# Patient Record
Sex: Female | Born: 1960 | Race: Black or African American | Hispanic: No | Marital: Single | Smoking: Never smoker
Health system: Southern US, Community
[De-identification: ages and names within clinical notes are randomized; demographics above are authoritative.]

## PROBLEM LIST (undated history)

## (undated) DIAGNOSIS — M1990 Unspecified osteoarthritis, unspecified site: Secondary | ICD-10-CM

## (undated) DIAGNOSIS — Z86018 Personal history of other benign neoplasm: Secondary | ICD-10-CM

## (undated) DIAGNOSIS — D509 Iron deficiency anemia, unspecified: Secondary | ICD-10-CM

## (undated) DIAGNOSIS — H9190 Unspecified hearing loss, unspecified ear: Secondary | ICD-10-CM

## (undated) DIAGNOSIS — K6282 Dysplasia of anus: Secondary | ICD-10-CM

## (undated) DIAGNOSIS — I10 Essential (primary) hypertension: Secondary | ICD-10-CM

## (undated) DIAGNOSIS — E119 Type 2 diabetes mellitus without complications: Secondary | ICD-10-CM

## (undated) DIAGNOSIS — E538 Deficiency of other specified B group vitamins: Secondary | ICD-10-CM

## (undated) DIAGNOSIS — Z8669 Personal history of other diseases of the nervous system and sense organs: Secondary | ICD-10-CM

## (undated) DIAGNOSIS — T7840XA Allergy, unspecified, initial encounter: Secondary | ICD-10-CM

## (undated) DIAGNOSIS — Z87898 Personal history of other specified conditions: Secondary | ICD-10-CM

## (undated) HISTORY — DX: Personal history of other specified conditions: Z87.898

## (undated) HISTORY — DX: Personal history of other diseases of the nervous system and sense organs: Z86.69

## (undated) HISTORY — PX: TONSILLECTOMY: SUR1361

## (undated) HISTORY — PX: MYOMECTOMY ABDOMINAL APPROACH: SUR870

## (undated) HISTORY — PX: LAPAROSCOPIC ASSISTED VAGINAL HYSTERECTOMY: SHX5398

## (undated) HISTORY — DX: Allergy, unspecified, initial encounter: T78.40XA

## (undated) HISTORY — PX: LEEP: SHX91

## (undated) HISTORY — DX: Unspecified hearing loss, unspecified ear: H91.90

## (undated) HISTORY — DX: Unspecified osteoarthritis, unspecified site: M19.90

## (undated) HISTORY — PX: CARDIOVASCULAR STRESS TEST: SHX262

## (undated) HISTORY — PX: COLONOSCOPY WITH ESOPHAGOGASTRODUODENOSCOPY (EGD): SHX5779

---

## 1980-04-01 HISTORY — PX: BUNIONECTOMY: SHX129

## 1999-12-24 ENCOUNTER — Other Ambulatory Visit: Admission: RE | Admit: 1999-12-24 | Discharge: 1999-12-24 | Payer: Self-pay | Admitting: *Deleted

## 2000-10-01 ENCOUNTER — Other Ambulatory Visit: Admission: RE | Admit: 2000-10-01 | Discharge: 2000-10-01 | Payer: Self-pay | Admitting: Obstetrics and Gynecology

## 2000-10-01 ENCOUNTER — Encounter (INDEPENDENT_AMBULATORY_CARE_PROVIDER_SITE_OTHER): Payer: Self-pay | Admitting: Specialist

## 2001-06-29 ENCOUNTER — Other Ambulatory Visit: Admission: RE | Admit: 2001-06-29 | Discharge: 2001-06-29 | Payer: Self-pay | Admitting: Obstetrics and Gynecology

## 2001-09-04 ENCOUNTER — Encounter (INDEPENDENT_AMBULATORY_CARE_PROVIDER_SITE_OTHER): Payer: Self-pay

## 2001-09-04 ENCOUNTER — Inpatient Hospital Stay (HOSPITAL_COMMUNITY): Admission: AD | Admit: 2001-09-04 | Discharge: 2001-09-06 | Payer: Self-pay | Admitting: Obstetrics and Gynecology

## 2002-07-23 ENCOUNTER — Other Ambulatory Visit: Admission: RE | Admit: 2002-07-23 | Discharge: 2002-07-23 | Payer: Self-pay | Admitting: Obstetrics and Gynecology

## 2002-07-30 ENCOUNTER — Ambulatory Visit (HOSPITAL_COMMUNITY): Admission: RE | Admit: 2002-07-30 | Discharge: 2002-07-30 | Payer: Self-pay | Admitting: Obstetrics and Gynecology

## 2002-07-30 ENCOUNTER — Encounter: Payer: Self-pay | Admitting: Obstetrics and Gynecology

## 2004-12-30 ENCOUNTER — Emergency Department: Payer: Self-pay | Admitting: Emergency Medicine

## 2005-07-02 ENCOUNTER — Ambulatory Visit: Payer: Self-pay | Admitting: Family Medicine

## 2005-07-19 ENCOUNTER — Ambulatory Visit (HOSPITAL_COMMUNITY): Admission: RE | Admit: 2005-07-19 | Discharge: 2005-07-19 | Payer: Self-pay | Admitting: Obstetrics & Gynecology

## 2005-08-13 ENCOUNTER — Ambulatory Visit: Payer: Self-pay | Admitting: Family Medicine

## 2005-09-18 ENCOUNTER — Encounter (INDEPENDENT_AMBULATORY_CARE_PROVIDER_SITE_OTHER): Payer: Self-pay | Admitting: *Deleted

## 2005-09-18 ENCOUNTER — Ambulatory Visit: Payer: Self-pay | Admitting: Family Medicine

## 2005-09-19 ENCOUNTER — Inpatient Hospital Stay (HOSPITAL_COMMUNITY): Admission: RE | Admit: 2005-09-19 | Discharge: 2005-09-20 | Payer: Self-pay | Admitting: Family Medicine

## 2005-10-08 ENCOUNTER — Ambulatory Visit: Payer: Self-pay | Admitting: Family Medicine

## 2005-11-01 ENCOUNTER — Ambulatory Visit: Payer: Self-pay | Admitting: Family Medicine

## 2005-11-04 ENCOUNTER — Ambulatory Visit: Payer: Self-pay | Admitting: Family Medicine

## 2005-11-12 ENCOUNTER — Ambulatory Visit: Payer: Self-pay | Admitting: Family Medicine

## 2005-11-18 ENCOUNTER — Ambulatory Visit: Payer: Self-pay | Admitting: Family Medicine

## 2006-02-10 ENCOUNTER — Ambulatory Visit: Payer: Self-pay | Admitting: Gynecology

## 2006-02-14 ENCOUNTER — Ambulatory Visit: Payer: Self-pay | Admitting: Family Medicine

## 2006-02-14 LAB — CONVERTED CEMR LAB: Hgb A1c MFr Bld: 6.8 %

## 2006-02-21 ENCOUNTER — Ambulatory Visit (HOSPITAL_COMMUNITY): Admission: RE | Admit: 2006-02-21 | Discharge: 2006-02-21 | Payer: Self-pay | Admitting: Family Medicine

## 2006-05-29 ENCOUNTER — Ambulatory Visit: Payer: Self-pay | Admitting: Family Medicine

## 2006-10-21 ENCOUNTER — Ambulatory Visit: Payer: Self-pay | Admitting: Family Medicine

## 2006-10-24 ENCOUNTER — Ambulatory Visit (HOSPITAL_COMMUNITY): Admission: RE | Admit: 2006-10-24 | Discharge: 2006-10-24 | Payer: Self-pay | Admitting: Gynecology

## 2007-02-04 ENCOUNTER — Encounter: Payer: Self-pay | Admitting: Family Medicine

## 2007-03-02 HISTORY — PX: NASAL ENDOSCOPY: SHX286

## 2007-03-19 ENCOUNTER — Ambulatory Visit: Payer: Self-pay | Admitting: Gastroenterology

## 2007-03-19 ENCOUNTER — Encounter: Payer: Self-pay | Admitting: Family Medicine

## 2007-07-30 ENCOUNTER — Ambulatory Visit: Payer: Self-pay | Admitting: Gynecology

## 2008-01-26 ENCOUNTER — Ambulatory Visit: Payer: Self-pay | Admitting: Family Medicine

## 2008-01-26 DIAGNOSIS — D509 Iron deficiency anemia, unspecified: Secondary | ICD-10-CM | POA: Insufficient documentation

## 2008-01-26 DIAGNOSIS — E119 Type 2 diabetes mellitus without complications: Secondary | ICD-10-CM

## 2008-03-28 ENCOUNTER — Ambulatory Visit: Payer: Self-pay | Admitting: Family Medicine

## 2008-03-29 LAB — CONVERTED CEMR LAB
ALT: 14 units/L (ref 0–35)
Albumin: 3.5 g/dL (ref 3.5–5.2)
Chloride: 110 meq/L (ref 96–112)
Eosinophils Absolute: 0 10*3/uL (ref 0.0–0.7)
Eosinophils Relative: 0.8 % (ref 0.0–5.0)
GFR calc Af Amer: 138 mL/min
GFR calc non Af Amer: 114 mL/min
HCT: 37.1 % (ref 36.0–46.0)
HDL: 49.5 mg/dL (ref >39.0)
Hemoglobin: 12.5 g/dL (ref 12.0–15.0)
Hgb A1c MFr Bld: 6.2 % — ABNORMAL HIGH (ref 4.6–6.0)
MCV: 82.8 fL (ref 78.0–100.0)
Monocytes Absolute: 0.4 10*3/uL (ref 0.1–1.0)
Monocytes Relative: 10.8 % (ref 3.0–12.0)
Neutro Abs: 2.1 10*3/uL (ref 1.4–7.7)
Phosphorus: 3.1 mg/dL (ref 2.3–4.6)
Potassium: 4.3 meq/L (ref 3.5–5.1)
Sodium: 141 meq/L (ref 135–145)
Total CHOL/HDL Ratio: 3.2
Total Protein: 6.4 g/dL (ref 6.0–8.3)
Triglycerides: 47 mg/dL (ref 0–149)
WBC: 4.1 10*3/uL — ABNORMAL LOW (ref 4.5–10.5)

## 2008-06-02 ENCOUNTER — Encounter: Payer: Self-pay | Admitting: Family Medicine

## 2008-06-16 ENCOUNTER — Encounter: Payer: Self-pay | Admitting: Family Medicine

## 2008-10-05 ENCOUNTER — Ambulatory Visit: Payer: Self-pay | Admitting: Family Medicine

## 2008-10-05 DIAGNOSIS — D518 Other vitamin B12 deficiency anemias: Secondary | ICD-10-CM | POA: Insufficient documentation

## 2008-10-05 DIAGNOSIS — J309 Allergic rhinitis, unspecified: Secondary | ICD-10-CM | POA: Insufficient documentation

## 2008-10-05 DIAGNOSIS — R209 Unspecified disturbances of skin sensation: Secondary | ICD-10-CM

## 2008-10-05 DIAGNOSIS — R51 Headache: Secondary | ICD-10-CM

## 2008-10-05 DIAGNOSIS — R350 Frequency of micturition: Secondary | ICD-10-CM | POA: Insufficient documentation

## 2008-10-05 DIAGNOSIS — D696 Thrombocytopenia, unspecified: Secondary | ICD-10-CM | POA: Insufficient documentation

## 2008-10-05 LAB — CONVERTED CEMR LAB
Glucose, Urine, Semiquant: NEGATIVE
Protein, U semiquant: NEGATIVE
Specific Gravity, Urine: 1.015
WBC Urine, dipstick: NEGATIVE
pH: 7

## 2008-10-06 LAB — CONVERTED CEMR LAB
Albumin: 3.6 g/dL (ref 3.5–5.2)
BUN: 13 mg/dL (ref 6–23)
Basophils Relative: 0.2 % (ref 0.0–3.0)
Calcium: 9 mg/dL (ref 8.4–10.5)
Eosinophils Absolute: 0 10*3/uL (ref 0.0–0.7)
Glucose, Bld: 101 mg/dL — ABNORMAL HIGH (ref 70–99)
Hemoglobin: 12.6 g/dL (ref 12.0–15.0)
MCHC: 32.8 g/dL (ref 30.0–36.0)
MCV: 85.5 fL (ref 78.0–100.0)
Monocytes Absolute: 0.5 10*3/uL (ref 0.1–1.0)
Neutro Abs: 1.8 10*3/uL (ref 1.4–7.7)
Neutrophils Relative %: 45.7 % (ref 43.0–77.0)
Potassium: 4.3 meq/L (ref 3.5–5.1)
RBC: 4.5 M/uL (ref 3.87–5.11)

## 2008-10-07 ENCOUNTER — Ambulatory Visit: Payer: Self-pay | Admitting: Family Medicine

## 2008-10-12 ENCOUNTER — Ambulatory Visit: Payer: Self-pay | Admitting: Oncology

## 2008-10-21 ENCOUNTER — Ambulatory Visit: Payer: Self-pay | Admitting: Family Medicine

## 2008-11-04 ENCOUNTER — Ambulatory Visit: Payer: Self-pay | Admitting: Family Medicine

## 2008-11-18 ENCOUNTER — Ambulatory Visit: Payer: Self-pay | Admitting: Family Medicine

## 2008-11-29 ENCOUNTER — Ambulatory Visit: Payer: Self-pay | Admitting: Oncology

## 2008-12-01 ENCOUNTER — Ambulatory Visit: Payer: Self-pay | Admitting: Family Medicine

## 2008-12-01 LAB — CMP (CANCER CENTER ONLY)
Albumin: 3.8 g/dL (ref 3.3–5.5)
CO2: 27 mEq/L (ref 18–33)
Calcium: 9.2 mg/dL (ref 8.0–10.3)
Chloride: 105 mEq/L (ref 98–108)
Glucose, Bld: 168 mg/dL — ABNORMAL HIGH (ref 73–118)
Potassium: 3.8 mEq/L (ref 3.3–4.7)
Sodium: 139 mEq/L (ref 128–145)
Total Bilirubin: 0.5 mg/dl (ref 0.20–1.60)
Total Protein: 7.2 g/dL (ref 6.4–8.1)

## 2008-12-01 LAB — CBC WITH DIFFERENTIAL (CANCER CENTER ONLY)
BASO%: 0.8 % (ref 0.0–2.0)
Eosinophils Absolute: 0.1 10*3/uL (ref 0.0–0.5)
MONO#: 0.3 10*3/uL (ref 0.1–0.9)
NEUT#: 2.8 10*3/uL (ref 1.5–6.5)
Platelets: 149 10*3/uL (ref 145–400)
RBC: 4.89 10*6/uL (ref 3.70–5.32)
RDW: 12.5 % (ref 10.5–14.6)
WBC: 5 10*3/uL (ref 3.9–10.0)

## 2008-12-01 LAB — MORPHOLOGY - CHCC SATELLITE

## 2008-12-09 LAB — PLT GLYCOPROTEIN (INDIRECT) AUTOABS
Plt GP Ia/IIa Autoabs: NOT DETECTED
Plt Glyco IIb/IIIA Autoabs: NOT DETECTED
Plt Glycoprotein Ib/IX Autoabs: NOT DETECTED

## 2008-12-09 LAB — VITAMIN B12: Vitamin B-12: >2000 pg/mL — ABNORMAL HIGH (ref 211–911)

## 2008-12-09 LAB — FOLATE: Folate: >20 ng/mL

## 2008-12-16 ENCOUNTER — Ambulatory Visit: Payer: Self-pay | Admitting: Family Medicine

## 2008-12-16 ENCOUNTER — Telehealth: Payer: Self-pay | Admitting: Family Medicine

## 2008-12-19 ENCOUNTER — Encounter: Payer: Self-pay | Admitting: Family Medicine

## 2008-12-19 ENCOUNTER — Ambulatory Visit: Payer: Self-pay | Admitting: Family Medicine

## 2009-05-26 ENCOUNTER — Ambulatory Visit: Payer: Self-pay | Admitting: Oncology

## 2009-07-06 ENCOUNTER — Ambulatory Visit: Payer: Self-pay | Admitting: Family Medicine

## 2009-07-07 ENCOUNTER — Telehealth: Payer: Self-pay | Admitting: Family Medicine

## 2010-04-10 ENCOUNTER — Ambulatory Visit
Admission: RE | Admit: 2010-04-10 | Discharge: 2010-04-10 | Payer: Self-pay | Source: Home / Self Care | Attending: Family Medicine | Admitting: Family Medicine

## 2010-04-10 ENCOUNTER — Telehealth: Payer: Self-pay | Admitting: Family Medicine

## 2010-04-10 DIAGNOSIS — K130 Diseases of lips: Secondary | ICD-10-CM | POA: Insufficient documentation

## 2010-04-10 LAB — CONVERTED CEMR LAB
Blood in Urine, dipstick: NEGATIVE
Glucose, Urine, Semiquant: NEGATIVE
Ketones, urine, test strip: NEGATIVE
Specific Gravity, Urine: 1.02
WBC Urine, dipstick: NEGATIVE
pH: 6

## 2010-04-27 ENCOUNTER — Emergency Department: Payer: Self-pay | Admitting: Emergency Medicine

## 2010-05-01 NOTE — Assessment & Plan Note (Signed)
Summary: sinus infection/alc   Vital Signs:  Patient profile:   50 year old female Height:      64 inches Weight:      196.38 pounds BMI:     33.83 Temp:     98.8 degrees F oral Pulse rate:   88 / minute Pulse rhythm:   regular BP sitting:   122 / 82  (left arm) Cuff size:   regular  Vitals Entered By: Delilah Shan CMA Duncan Dull) (July 06, 2009 3:00 PM) CC: ? sinus infection   History of Present Illness: 50 yo with allergic rhinitis with over 2 weeks of worsening congestion and sinus pressure. Taking sinus excedrin and Mucinex, not helping. Felt feverish last night. Has dry cough. No wheezing or shortness of breath. Lips are chapped which is really bothering her, no ulcers or lesions on her lips.  Current Medications (verified): 1)  Flonase 50 Mcg/act Susp (Fluticasone Propionate) .... 2 Sprays in Each Nostril Daily 2)  Augmentin 875-125 Mg Tabs (Amoxicillin-Pot Clavulanate) .Marland Kitchen.. 1 By Mouth 2 Times Daily X 10 Days  Allergies (verified): No Known Drug Allergies  Review of Systems      See HPI General:  Complains of chills and fever. ENT:  Complains of nasal congestion, postnasal drainage, sinus pressure, and sore throat. CV:  Denies chest pain or discomfort. Resp:  Complains of cough; denies shortness of breath, sputum productive, and wheezing.  Physical Exam  General:  overweight but generally well appearing  Nose:  no nasal discharge.  - nares are boggy erythema right>left +sinus  Mouth:  pharyngeal erythema.   Lungs:  Normal respiratory effort, chest expands symmetrically. Lungs are clear to auscultation, no crackles or wheezes. Heart:  Normal rate and regular rhythm. S1 and S2 normal without gallop, murmur, click, rub or other extra sounds. Psych:  normal affect, talkative and pleasant - does not seem anxious or stressed    Impression & Recommendations:  Problem # 1:  ACUTE SINUSITIS, UNSPECIFIED (ICD-461.9) Assessment New Given duration and progression of  symptoms, will treat with augmentin. Continue Flonase daily. Ibuprofen as needed. See pt instructions for details. Her updated medication list for this problem includes:    Flonase 50 Mcg/act Susp (Fluticasone propionate) .Marland Kitchen... 2 sprays in each nostril daily    Augmentin 875-125 Mg Tabs (Amoxicillin-pot clavulanate) .Marland Kitchen... 1 by mouth 2 times daily x 10 days  Complete Medication List: 1)  Flonase 50 Mcg/act Susp (Fluticasone propionate) .... 2 sprays in each nostril daily 2)  Augmentin 875-125 Mg Tabs (Amoxicillin-pot clavulanate) .Marland Kitchen.. 1 by mouth 2 times daily x 10 days  Patient Instructions: 1)  Take antibiotic as directed.  Drink lots of fluids.  Treat sympotmatically with Mucinex, nasal saline irrigation, and Tylenol/Ibuprofen. Cough suppressant at night. Call if not improving as expected in 5-7 days.  Prescriptions: AUGMENTIN 875-125 MG TABS (AMOXICILLIN-POT CLAVULANATE) 1 by mouth 2 times daily x 10 days  #20 x 0   Entered and Authorized by:   Ruthe Mannan MD   Signed by:   Ruthe Mannan MD on 07/06/2009   Method used:   Electronically to        [id number] ([facility]ail[facility]       [address]       [place], [place]  [address]       Ph: [phone] or [phone]       Fax: [phone]   RxID:  [id number]   Current Allergies (reviewed today): No known allergies

## 2010-05-01 NOTE — Progress Notes (Signed)
Summary: ? allergic reaction  Phone Note Call from Patient Call back at [phone]   Caller: Patient Call For: Dr. Dayton Martes Summary of Call: Pt was seen yesterday for sinus infection and was given augmentin.  She states she has whelpy itchy rash this morning.  Please send something else to [facility].  I will update allergy list. Initial call taken by: Lowella Petties CMA,  July 07, 2009 9:18 AM   New Allergies: ! AUGMENTIN New/Updated Medications: AZITHROMYCIN 250 MG  TABS (AZITHROMYCIN) 2 by  mouth today and then 1 daily for 4 days New Allergies: ! AUGMENTINPrescriptions: AZITHROMYCIN 250 MG  TABS (AZITHROMYCIN) 2 by  mouth today and then 1 daily for 4 days  #6 x 0   Entered and Authorized by:   Ruthe Mannan MD   Signed by:   Ruthe Mannan MD on 07/07/2009   Method used:   [facility]        [id number]       [address]       [place], [place]  [address]       Ph: [phone] or [phone]       Fax: [phone]   RxID:   [id number]   Prior Medications: FLONASE 50 MCG/ACT SUSP (FLUTICASONE PROPIONATE) 2 sprays in each nostril daily Current Allergies: ! AUGMENTIN

## 2010-05-03 NOTE — Assessment & Plan Note (Signed)
Summary: COLD,?UTI/CLE   Vital Signs:  Patient profile:   50 year old female Height:      64 inches Weight:      200.50 pounds BMI:     34.54 Temp:     98.6 degrees F oral Pulse rate:   84 / minute Pulse rhythm:   regular BP sitting:   124 / 92  (left arm) Cuff size:   large  Vitals Entered By: Delilah Shan CMA Jamile Sivils Dull) (April 10, 2010 8:10 AM) CC: Cold.   ? UTI   History of Present Illness: No dysuria now.  Prev with UTI as below.  No symptoms now.  Ua unremarkable today.  Went to [facility] derm clinic for rash on arms/hives this summer.  Was found to have UTI and was started on septra.  Per patient, ANA, TSH, CBC, CMET was normal. Was asking if she still had a uti.    Chapped lips:  Lips started getting chapped back in the summer.  Started on aquaphor samples for chapped lips w/o relief per derm.   This episode of lip irritation started last month.  Lips prev cracked. No ulcers.  Diffusely tender to palpation.  Uses avon lipstick- changed lipstick w/o relief.  Taking flonase occ, no other new medicines except for allegra.  No FCNAVD.  No tongue changes, no oral changes o/w.  Swallowing well.  No other family members with similar symptoms.    Allergies: 1)  ! Augmentin  Review of Systems       See HPI.  Otherwise negative.    Physical Exam  General:  no apparent distress normocephalic atraumatic tm wnl, nasal exam wnl op wnl lips slighlty tender- diffusely, but no ulceration or focal change other than cracking in the lateral folds bilaterally.  no fluctuance. neck w/o la    Impression & Recommendations:  Problem # 1:  DISEASES OF LIPS (ICD-528.5) Unclear source.  I d/w patient's primary and advised her to follow up with derm and keep a log of symptoms/potential triggers in the meantime.  She understood.  No indication of current uti. follow up as needed.    Complete Medication List: 1)  Flonase 50 Mcg/act Susp (Fluticasone propionate) .... 2 sprays in each nostril  daily  Other Orders: UA Dipstick w/o Micro (manual) (04540)  Patient Instructions: 1)  I'll see if I can come up with any reasons for your chapped lips.  I would keep a log of symptoms to see if you can notice any patterns.  I would check back with the skin clinic.     Orders Added: 1)  Est. Patient Level III [98119] 2)  UA Dipstick w/o Micro (manual) [81002]    Current Allergies (reviewed today): ! AUGMENTIN  Laboratory Results   Urine Tests  Date/Time Received: April 10, 2010 8:49 AM   Routine Urinalysis   Color: yellow Appearance: Clear Glucose: negative   (Normal Range: Negative) Bilirubin: negative   (Normal Range: Negative) Ketone: negative   (Normal Range: Negative) Spec. Gravity: 1.020   (Normal Range: 1.003-1.035) Blood: negative   (Normal Range: Negative) pH: 6.0   (Normal Range: 5.0-8.0) Protein: negative   (Normal Range: Negative) Urobilinogen: 0.2   (Normal Range: 0-1) Nitrite: negative   (Normal Range: Negative) Leukocyte Esterace: negative   (Normal Range: Negative)

## 2010-05-03 NOTE — Progress Notes (Signed)
  Phone Note Outgoing Call   Summary of Call: please call patient.  I d/w Dr. Milinda Antis and we both think it would be reasonable to follow up with derm if lip symptoms continue.  thanks. Crawford Givens MD  April 10, 2010 8:48 PM   Follow-up for Phone Call        Left message on voicemail  in detail.  Personalized VM. Lugene Fuquay CMA Zamari Vea Dull)  April 11, 2010 9:55 AM

## 2010-06-04 ENCOUNTER — Ambulatory Visit (INDEPENDENT_AMBULATORY_CARE_PROVIDER_SITE_OTHER): Payer: 59 | Admitting: Family Medicine

## 2010-06-04 ENCOUNTER — Encounter: Payer: Self-pay | Admitting: Family Medicine

## 2010-06-04 DIAGNOSIS — J0100 Acute maxillary sinusitis, unspecified: Secondary | ICD-10-CM

## 2010-06-04 DIAGNOSIS — J309 Allergic rhinitis, unspecified: Secondary | ICD-10-CM

## 2010-06-04 DIAGNOSIS — R030 Elevated blood-pressure reading, without diagnosis of hypertension: Secondary | ICD-10-CM | POA: Insufficient documentation

## 2010-06-09 ENCOUNTER — Ambulatory Visit: Payer: 59 | Admitting: Family Medicine

## 2010-06-12 NOTE — Assessment & Plan Note (Signed)
Summary: sinus problem   Vital Signs:  Patient Profile:   49 Years Old Female CC:      Sinus Pressure and Drainage Height:     64 inches Weight:      200 pounds BMI:     34.45 O2 Sat:      99 % O2 treatment:    Room Air Temp:     98.2 degrees F oral Pulse rate:   87 / minute Pulse rhythm:   regular Resp:     13 per minute BP sitting:   141 / 91  (left arm) Cuff size:   large  Vitals Entered By: Standley Dakins MD (June 04, 2010 4:53 PM)                  Current Allergies (reviewed today): ! AUGMENTINHistory of Present Illness History from: patient Reason for visit: see chief complaint Chief Complaint: Sinus Pressure and Drainage History of Present Illness: The patient presented today for evaluation of sinus drainage and nasal congestion for 5 days getting worse. She is having greenish, foul-tasting discharge from her sinuses;  She is having mild sinus maxillary headaches; She reports now decreased appetite.  She is having dry cough but no wheezing.  She has been using her flonase but started loading up on OTC decongestants and believes that this may have contributed to her elevated bp when normally she does not have a problem.  She denies CP.  She denies weakness.  She has had some sneezing.    REVIEW OF SYSTEMS Constitutional Symptoms      Denies fever, chills, night sweats, weight loss, weight gain, and fatigue.  Eyes       Denies change in vision, eye pain, eye discharge, glasses, contact lenses, and eye surgery. Ear/Nose/Throat/Mouth       Complains of sinus problems and sore throat.      Denies hearing loss/aids, change in hearing, ear pain, ear discharge, dizziness, frequent runny nose, frequent nose bleeds, hoarseness, and tooth pain or bleeding.  Respiratory       Complains of dry cough.      Denies productive cough, wheezing, shortness of breath, asthma, bronchitis, and emphysema/COPD.  Cardiovascular       Denies murmurs, chest pain, and tires easily with  exhertion.    Gastrointestinal       Denies stomach pain, nausea/vomiting, diarrhea, constipation, blood in bowel movements, and indigestion. Genitourniary       Denies painful urination, kidney stones, and loss of urinary control. Neurological       Complains of headaches.      Denies paralysis, seizures, and fainting/blackouts. Musculoskeletal       Denies muscle pain, joint pain, joint stiffness, decreased range of motion, redness, swelling, muscle weakness, and gout.  Skin       Denies bruising, unusual mles/lumps or sores, and hair/skin or nail changes.  Psych       Denies mood changes, temper/anger issues, anxiety/stress, speech problems, depression, and sleep problems.  Past History:  Family History: Last updated: 06/04/2010 Mother - D - Age 52 Father Alive age 78 Sister - Unknown Health Status  Social History: Last updated: 06/04/2010 non smoker No ETOH Denies Recreational Drug Useage  Past Medical History: Reviewed history from 10/05/2008 and no changes required. Labile BP High frequency heg loss DM 2  gyn- [facility]   Past Surgical History: Reviewed history from 01/26/2008 and no changes required. colonoscopy 12/08 neg endoscopy neg 12/08  (08/2005) Hyst (partial ) (  2000) flex sig  Family History: Mother - D - Age 43 Father Alive age 40 Sister - Unknown Health Status  Social History: non smoker No ETOH Denies Recreational Drug Useage Physical Exam General appearance: well developed, well nourished, no acute distress Head: normocephalic, atraumatic Eyes: conjunctivae and lids normal Pupils: equal, round, reactive to light Ears: normal, no lesions or deformities Nasal: swollen red turbinates with congestion Oral/Pharynx: pharyngeal erythema without exudate, uvula midline without deviation Neck: neck supple,  trachea midline, no masses Chest/Lungs: no rales, wheezes, or rhonchi bilateral, breath sounds equal without effort Heart: regular rate and   rhythm, no murmur Extremities: normal extremities Neurological: grossly intact and non-focal Skin: no obvious rashes or lesions MSE: oriented to time, place, and person Assessment New Problems: ELEVATED BLOOD PRESSURE (ICD-796.2) SINUSITIS, MAXILLARY, ACUTE (ICD-461.0)   Patient Education: Patient and/or caregiver instructed in the following: rest, Tylenol prn. The risks, benefits and possible side effects were clearly explained and discussed with the patient.  The patient verbalized clear understanding.  The patient was given instructions to return if symptoms don't improve, worsen or new changes develop.  If it is not during clinic hours and the patient cannot get back to this clinic then the patient was told to seek medical care at an available urgent care or emergency department.  The patient verbalized understanding.   Demonstrates willingness to comply.  Plan New Medications/Changes: CETIRIZINE HCL 10 MG TABS (CETIRIZINE HCL) take 1 by mouth daily for allergies  #30 x 0, 06/04/2010, Clanford Johnson MD FLUTICASONE PROPIONATE 50 MCG/ACT SUSP (FLUTICASONE PROPIONATE) 2 sprays per nostril once daily  #1 x 1, 06/04/2010, Clanford Johnson MD DOXYCYCLINE MONOHYDRATE 100 MG TABS (DOXYCYCLINE MONOHYDRATE) take 1 by mouth two times a day with food until completed  #20 x 0, 06/04/2010, Clanford Johnson MD  Follow Up: Follow up in 2-3 days if no improvement, Follow up on an as needed basis, Follow up with Primary Physician  The patient and/or caregiver has been counseled thoroughly with regard to medications prescribed including dosage, schedule, interactions, rationale for use, and possible side effects and they verbalize understanding.  Diagnoses and expected course of recovery discussed and will return if not improved as expected or if the condition worsens. Patient and/or caregiver verbalized understanding.  Prescriptions: CETIRIZINE HCL 10 MG TABS (CETIRIZINE HCL) take 1 by mouth daily for  allergies  #30 x 0   Entered and Authorized by:   Standley Dakins MD   Signed by:   Standley Dakins MD on 06/04/2010   Method used:   [facility]        [id number]       [address]       [place], [place]  [address]       Ph: [phone] or [phone]       Fax: [phone]   RxID:   [id number] FLUTICASONE PROPIONATE 50 MCG/ACT SUSP (FLUTICASONE PROPIONATE) 2 sprays per nostril once daily  #1 x 1   Entered and Authorized by:   Standley Dakins MD   Signed by:   Standley Dakins MD on 06/04/2010   Method used:   [facility]        [id number]       [address] [facility], [place]  [address]  Ph: [phone] or [phone]       Fax: [phone]   RxID:   [id number] DOXYCYCLINE MONOHYDRATE 100 MG TABS (DOXYCYCLINE MONOHYDRATE) take 1 by mouth two times a day with food until completed  #20 x 0   Entered and Authorized by:   Standley Dakins MD   Signed by:   Standley Dakins MD on 06/04/2010   Method used:   [facility]        [id number]       [address]       [place], [place]  [address]       Ph: [phone] or [phone]       Fax: [phone]   RxID:   [id number]   Patient Instructions: 1)  Instructions given to patient verbally: The patient verbalized clear understanding.   2)  Go to the pharmacy and pick up your prescription (s).  It may take up to 30 mins for electronic prescriptions to be delivered to the pharmacy.  Please call if your pharmacy has not received your prescriptions after 30 minutes.   3)  The patient was informed that there is no on-call provider or services available at this clinic during off-hours (when the clinic is closed).  If the patient developed a problem or concern that required immediate attention, the patient was advised to go the the nearest available urgent care or emergency  department for medical care.  The patient verbalized understanding.   4)  Take your antibiotic as prescribed until ALL of it is gone, but stop if you develop a rash or swelling and contact our office as soon as possible. 5)  Acute sinusitis symptoms for less than 10 days are not helped by antibiotics.Use warm moist compresses, and over the counter decongestants (only as directed). Call if no improvement in 5-7 days, sooner if increasing pain, fever, or new symptoms. 6)  Take your allergy  medications regularly to help keep the sinuses clear and less infections.  7)  Return or go to the ER if no improvement or symptoms getting worse.   8)  See your PCP in 10 days to have your allergies and sinuses rechecked.   9)  Check your Blood Pressure regularly. If it is above: 140/90 you should make an appointment.

## 2010-07-03 NOTE — Letter (Signed)
Summary: History Form  History Form   Imported By: Eugenio Hoes 06/25/2010 10:24:59  _____________________________________________________________________  External Attachment:    Type:   Image     Comment:   External Document

## 2010-08-14 NOTE — Assessment & Plan Note (Signed)
[name], MOURER          ACCOUNT NO.:  [id number]   MEDICAL RECORD NO.:  [id number]          PATIENT TYPE:  POB   LOCATION:  [facility]         FACILITY:  [facility]   PHYSICIAN:  Tinnie Gens, MD        DATE OF BIRTH:  [birth year]   DATE OF SERVICE:                                  CLINIC NOTE   CHIEF COMPLAINT:  Yearly exam.   HISTORY OF PRESENT ILLNESS:  The patient is a 50 year old nullipara who  underwent LAVH by me in 2007 for menorrhagia of fibroid uterus.  Since  that time, the patient has had some vaginal dryness especially with  intercourse.  She does not have any hot flashes.  The patient reports  occasional itch which she thinks might be related to a yeast infection.  The patient is normally seen by her primary care physician, Dr. [name] at [facility].  The patient had recent blood work by her  which shows her to be anemic.  She is on B12 shots presently for that.  The patient has also become diabetic.  She is on diet alone for  treatment of that.  The patient saw her eye doctor this morning and has  had headaches for approximately the last 2 days.   PAST MEDICAL HISTORY:  Significant for diabetes and borderline  hypertension.   PAST SURGICAL HISTORY:  Myomectomy, bunionectomy, laparoscopically-  assisted vaginal hysterectomy.   ALLERGIES:  None known.   MEDICATIONS:  Ibuprofen 800 mg as needed and vitamin B12 shots q.2  weeks.   OBSTETRICAL HISTORY:  She is a nullipara.   GYNECOLOGIC HISTORY:  Has a history of LEEP in the past, in  approximately 2008.  Last mammogram was in 2009 in November was normal.   FAMILY HISTORY:  Significant for diabetes and hypertension.  No GYN or  ovarian cancer.   SOCIAL HISTORY:  She works as the [employer].  She denies tobacco or  drug use.  She is a social alcohol user.   REVIEW OF SYSTEMS:  A 14-point review of systems is reviewed.  Positive  for vaginal itching and vaginal dryness for  headaches.  She denies  breast mass, shortness of breath, chest pain, abdominal pain, nausea,  vomiting, diarrhea, constipation, blood in her stool, blood in her  urine.   PHYSICAL EXAMINATION:  VITAL SIGNS:  Her blood pressure is 119/76,  weight is 192.  GENERAL:  She is moderately obese female, in no acute distress.  HEENT:  Normocephalic, atraumatic.  Sclerae anicteric.  NECK:  Supple.  Normal thyroid.  LUNGS:  Clear bilaterally.  CV:  Regular rate and rhythm without rubs, gallops, murmurs.  ABDOMEN:  Soft, nontender, nondistended.  EXTREMITIES:  No cyanosis, clubbing, or edema.  BREASTS:  Symmetric with everted nipples.  No masses.  No  supraclavicular or axillary adenopathy.  GU:  Normal external female genitalia.  BUS normal.  Vagina is pink and  rugated.  Cervix and uterus are surgically absent.  There is no adnexal  mass or tenderness.   IMPRESSION:  Yearly exam with Pap, history of LEEP in 1998, previous  hysterectomy for fibroid uterus and menorrhagia.   PLAN:  Pap  smear today as the patient has a history of abnormal Pap in  the past.  We will go for approximately 20 years and if all these Paps  are normal, she can stop having Pap smears.   RECOMMENDATIONS:  Yearly mammograms.  Follow up blood work with PCP as  needed.           ______________________________  Tinnie Gens, MD     TP/MEDQ  D:  12/19/2008  T:  12/20/2008  Job:  [id number]   cc:   Marne A. Milinda Antis, MD

## 2010-08-17 NOTE — Op Note (Signed)
[name], [name]          ACCOUNT NO.:  [id number]   MEDICAL RECORD NO.:  [id number]          PATIENT TYPE:  OIB   LOCATION:  9399                          [facility]   PHYSICIAN:  Tanya S. Shawnie Pons, M.D.   DATE OF BIRTH:  08/10/1960   DATE OF PROCEDURE:  09/18/2005  DATE OF DISCHARGE:                                 OPERATIVE REPORT   PREOPERATIVE DIAGNOSES:  1.  Fibroid uterus.  2.  Menorrhagia.  3.  Previous myomectomy.   POSTOPERATIVE DIAGNOSES:  1.  Fibroid uterus.  2.  Menorrhagia.  3.  Previous myomectomy.   PROCEDURE:  Laparoscopically-assisted vaginal hysterectomy.   SURGEON:  Shelbie Proctor. Shawnie Pons, M.D.   ASSISTANT:  Ginger Carne, M.D.   ANESTHESIA:  General and local, Belva Agee, M.D.   SPECIMENS:  Uterus and cervix.   ESTIMATED BLOOD LOSS:  250 mL.   COMPLICATIONS:  None.   REASON FOR PROCEDURE:  Briefly, the patient is a 50 year old nulligravida  who has had a history of a myomectomy, who continues to have heavy bleeding  despite being on oral contraceptives.  The patient misses a lot of work  related to heavy periods and now desires permanent treatment.   PROCEDURE:  The patient was taken to the OR.  She was placed in dorsal  lithotomy in Butler stirrups.  She was prepped and draped in the usual  sterile fashion.  A Foley catheter was placed inside the bladder.  The  cervix was visualized with a speculum, grasped the anterior lip with a  single-tooth tenaculum, and then a Pelosi uterine manipulator was inserted  into the uterus.  Attention was then turned to the abdomen, where injection  was made at the umbilicus; however, a hernia was noted there so an incision  was made approximately 3 cm to the left and 3 cm superior to the umbilicus.  A Veress needle was inserted into the abdomen and a pneumoperitoneum was  created.  A 10 mm trocar was then placed through this incision without  difficulty.  Following this, the laparoscope was introduced  to the abdomen.  The uterus was visualized.  The ovaries appeared normal.  There was some  adhesion of the patient's left tube and ovary to the sigmoid colon.  The  tubo-ovarian pedicles were then taken down with the bipolar cautery,  followed by sharp ligation bilaterally to the level of the uterine arteries.  Everything was removed from the abdomen except the trocars and then the  attention was turned to below.  The uterine manipulator was removed and the  cervix was grasped with a double-tooth tenaculum anteriorly and posteriorly  and injected with 10 mL of 0.5% Marcaine with epinephrine.  An incision was  made with a knife about the cervix to take the vagina off the cervix.  There  was a significant amount of bleeding noted at this stage and so the cervix  was regrasped with a separate clamp.  There was some difficulty in getting  an anterior leaf, so an incision was made.  A posterior colpotomy was made.  A long weighted speculum was placed  inside here and the posterior peritoneum  was tagged to the vaginal cuff.  Zeppelin clamps were then used to clamp the  uterosacral ligaments bilaterally.  These were cut and suture ligated.  The  uterosacrals were attached to the vaginal wall at the time of ligation.  The  uterine arteries were sequentially clamped with straight Zeppelin and suture  ligated.  An anterior colpotomy was then obtained easily and the remainder  of the broad ligaments were taken down easily in one bite.  The uterus was  removed with the cervix intact and the Freestyle was placed about both the  upper pedicles, followed by a suture ligature.  The pedicles were inspected  and there was some bleeding at the right uterine artery, and a right angle  was used to grasp the artery alone, and this was suture ligated.  Good  hemostasis was noted.  There was some bleeding from the vaginal cuff, and  the vaginal cuff was closed in a horizontal running stitch.  Attention was  then  returned to the abdomen.  Any bleeders were cauterized from above and  all instruments were removed from the abdomen.  Pneumoperitoneum was let  down and the fascia closed at the 10 mm port and the skin closed using  subcuticular 4-0 Vicryl in a subcuticular fashion at the 10 mm port and at  both 5 mm ports.  If I did not say that, those three ports were put in under  direct visualization on the patient's left hand side just after getting the  10 mm port in.  All instrument, needle and lap counts were correct x2.  The  patient was awakened and taken to the recovery room in stable condition.           ______________________________  Shelbie Proctor Shawnie Pons, M.D.     TSP/MEDQ  D:  09/18/2005  T:  09/18/2005  Job:  [id number]

## 2010-08-17 NOTE — Discharge Summary (Signed)
[facility]  Patient:    [name], [name] Visit Number: [id number] MRN: [id number]          Service Type: GYN Location: 9300 [id number] Attending Physician:  Esmeralda Arthur Dictated by:   Henreitta Leber, P.A.-C. Admit Date:  09/04/2001 Discharge Date: 09/06/2001                             Discharge Summary  DISCHARGE DIAGNOSES:              1. Symptomatic uterine fibroids.                                   2. Pelvic adhesions.                                   3. Bilateral tubal occlusion.                                   4. Anemia.                                   5. B12 deficiency.  OPERATION:                        On the date of admission, the patient underwent an abdominal myomectomy with lysis of adhesions and chromopertubation tolerating all procedures well.  The patient was found to have multiple fibroids within her uterus (10) with the largest measuring approximately 4 cm; bilateral tubal adhesions and normal-appearing ovaries.  HISTORY OF PRESENT ILLNESS:       The patient is a 50 year old African-American female gravida 0 with a history of heavy menstrual periods which have progressively increased along with dysmenorrhea.  The patient is known to have uterine fibroids which on sonohistogram in April 2003 revealed a uterine size of 9.2 x 4.8 x 5.6 cm with at least 4 measurable fibroids with the largest located posterior mid-uterus measuring 4 cm.  The patient also wishes to preserve her reproductive potential and therefore has consented for an abdominal myomectomy.  Please see the patients dictated history and physical examination for details.  PHYSICAL EXAMINATION:  VITAL SIGNS:                      Blood pressure 120/80, weight 198 pounds.  GENERAL:                          Within normal limits.  PELVIC:                           Normal vulva and vagina.  Normal cervix. Uterus is increased in size, irregular, mobile, and  compatible with 10- to 12-week size.  Both adnexa are normal.  HOSPITAL COURSE:                  On the date of admission, the patient underwent aforementioned procedures tolerating them all well.  Her postoperative course was unremarkable (postop hemoglobin 7.7 compared with a preop hemoglobin of 9.1).  The patient quickly  resumed bowel and bladder function by postop day #2 and was deemed ready for discharge home.  DISCHARGE MEDICATIONS:            1. Tylox one to two tablets every 4-6 hours                                      as needed for pain.                                   2. Ibuprofen 600 mg one tablet with food                                      every 6 hours for 5 days then as needed                                      for pain.                                   3. Phenergan 25 mg one tablet four times                                      daily as needed for nausea.                                   4. Stool softeners 100 mg twice daily until                                      bowel movements are regular.                                   5. Iron 325 mg twice daily for 6 weeks.  FOLLOWUP:                         The patient was instructed to call [facility] for 6-week postoperative exam with Dr. Estanislado Pandy.  POSTOPERATIVE INSTRUCTIONS:       The patient was given a copy of [facility] postoperative instruction sheet.  She was further advised to avoid driving for 2 weeks, no lifting at all for 4 weeks, no intercourse for 6 weeks.  DIET:                             The patients diet was without restriction.  FINAL PATHOLOGY:                  Uterine fibroid: Multiple benign leiomyomata (55 g). Dictated by:   Henreitta Leber, P.A.-C. Attending Physician:  Esmeralda Arthur DD:  10/06/01 TD:  10/09/01 Job: [id number]

## 2010-08-17 NOTE — Discharge Summary (Signed)
[name], RHUE          ACCOUNT NO.:  [id number]   MEDICAL RECORD NO.:  [id number]          PATIENT TYPE:  INP   LOCATION:  9318                          [facility]  [facility]   PHYSICIAN:  Tanya S. Shawnie Pons, M.D.   DATE OF BIRTH:  Jul 24, 1960   DATE OF ADMISSION:  09/18/2005  DATE OF DISCHARGE:  09/20/2005                                 DISCHARGE SUMMARY   PRIMARY DIAGNOSES:  1.  Fibroid uterus.  2.  Menorrhagia.   PROCEDURES:  Laparoscopically assisted vaginal hysterectomy.   PERTINENT LABORATORY DATA:  Preoperative hemoglobin 12.8, postoperative  hemoglobin 11.9.  Blood type is O negative.   REASON FOR ADMISSION:  Briefly, please see full H and P on the chart, the  patient is a 50 year old nulligravida with history of previous myomectomy  with heavy bleeding, despite being on oral contraceptives and continued  fibroid uterus who desired permanent treatment.   HOSPITAL COURSE:  The patient was admitted on the day of the procedure and  underwent the above-stated operation.  The patient, postoperatively, was  transferred to the floor.  She had a Foley catheter in over night and was on  PCA for pain.  Postoperative day #1 the patient was afebrile. Her Foley  catheter was discontinued.  She was voiding well.  She continued to have  some decreased appetite and lethargy and on that date her PCA was  discontinued as well as her intravenous. She was ambulating and her diet was  increased gradually.  On postoperative day #2 she was passing flatus,  feeling well. She denied any nausea or vomiting.  She was tolerating p.o.  without difficulty and remained afebrile.  At that time it was felt she was  stable for discharge.   DISPOSITION AND CONDITION:  The patient was discharged home in good  condition.   DISCHARGE INSTRUCTIONS:  Discharge instructions include return with fever  greater than 101 or persistent nausea and vomiting, heavy vaginal bleeding.  Patient is to be on pelvic rest  for the next six weeks.   FOLLOWUP:  Followup will be at the [facility] in two weeks.   DISCHARGE MEDICATIONS:  1.  Percocet 5/325 one to two p.o. q.4-6h. p.r.n. pain, #48, no refills.  2.  She will continue her inhaler, her ibuprofen, Sudafed and Imitrex that      she takes on an as needed basis.  3.  Patient will stop oral contraceptives at this time.   Pathologic review is not available at the time of discharge.           ______________________________  Shelbie Proctor. Shawnie Pons, M.D.     TSP/MEDQ  D:  09/20/2005  T:  09/20/2005  Job:  [id number]

## 2010-08-17 NOTE — Group Therapy Note (Signed)
NAMETIMIKO, OFFUTT NO.:  [id number]   MEDICAL RECORD NO.:  [id number]          PATIENT TYPE:  POB   LOCATION:  [facility]                   FAC[facility]  [facility]   PHYSICIAN:  Tinnie Gens, MD        DATE OF BIRTH:  1960-07-20   DATE OF SERVICE:                                    CLINIC NOTE   Patient seen at [facility].   CHIEF COMPLAINT:  Post op visit.   HISTORY OF PRESENT ILLNESS:  The patient is a 50 year old, nulligravida who  underwent TVH on 09/18/05.  Since that time her recovery has been very well  except for in the past few days she has had increasing pain that is low and  central and worse with bowel movements.  She is using a stool softener.  She  is also complaining of a little bit of vaginal discharge with odor and some  itching around the vulva.  The patient denies dysuria, nausea, vomiting,  fevers, chills.   PHYSICAL EXAMINATION:  VITALS:  Are as noted on the chart.  GENERAL:  A well-developed, well-nourished white female in no acute  distress.  ABDOMEN:  Soft.  Nontender.  Nondistended.  GU:  Normal external female genitalia.  The vagina is pink and rugated.  The  vagina is healing nicely.  Sutures are still visible.  There is no mass at  the cuff and no significant tenderness there either.  UA reveals just 1+  leukocytes after the full 2 minutes but was otherwise negative.   IMPRESSION:  1.  Post op check.  Doing well.  2.  Low pelvic pain, unclear etiology.  Question just healing.   PLAN:  If this pain does not improve next week, she should come back in.  Otherwise we will see her back in 3 weeks to release her back to regular  activity.           ______________________________  Tinnie Gens, MD     TP/MEDQ  D:  10/08/2005  T:  10/08/2005  Job:  [id number]

## 2010-08-17 NOTE — H&P (Signed)
NAMEANNAJULIA, [name]          ACCOUNT NO.:  [id number]   MEDICAL RECORD NO.:  [id number]          PATIENT TYPE:  AMB   LOCATION:  [facility]                    [facility]   PHYSICIAN:  Tinnie Gens, MD        DATE OF BIRTH:  Jul 27, 1960   DATE OF SERVICE:                             PRE-OP HISTORY & PHYSICAL   CHIEF COMPLAINT:  Fibroid uterus and menorrhagia.   HISTORY OF PRESENT ILLNESS:  The patient is a 50 year old nulligravida who  has a long history of fibroids and menorrhagia.  She has undergone  myomectomy in 2003, and has been on the pill ever since.  The patient has  increasing bleeding that is heavy and disrupts her work.  She is having to  change pads, as well as tampons, every 30-45 minutes.  The patient has a lot  of pain and headaches with her cycles, and she has been on pills fairly  constantly up until now.  The patient had previously desired to not have a  hysterectomy if possible, but has since decided that this would be the best  thing for her.   PAST MEDICAL HISTORY:  1.  Borderline high blood pressure.  2.  Borderline diabetes.   PAST SURGICAL HISTORY:  1.  Myomectomy.  2.  Bunionectomy.   ALLERGIES:  None known.   MEDICATIONS:  Loestrin one pill p.o. daily.   OBSTETRICAL HISTORY:  G0.   GYNECOLOGIC HISTORY:  Cycles are regular on the pill.  History of LEEP in  the past.  Last Pap in 2006 was normal.  Last mammogram in July of 2006 was  also normal.   FAMILY HISTORY:  Significant for diabetes and hypertension.   SOCIAL HISTORY:  The patient works at a [employer].  Denies tobacco or  drug use.  She is a social drinker.   REVIEW OF SYSTEMS:  A 14-point review of systems was reviewed.  Positive for  fatigue and headaches.  Vaginal itching with heavy vaginal bleeding.   PHYSICAL EXAMINATION:  VITAL SIGNS:  Blood pressure is 148/98, weight is  203.  GENERAL:  She is an obese black female in no acute distress.  LUNGS:  Clear bilaterally.  CV:  Regular rate and rhythm.  No rubs, gallops or murmurs.  ABDOMEN:  Soft, nontender, nondistended.  Question of a mass in the lower  abdomen.  GU:  Normal external female genitalia.  BUS is normal.  Vagina is pink and  rugated.  There is a nabothian cyst on the posterior portion of the cervix  with no mass.  The uterus is anteverted, 10 weeks size.  No significant  tenderness.   PERTINENT LABORATORIES:  Two small fibroids are seen which are submucosal in  nature in the endometrial canal.  Normal ultrasound.  Endometrial biopsy  shows benign endometrial fragments.  CBC shows a hemoglobin of 12.9.  BMP  shows elevated glucose of 170.  TSH is normal at 2.329.  Normal creatinine  at 0.7.   IMPRESSION:  1.  Fibroid uterus with __________ endometrial canal.  2.  Menorrhagia with heavy vaginal bleeding.  She desires permanent  treatment.   PLAN:  We have scheduled her for a laparoscopically-assisted vaginal  hysterectomy, given that she has had a previous myomectomy and I am worried  about adhesions.           ______________________________  Tinnie Gens, MD     TP/MEDQ  D:  09/03/2005  T:  09/03/2005  Job:  [id number]

## 2010-08-17 NOTE — H&P (Signed)
[facility]  Patient:    [name], [name] Visit Number: [id number] MRN: [id number]          Service Type: GYN Location: 9300 [id number] 01 Attending Physician:  Esmeralda Arthur Dictated by:   Silverio Lay, M.D. Admit Date:  09/04/2001 Discharge Date: 09/06/2001                           History and Physical  DATE OF BIRTH:                2060-04-11  REASON FOR ADMISSION:         Symptomatic uterine fibroids.  HISTORY OF PRESENT ILLNESS:   This is a 50 year old African-American female, gravida 0, who has been complaining of increasingly heavier periods for the last four months with increased dysmenorrhea.  She reports change a pad and tampon every 15-30 minutes for three days and dysmenorrhea as severe as 10/10 not relieved with Anaprox.  Her menses are regular and last for a total of five days.  She denies any metrorrhagia, denies any dyspareunia, and denies any postcoital bleeding.  She had a sonohysterogram in the office on July 22, 2001, revealing a uterine size of 9.2 x 4.8 x 5.6 cm with at least four measurable fibroids, the largest one located posterior mid uterus measuring 4 cm, one anterior anterior fundal 1.6 cm, and two submucosal measuring 5.9 mm and 1.8 cm.  The endometrial lining was 4.6 mm and both ovaries were seen and normal.  When she was first evaluated on June 29, 2001, she had a decreased hemoglobin of 8.2 and she was started on iron supplement, as well as continuous Nordette.  It is to be noted that periods have been increasing despite the use of Nordette in a cyclic fashion.  She has not been bleeding for the last month.  Her last hemoglobin in the office on Aug 20, 2001, was increased at 10.4.  The patient is requesting myomectomy, wanting to preserve her uterus and wanting to avoid long-term medical treatment with Depo-Provera or Lupron Depot.  REVIEW OF SYSTEMS:            Constitutional:  Negative.   Cardiovascular: Negative.  Genitourinary:  Negative.  Gastrointestinal:  Negative. Psychiatric:  Negative.  SOCIAL HISTORY:               The patient is a nonsmoker and has a nonstressful work environment.  PAST MEDICAL HISTORY:         1. Uterine fibroids.                               2. B12 deficiency.                               3. Status post LEEP procedure in July of 2000.                               4. Normal Pap smear on June 29, 2001.                               5. Status post repair of left eardrum.  6. Status post removal of bunions on both feet.  FAMILY HISTORY:               Unremarkable.  PHYSICAL EXAMINATION:         Her current weight is 194 pounds.  VITAL SIGNS:                  Blood pressure 120/80.  HEENT:                        Negative.  LUNGS:                        Clear.  NECK:                         Thyroid normal.  HEART:                        Normal.  BACK:                         Normal.  No CVA tenderness.  ABDOMEN:                      Normal.  No masses, no tenderness, and no organomegaly.  EXTREMITIES:                  Negative.  NEUROLOGIC:                   Normal.  GYNECOLOGICAL:                Normal vulva and vagina.  Normal cervix.  The uterus is increased in size, irregular, mobile, and compatible with 10-12 weeks size.  Both adnexa are normal.  ASSESSMENT:                   Symptomatic uterine fibroids despite birth control therapy in a patient with improving anemia and requesting myomectomy.  PLAN:                         The patient will undergo myomectomy on September 04, 2001.  The procedure has been reviewed as the patient, as well as the possible complications, including bleeding, infection, postoperative tubal occlusion, risks of hysterectomy, worsening anemia, and transfusion.  Informed consent was obtained. Dictated by:   Silverio Lay, M.D. Attending Physician:  Esmeralda Arthur DD:   09/03/01 TD:  09/03/01 Job: [id number]

## 2010-08-17 NOTE — Group Therapy Note (Signed)
NAME:  [name], [name] NO.:  [id number]   MEDICAL RECORD NO.:  [id number]           PATIENT TYPE:   LOCATION:  [facility]                     FACILITY:   PHYSICIAN:  Tinnie Gens, MD             DATE OF BIRTH:   DATE OF SERVICE:                                    CLINIC NOTE   This patient was seen at [facility].   CHIEF COMPLAINT:  Heavy periods.   HISTORY OF PRESENT ILLNESS:  The patient is a 50 year old nulligravida  patient who has a long history of fibroids and menorrhagia.  She had a  myomectomy in 2003.  The patient did not want a hysterectomy at the time.  She has been on pills ever since.  She reports that she does not have  intermenstrual bleeding but with her periods, the bleeding is very heavy.  She is going through tampons plus pads every 30-45 minutes.  She is passing  a lot of clots.  A lot of pain developed with her cycles.  Patient also  reports headaches with her cycles.  The patient still would like to avoid a  hysterectomy if possible.   Her past medical history is positive for borderline high blood pressure and  borderline diabetes.  She has had a cold recently.  Has been taking Tylenol  Cold & Flu.   Her surgical history is myomectomy and bunionectomy.   ALLERGIES:  None known.   MEDICATIONS:  Loestrin birth control pill daily.   OB HISTORY:  She is G0.   GYNECOLOGIC HISTORY:  Cycles are regular with the pill.  She has a history  of a LEEP.  Her last Pap was in June, 2006 and normal.  Last mammogram July,  2006.   FAMILY HISTORY:  Significant for diabetes and hypertension.   The patient works at the [employer].  She does not smoke.  She is a  social drinker.  No other drug use.   Fourteen-point review of systems reviewed.  Positive for fatigue, headaches  related to her cycles, as stated in the HPI.  Some problems with urination  and vaginal itching.  Please see GYN history in the chart.   PHYSICAL EXAMINATION:  VITAL  SIGNS:  On exam today, her blood pressure is  148/98.  Weight is 203.  GENERAL:  She is an obese black female in no acute distress.  LUNGS:  Clear bilaterally.  CARDIOVASCULAR:  Regular rate and rhythm.  No murmurs, rubs or gallops.  ABDOMEN:  Soft, nontender, nondistended.  There is a question of a mass in  the lower abdomen.  GU:  Normal external female genitalia.  BUS is normal.  Vagina is pink and  rugated.  Cervix is visualized.  There is a nabothian cyst on the posterior  portion of the cervix but no mass.  The uterus is anteverted and maybe 10  weeks size but no real masses are noted.  The uterus is not exceptionally  firm either.   PROCEDURE:  The cervix was cleaned with Betadine x3.  The cervix was grasped  anteriorly with a single-tooth tenaculum.  It sounded to 7 cm, and an  endometrial biopsy is obtained without difficulty.   IMPRESSION:  Abnormal uterine bleeding, history of fibroids.   PLAN:  1.  Endometrial biopsy today.  2.  Check TSH and CBC today.  3.  Pelvic ultrasound.  4.  I advised the patient to skip the placebo portion of her pills.  She is      to do that with refills on her regular birth control pill.  I have      provided samples in case her insurance will not refill these after three      weeks or she can refill a new prescription __________, which basically      would do this for her.  5.  Once we have all the information back, we will see her back in      approximately 6-8 weeks to determine the best course of treatment, if      any is warranted.           ______________________________  Tinnie Gens, MD     TP/MEDQ  D:  07/02/2005  T:  07/03/2005  Job:  [id number]

## 2010-08-17 NOTE — Op Note (Signed)
[facility]  Patient:    [name], [name] Visit Number: [id number] MRN: [id number]          Service Type: GYN Location: [id number] Attending Physician:  Esmeralda Arthur Dictated by:   Silverio Lay, M.D. Admit Date:  09/04/2001 Discharge Date: 09/06/2001                             Operative Report  PREOPERATIVE DIAGNOSES:       Symptomatic uterine fibroids.  POSTOPERATIVE DIAGNOSES:      Symptomatic uterine fibroids, pelvic adhesion, bilateral tubal occlusion.  ANESTHESIA:                   General with endotracheal intubation.  PROCEDURE:                    Multiple myomectomy, lysis of adhesion, chromopertubation.  SURGEON:                      Silverio Lay, M.D.  ASSISTANT:                    Henreitta Leber, P.A.  ESTIMATED BLOOD LOSS:         450 cc.  PROCEDURE:                    After being informed of the planned procedure with possible complications including bleeding, infection, postoperative fever, regrowth of other fibroids, injury to other abdominal pelvic organs such as bowels, bladders, or ureters, postoperative tubal occlusion, postoperative dysfunctional uterine bleeding, informed consent was obtained. Patient was taken to OR #2, given general anesthesia with endotracheal intubation, and placed in a dorsal decubitus position with antiembolus stockings.  Her knees were flexed.  She was prepped and draped in a sterile fashion including vaginal prepping.  A Foley catheter was inserted in her bladder and an 8 French catheter was inserted in her uterus which was inflated with 3 cc of saline.  We infiltrated the area of incision with 10 cc of Marcaine 0.25 and proceeded with a Pfannenstiel incision to the fascia.  The fascia was incised in a low transverse fashion and linea alba was dissected. Peritoneum was entered in a midline fashion.  Abdominopelvic exploration revealed normal peritoneal surfaces, normal liver,  normal gallbladder, normal periaortic nodes.  Appendix was not visualized.  Possibility of a small umbilical hernia.  The uterus was boggy, about 12 weeks size with multiple fibroids, one fundal, one fundal left cornual measuring approximately 3 cm, one posterior lower uterine body measuring 4 cm, three or four subserosal fibroids 5 mm or less, one right mid body submucosal fibroid measuring 3 cm. The self retaining retractor is placed and the bowels are retracted with wet laps.  We proceed with infiltrating the fundal anterior and posterior uterine serosa with vasopressin 20 and 100 and started with a fundal incision using needle cautery.  This allowed [place] the removal of the fundal and left cornual fundal fibroids which entered the endometrial cavity on a 1.5 cm distance at the fundus.  We then proceeded with removal of the right mid body anterior fibroid which did not open the endometrial cavity.  Using methylene blue through the previously placed intrauterine catheter, we are able to fully visualize the endometrial cavity which allows [place] to identify the margins of the endometrial lining and proceed with closure of the lining avoiding  intrauterine sutures using a 3-0 Vicryl.  We then close a deeper plane of myometrium using figure-of-eight stitches of 3-0 Vicryl and the serosa is closed using a baseball stitch of 2-0 Vicryl.  Small subserosal fibroids are removed with cautery.  We proceed with a posterior infiltration of vasopressin and open the posterior wall of the uterus on a 3 cm distance using the needle cautery and remove the large posterior 4 cm fibroid and another 2 cm fibroid over it.  The endometrial cavity is not entered during this process.  The deep myometrium stitches are placed using 3-0 Vicryl and a baseball stitch of 2-0 Vicryl completes the closure of the uterus.  We take time to evaluate hemostasis and place two figure-of-eight stitches on the anterior suture line using  2-0 Vicryl.  Hemostasis is then felt to be adequate.  We irrigate the pelvis greatly and then pay attention to tubes and ovaries which are all involved in an adhesion process, mainly filmy and vascular adhesions which are all removed using the needle point cautery.  The right tube has a fimbrial end.  The right ovary is normal.  Both are now freed.  The left tube is occluded.  Fimbrial end is not identified and the left ovary is now normal. Chromopertubation using methylene blue is negative on both sides.  We irrigate with warm saline, note a satisfactory hemostasis, remove the wet laps, place two sheets of Interseed on the suture lines, and remove the retractors.  Under fascia hemostasis is completed with cautery and fascia is closed with two running sutures of 0 Vicryl meeting midline.  Fat is irrigated with warm saline.  Hemostasis is completed with cautery and skin is closed with subcuticular suture of Monocryl 3-0 and Steri-Strips.  Instruments and sponge count is complete x3.  Estimated blood loss is 450 cc.  The procedure is very well tolerated by the patient who is taken to recovery room in a well and stable condition.  The intrauterine catheter will remain for 24 hours and the patient will be put on high dose estrogen in order to reduce the risk of intrauterine synechia.  If the patient were to become pregnant, a primary low transverse cesarean section would be recommended due to the large incision on the uterus as well as the large entry in the endometrial cavity. Dictated by:   Silverio Lay, M.D. Attending Physician:  Esmeralda Arthur DD:  09/04/01 TD:  09/07/01 Job: [id number]

## 2010-08-17 NOTE — Group Therapy Note (Signed)
[name], [name] NO.:  [id number]   MEDICAL RECORD NO.:  [id number]          PATIENT TYPE:  WOC   LOCATION:  [facility]                   FACILITY:  [facility]   PHYSICIAN:  Tinnie Gens, MD        DATE OF BIRTH:  11-May-1960   DATE OF SERVICE:  08/13/2005                                    CLINIC NOTE   CHIEF COMPLAINT:  Follow-up visit.   HISTORY OF PRESENT ILLNESS:  The patient is a 51 year old nulligravida who  has previously been seen here for heavy cycles.  She has had a myomectomy in  2003 for fibroid uterus.  Workup of her heavy bleeding revealed normal  endometrial biopsy.  Pelvic ultrasound which shows 8 x 4 x 6 cm uterus with  a 5 mm endometrial stripe and she has two submucosal fibroids, the largest  of which is about 1.3 x 1.0 x 1.6 cm.  Normal ovaries.   On exam today, her blood pressure is still elevated at 142/92.  Weight is  204.  Her abdomen is soft and nontender.   At her last visit, we put her on continuous OCs which has worked well  because she has not had a cycle and certainly has had no heavy bleeding and  the patient complained today of urinary frequency as she was complaining  last visit as well.   IMPRESSION:  1.  Submucosal fibroids.  2.  Menorrhagia probably related to #1.  3.  History of myomectomy.  4.  Borderline blood pressure.  5.  Borderline diabetes.  6.  Urinary frequency.   PLAN:  A lengthy discussion was had with the patient about options for  treatment of her heavy periods including either artery embolization of the  fibroids, endometrial ablation and hysterectomy.  The limitations of each of  the procedures was also explained to the patient as well as the risks of  major surgery, especially in a patient who has had a previous myomectomy.  Risks of bowel, bladder and ureteral injury were discussed at length.  After  taking a long time to sort of think about it, the patient has decided to go  with hysterectomy.  Have  planned a laparoscopic-assisted vaginal  hysterectomy to at least look and make sure there is no significant  adhesions of bowel to the uterus from a prior myomectomy.           ______________________________  Tinnie Gens, MD     TP/MEDQ  D:  08/13/2005  T:  08/13/2005  Job:  [id number]

## 2010-09-18 ENCOUNTER — Observation Stay: Payer: Self-pay | Admitting: Internal Medicine

## 2010-09-19 DIAGNOSIS — R079 Chest pain, unspecified: Secondary | ICD-10-CM

## 2010-10-09 ENCOUNTER — Encounter: Payer: Self-pay | Admitting: Cardiovascular Disease

## 2010-10-11 ENCOUNTER — Encounter: Payer: 59 | Admitting: Cardiovascular Disease

## 2011-08-21 ENCOUNTER — Ambulatory Visit (INDEPENDENT_AMBULATORY_CARE_PROVIDER_SITE_OTHER): Payer: 59 | Admitting: Obstetrics & Gynecology

## 2011-08-21 ENCOUNTER — Encounter: Payer: Self-pay | Admitting: Obstetrics & Gynecology

## 2011-08-21 VITALS — BP 122/81 | HR 80 | Ht 63.0 in | Wt 198.0 lb

## 2011-08-21 DIAGNOSIS — N760 Acute vaginitis: Secondary | ICD-10-CM

## 2011-08-21 DIAGNOSIS — Z1239 Encounter for other screening for malignant neoplasm of breast: Secondary | ICD-10-CM

## 2011-08-21 DIAGNOSIS — R1031 Right lower quadrant pain: Secondary | ICD-10-CM

## 2011-08-21 DIAGNOSIS — Z1231 Encounter for screening mammogram for malignant neoplasm of breast: Secondary | ICD-10-CM

## 2011-08-21 DIAGNOSIS — B373 Candidiasis of vulva and vagina: Secondary | ICD-10-CM

## 2011-08-21 NOTE — Progress Notes (Addendum)
History:  51 y.o. G0 s/p LAVH in 2007 for fibroids and menorrhagia here today for evaluation of RLQ pain  X 2 months and vulvovaginitis x 2 weeks.  RLQ pain is intermittent, sharp, not alleviated or worsened by any factors, not associated with GI/GU habits. Points to area above McBurney's point and point of maximal tenderness.  For her vulvovaginitis, external pruritus noted, no discharge noted, no associated symptoms.  The following portions of the patient's history were reviewed and updated as appropriate: allergies, current medications, past family history, past medical history, past social history, past surgical history and problem list.  Review of Systems:  Pertinent items are noted in HPI.  Objective:  Physical Exam Blood pressure 122/81, pulse 80, height 5\' 3"  (1.6 m), weight 198 lb (89.812 kg). Gen: NAD Abd: Soft, obese, nondistended, RLQ pain above McBurney's point upon palpation of area, no rebound or guarding Pelvic: Normal appearing external genitalia; normal appearing vaginal mucosa and cuff. Normal appearing discharge, wet prep and GC/Chlam samples obtained. No other palpable masses, no adnexal tenderness on bimanual exam.   Assessment & Plan:  1) RUQ pain: Likely not of GYN etiology, concerned about possible GI etiology. Will obtain pelvic CT with contrast.  Follow up results and manage accordingly. 2) Vulvovaginitis: No etiology seen on exam.  Follow up wet prep, GC/Chlam results and manage accordingly. Recommended 1% hydrocortisone cream externally to help with pruritus. 3) Preventative health maintenance: No need for pap smears and she is s/p hysterectomy for benign reasons.  Mammogram ordered.  Will follow up with PCP for other parts of PHM.  Patient told to call/come back for worsening symptoms. Follow up in 2 weeks to discuss results.

## 2011-08-21 NOTE — Patient Instructions (Signed)
Return to clinic for any scheduled appointments or for any gynecologic concerns as needed.   

## 2011-08-22 LAB — WET PREP, GENITAL: Trich, Wet Prep: NONE SEEN

## 2011-08-22 LAB — GC/CHLAMYDIA PROBE AMP, GENITAL: Chlamydia, DNA Probe: NEGATIVE

## 2011-08-22 MED ORDER — FLUCONAZOLE 150 MG PO TABS: 150.0000 mg | ORAL_TABLET | Freq: Once | ORAL | Status: AC

## 2011-08-22 NOTE — Progress Notes (Signed)
Wet prep showed yeast. Diflucan e-prescribed.  Patient called and informed to pick up prescription.

## 2011-08-22 NOTE — Progress Notes (Signed)
Addended by: Jaynie Collins A on: 08/22/2011 08:38 AM   Modules accepted: Orders

## 2011-08-23 ENCOUNTER — Ambulatory Visit (HOSPITAL_COMMUNITY)
Admission: RE | Admit: 2011-08-23 | Discharge: 2011-08-23 | Disposition: A | Discharge status: Home / Self Care | Payer: 59 | Source: Ambulatory Visit | Attending: Obstetrics & Gynecology | Admitting: Obstetrics & Gynecology

## 2011-08-23 DIAGNOSIS — K429 Umbilical hernia without obstruction or gangrene: Secondary | ICD-10-CM | POA: Insufficient documentation

## 2011-08-23 DIAGNOSIS — R1031 Right lower quadrant pain: Secondary | ICD-10-CM | POA: Insufficient documentation

## 2011-08-23 DIAGNOSIS — Z90710 Acquired absence of both cervix and uterus: Secondary | ICD-10-CM | POA: Insufficient documentation

## 2011-08-23 DIAGNOSIS — N949 Unspecified condition associated with female genital organs and menstrual cycle: Secondary | ICD-10-CM | POA: Insufficient documentation

## 2011-08-23 MED ORDER — IOHEXOL 300 MG/ML  SOLN
100.0000 mL | Freq: Once | INTRAMUSCULAR | Status: AC | PRN
  Administered 2011-08-23: 100 mL via INTRAVENOUS

## 2011-09-30 ENCOUNTER — Ambulatory Visit (HOSPITAL_COMMUNITY)
Admission: RE | Admit: 2011-09-30 | Discharge: 2011-09-30 | Disposition: A | Discharge status: Home / Self Care | Payer: 59 | Source: Ambulatory Visit | Attending: Obstetrics & Gynecology | Admitting: Obstetrics & Gynecology

## 2011-09-30 DIAGNOSIS — Z1231 Encounter for screening mammogram for malignant neoplasm of breast: Secondary | ICD-10-CM | POA: Insufficient documentation

## 2011-12-04 ENCOUNTER — Encounter: Payer: Self-pay | Admitting: Family Medicine

## 2011-12-04 ENCOUNTER — Ambulatory Visit (INDEPENDENT_AMBULATORY_CARE_PROVIDER_SITE_OTHER): Payer: 59 | Admitting: Family Medicine

## 2011-12-04 VITALS — BP 118/82 | HR 72 | Temp 98.4°F | Wt 201.8 lb

## 2011-12-04 DIAGNOSIS — K625 Hemorrhage of anus and rectum: Secondary | ICD-10-CM

## 2011-12-04 DIAGNOSIS — M545 Low back pain, unspecified: Secondary | ICD-10-CM

## 2011-12-04 LAB — CBC WITH DIFFERENTIAL/PLATELET
Basophils Absolute: 0 10*3/uL (ref 0.0–0.1)
Basophils Relative: 0.6 % (ref 0.0–3.0)
Eosinophils Absolute: 0 10*3/uL (ref 0.0–0.7)
Hemoglobin: 12.2 g/dL (ref 12.0–15.0)
Lymphocytes Relative: 40.8 % (ref 12.0–46.0)
MCHC: 32 g/dL (ref 30.0–36.0)
MCV: 80.8 fl (ref 78.0–100.0)
Monocytes Absolute: 0.5 10*3/uL (ref 0.1–1.0)
Neutro Abs: 1.9 10*3/uL (ref 1.4–7.7)
Neutrophils Relative %: 46.2 % (ref 43.0–77.0)
RDW: 16.5 % — ABNORMAL HIGH (ref 11.5–14.6)

## 2011-12-04 MED ORDER — CETIRIZINE HCL 10 MG PO TABS: 10.0000 mg | ORAL_TABLET | Freq: Every day | ORAL | Status: DC

## 2011-12-04 MED ORDER — FLUTICASONE PROPIONATE 50 MCG/ACT NA SUSP: 2.0000 | Freq: Every day | NASAL | Status: DC

## 2011-12-04 MED ORDER — NAPROXEN 500 MG PO TABS: ORAL_TABLET | ORAL | Status: DC

## 2011-12-04 MED ORDER — POLYETHYLENE GLYCOL 3350 17 GM/SCOOP PO POWD: 17.0000 g | Freq: Every day | ORAL | Status: AC | PRN

## 2011-12-04 MED ORDER — CYCLOBENZAPRINE HCL 10 MG PO TABS: 10.0000 mg | ORAL_TABLET | Freq: Two times a day (BID) | ORAL | Status: AC | PRN

## 2011-12-04 NOTE — Progress Notes (Signed)
  Subjective:    Patient ID: [name], female    DOB: 01/12/1961, 51 y.o.   MRN: [id number]  HPI CC: rectal bleeding, back pain  request refill of flonase - done.  H/o DM and "labile bp" per chart but pt no significant problem. Lab Results  Component Value Date   HGBA1C 6.2 10/05/2008   Noted rectal bleeding for last 5-6 days.  Blood in commode.  Also noted blood when wiping.  Worse bleeding yesterday.  No pain with defecation.  No blood in urine.  No pain at bottom.  No h/o hemorrhoids.  Also noted sharp pains in lower back for last 2 days.  Worse pain with certain movements.  Denies shooting pain down legs, numbness or weakness of legs, fevers/chills, bowel/bladder incontinence.  H/o constipation in past.  Currently continued hard stools.  2 BMs per day.  Does drink good amt of water as well as high fiber intake.    Goody powder use - takes 1-2 powders daily for back and neck pain.  Tylenol doesn't work as well.  H/o colonoscopy 03/2007 in chart, normal colon.  Also had EGD - normal.  Done by [facility] Dr. Henrene Hawking.  Done for blood in stool at that time.  No polyps.  Past Medical History  Diagnosis Date  . Diabetes mellitus   . Labile blood pressure   . High frequency hearing loss   . Fibroids   . Pelvic pain   . Anemia   . Hx of migraine headaches   . Arthritis   . Vaginal itching   . History of abnormal Pap smear      Review of Systems Per HPI    Objective:   Physical Exam  Nursing note and vitals reviewed. Constitutional: She appears well-developed and well-nourished. No distress.       uncomfortable getting on exam table, with position changes  Abdominal: Soft. Bowel sounds are normal. She exhibits no distension and no mass. There is no hepatosplenomegaly. There is no tenderness. There is no rebound, no guarding, no CVA tenderness and negative Murphy's sign.  Genitourinary: Rectal exam shows no external hemorrhoid, no fissure, no mass, no tenderness  and anal tone normal.       Obvious blood on glove with rectal exam. No pain or swelling appreciated with palpation internal vault. No stool in vault.  Musculoskeletal:       Mild midline upper lumbar pain.  ++ R paraspinous lumbar muscles spasm/tightness.  +++ R SIJ discomfort to palpation. Neg SLR bilaterally.  no pain at sciatic notches or GTB bilaterally. ++ FABER on right. 5/5 strength bilateral lower extremities  Skin: Skin is warm and dry. No rash noted.  Psychiatric: She has a normal mood and affect.       Assessment & Plan:

## 2011-12-04 NOTE — Assessment & Plan Note (Addendum)
Exam consistent with R sided sacroiliitis. Treat with naprosyn twice daily for 5-6 days then prn, as well as flexeril for muscle spasm/tightness present. Provided with stretching exercises from SM pt advisor on sacroiliitis. No red flags.

## 2011-12-04 NOTE — Patient Instructions (Addendum)
Flonase refilled today. For back - I think you have sacroiliitis - treat with stretching exercises provided as well as muscle relaxant and naprosyn anti inflammatory for pain (don't take with goody powder). For bleeding - there was some bleeding noted on the inside - I wonder if a burst blood vessel or capillary.  We will check blood count today to ensure no anemia present. We will work on softening stools with daily docusate (colace) over the counter and miralax once daily (hold for diarrhea).  Could also start soluble fiber supplement like metamucil or benefiber. If continued bleeding despite this, let us know for referral to gastroenterologist again. Good to meet you today, call us with questions.

## 2011-12-04 NOTE — Assessment & Plan Note (Signed)
Reassuring recent colonoscopy. Anticipate int hemorrhoid or burst blood vessel. Will work on softening stool to allow time to heal. If not improving, will refer to GI for further eval. Pt agrees with plan.  Check CBC today.

## 2011-12-05 ENCOUNTER — Telehealth: Payer: Self-pay

## 2011-12-05 NOTE — Telephone Encounter (Signed)
Patient notified as instructed by telephone per result note in pts chart 12/04/11.

## 2012-03-03 ENCOUNTER — Ambulatory Visit (INDEPENDENT_AMBULATORY_CARE_PROVIDER_SITE_OTHER): Payer: 59 | Admitting: Family Medicine

## 2012-03-03 ENCOUNTER — Encounter: Payer: Self-pay | Admitting: Family Medicine

## 2012-03-03 VITALS — BP 132/88 | HR 79 | Temp 98.7°F | Ht 63.0 in | Wt 204.2 lb

## 2012-03-03 DIAGNOSIS — J329 Chronic sinusitis, unspecified: Secondary | ICD-10-CM

## 2012-03-03 DIAGNOSIS — A499 Bacterial infection, unspecified: Secondary | ICD-10-CM

## 2012-03-03 DIAGNOSIS — B9689 Other specified bacterial agents as the cause of diseases classified elsewhere: Secondary | ICD-10-CM

## 2012-03-03 MED ORDER — MOMETASONE FUROATE 50 MCG/ACT NA SUSP: 2.0000 | Freq: Every day | NASAL | Status: DC

## 2012-03-03 MED ORDER — LEVOFLOXACIN 500 MG PO TABS: 500.0000 mg | ORAL_TABLET | Freq: Every day | ORAL | Status: DC

## 2012-03-03 NOTE — Progress Notes (Signed)
Subjective:    Patient ID: [name], female    DOB: 13-Jun-1960, 51 y.o.   MRN: [id number]  HPI Here with uri symptoms- worried about a sinus infection Started with post nasal drip/ st - cold symptoms  Now a lot of sinus pain - mostly above her eyes Worst on the left side   Some cough from drainage  Not productive Stomach is queasy   Has taken otc "sinus tablet" Also multi cold symptom med -"cold relief"  flonase gives her headache- wants to change it also   Patient Active Problem List  Diagnosis  . DIABETES MELLITUS, MILD  . ANEMIA, IRON DEFICIENCY  . ANEMIA, B12 DEFICIENCY  . THROMBOCYTOPENIA  . ALLERGIC RHINITIS  . ARM NUMBNESS  . Headache  . FREQUENCY, URINARY  . Diseases of lips  . ELEVATED BLOOD PRESSURE  . Rectal bleeding  . Lower back pain   Past Medical History  Diagnosis Date  . Diabetes mellitus   . Labile blood pressure   . High frequency hearing loss   . Fibroids   . Pelvic pain   . Anemia   . Hx of migraine headaches   . Arthritis   . Vaginal itching   . History of abnormal Pap smear    Past Surgical History  Procedure Date  . Laparoscopic assisted vaginal hysterectomy 08/2005    Fibroids, menorrhagia.  Benign pathology  . Bunionectomy   . Colonoscopy 03/2007    neg  . Nasal endoscopy 03/2007    neg  . Flexible sigmoidoscopy 2000  . Leep    History  Substance Use Topics  . Smoking status: Never Smoker   . Smokeless tobacco: Never Used  . Alcohol Use: Yes     Comment: occasional   Family History  Problem Relation Age of Onset  . Heart disease Sister   . Arthritis Sister   . Diabetes Maternal Grandmother   . Hypertension Maternal Grandmother    Allergies  Allergen Reactions  . Amoxicillin-Pot Clavulanate     REACTION: Itchy rash   Current Outpatient Prescriptions on File Prior to Visit  Medication Sig Dispense Refill  . cetirizine (ZYRTEC) 10 MG tablet Take 1 tablet (10 mg total) by mouth daily.  30 tablet  3  .  fluticasone (FLONASE) 50 MCG/ACT nasal spray Place 2 sprays into the nose daily.  16 g  3  . naproxen (NAPROSYN) 500 MG tablet Take one po bid x 1 week then prn pain, take with food  60 tablet  0      Review of Systems    Review of Systems  Constitutional: Negative for fever, appetite change,  and unexpected weight change.  ENt pos for congestion/ sinus pain and purulent nasal drainage Eyes: Negative for pain and visual disturbance.  Respiratory: Negative for wheeze and shortness of breath.   Cardiovascular: Negative for cp or palpitations    Gastrointestinal: Negative for nausea, diarrhea and constipation.  Genitourinary: Negative for urgency and frequency.  Skin: Negative for pallor or rash   Neurological: Negative for weakness, light-headedness, numbness and headaches.  Hematological: Negative for adenopathy. Does not bruise/bleed easily.  Psychiatric/Behavioral: Negative for dysphoric mood. The patient is not nervous/anxious.      Objective:   Physical Exam  Constitutional: She appears well-developed and well-nourished. No distress.  HENT:  Head: Normocephalic and atraumatic.  Right Ear: External ear normal.  Left Ear: External ear normal.  Mouth/Throat: Oropharynx is clear and moist. No oropharyngeal exudate.  Nares are injected and congested   Frontal and ethmoid sinus tenderness worse on the L   Eyes: Conjunctivae normal and EOM are normal. Pupils are equal, round, and reactive to light. Right eye exhibits no discharge. Left eye exhibits no discharge.  Neck: Normal range of motion. Neck supple.  Cardiovascular: Normal rate and regular rhythm.   Pulmonary/Chest: Effort normal and breath sounds normal. No respiratory distress. She has no wheezes. She has no rales.  Lymphadenopathy:    She has no cervical adenopathy.  Neurological: She is alert. No cranial nerve deficit.  Skin: Skin is warm and dry. No rash noted. No erythema. No pallor.  Psychiatric: She has a normal  mood and affect.          Assessment & Plan:

## 2012-03-03 NOTE — Assessment & Plan Note (Signed)
tx with levaquin (all to augmentin) Disc symptomatic care - see instructions on AVS  Update if not starting to improve in a week or if worsening

## 2012-03-03 NOTE — Patient Instructions (Addendum)
For sinus infection take levaquin as directed Drink lots of fluids Try mucinex for congestion/ also nasonex and nasal saline spray  Warm compresses on face are helpful as is breathing steam  Aleve with food may help facial pain Update if not starting to improve in a week or if worsening

## 2012-04-15 ENCOUNTER — Ambulatory Visit: Payer: Self-pay | Admitting: Gastroenterology

## 2012-04-20 ENCOUNTER — Emergency Department: Payer: Self-pay | Admitting: Emergency Medicine

## 2012-05-29 ENCOUNTER — Ambulatory Visit: Payer: Self-pay | Admitting: Gastroenterology

## 2012-06-18 ENCOUNTER — Encounter: Payer: Self-pay | Admitting: Family Medicine

## 2012-08-20 ENCOUNTER — Encounter: Payer: Self-pay | Admitting: Family Medicine

## 2012-08-20 ENCOUNTER — Ambulatory Visit (INDEPENDENT_AMBULATORY_CARE_PROVIDER_SITE_OTHER): Payer: 59 | Admitting: Family Medicine

## 2012-08-20 VITALS — BP 126/80 | HR 81 | Temp 98.2°F | Wt 200.5 lb

## 2012-08-20 DIAGNOSIS — R1013 Epigastric pain: Secondary | ICD-10-CM | POA: Insufficient documentation

## 2012-08-20 DIAGNOSIS — B9689 Other specified bacterial agents as the cause of diseases classified elsewhere: Secondary | ICD-10-CM

## 2012-08-20 DIAGNOSIS — A499 Bacterial infection, unspecified: Secondary | ICD-10-CM

## 2012-08-20 DIAGNOSIS — E119 Type 2 diabetes mellitus without complications: Secondary | ICD-10-CM

## 2012-08-20 DIAGNOSIS — J329 Chronic sinusitis, unspecified: Secondary | ICD-10-CM

## 2012-08-20 DIAGNOSIS — IMO0001 Reserved for inherently not codable concepts without codable children: Secondary | ICD-10-CM

## 2012-08-20 DIAGNOSIS — R350 Frequency of micturition: Secondary | ICD-10-CM

## 2012-08-20 DIAGNOSIS — K3189 Other diseases of stomach and duodenum: Secondary | ICD-10-CM

## 2012-08-20 LAB — POCT URINALYSIS DIPSTICK
Blood, UA: NEGATIVE
Ketones, UA: NEGATIVE
Protein, UA: NEGATIVE
Spec Grav, UA: >=1.03
Urobilinogen, UA: NEGATIVE
pH, UA: 6

## 2012-08-20 LAB — POCT URINE PREGNANCY: Preg Test, Ur: NEGATIVE

## 2012-08-20 MED ORDER — AZITHROMYCIN 250 MG PO TABS: ORAL_TABLET | ORAL | Status: DC

## 2012-08-20 NOTE — Progress Notes (Signed)
She had EGD and this was reviewed.  She had some hip pain recently.  She used naproxen recently but then felt a little queasy, but the pain got better.  She isn't on PPI now.    Headaches.  Started over the last week.  No FCNAVD.  L ear pain, not on the R.  Mild ST, intermittent.  Facial pain, frontal.  Some cough.  Usually dry, occ with clearing throat.  She is on flonase and she didn't know if this could cause the recent HAs.  Not using nasal saline.    Dm2, but not on meds.   She has urinary frequency.  Overdue for DM2 f/u.   Meds, vitals, and allergies reviewed.   ROS: See HPI.  Otherwise, noncontributory.  GEN: nad, alert and oriented HEENT: mucous membranes moist, tm w/o erythema, nasal exam w/o erythema, clear discharge noted,  OP with cobblestoning, sinuses ttp x4 NECK: supple w/o LA CV: rrr.   PULM: ctab, no inc wob EXT: no edema SKIN: no acute rash

## 2012-08-20 NOTE — Assessment & Plan Note (Signed)
Presumed, given allergy hx start zmax and f/u prn.  Nontoxic, supportive care o/w.  Will ask PCP about PA on meds other than flonase.  Stop flonase for now.

## 2012-08-20 NOTE — Assessment & Plan Note (Signed)
Advised to restart PPI and limit nsaids.

## 2012-08-20 NOTE — Patient Instructions (Addendum)
Go to the lab on the way out.  We'll contact you with your lab report. Stop the flonase.  I'll talk to Dr. Milinda Antis about this.  Use nasal saline in the meantime.  Start the antibiotics today.  I would start back on prilosec, especially if you take the naproxen.  Take as little naproxen as possible.  Schedule a follow up with Dr. Milinda Antis about your diabetes.

## 2012-08-20 NOTE — Assessment & Plan Note (Signed)
Recheck A1c today, notify PCP.  This may be causing the urinary sx.  No burning with urination.

## 2012-08-25 ENCOUNTER — Telehealth: Payer: Self-pay | Admitting: Family Medicine

## 2012-08-25 NOTE — Telephone Encounter (Signed)
Message copied by Joaquim Nam on Tue Aug 25, 2012  9:58 PM ------      Message from: Roxy Manns A      Created: Sun Aug 23, 2012 12:02 PM       I would tell her that nasacort is avail otc now (I think that is the one)- have her try it to see how that works before doing PA      ----- Message -----         From: Joaquim Nam, MD         Sent: 08/20/2012   1:19 PM           To: Judy Pimple, MD            You think you can get a PA on a nasal steroid other than flonase?  See OV note.        ------

## 2012-08-25 NOTE — Telephone Encounter (Signed)
Please notify about the note from Dr. Milinda Antis below. Thanks.

## 2012-08-26 NOTE — Telephone Encounter (Signed)
Pt notified to try the nasacort and let us know if it doesn't work, pt verbalized understanding

## 2012-08-26 NOTE — Telephone Encounter (Signed)
Left message on voice mail  to call back

## 2012-08-28 ENCOUNTER — Telehealth: Payer: Self-pay

## 2012-08-28 MED ORDER — CYCLOBENZAPRINE HCL 5 MG PO TABS: 5.0000 mg | ORAL_TABLET | Freq: Two times a day (BID) | ORAL | Status: DC | PRN

## 2012-08-28 NOTE — Telephone Encounter (Signed)
Did she finish zpack?  I would try aleve OTC or prescription strength naprosyn (stronger) she has at home for what sounds like sinus pressure headache. If this doesn't help, may try flexeril 5mg  (sent a few in) - but that will make her sleepy. If not better, return to see PCP.

## 2012-08-28 NOTE — Telephone Encounter (Signed)
Pt seen 08/20/12 and h/a has not gone away; h/a is pressure feeling in top of head; pt feels dizzy with nausea and no energy. Pt has not vomited. Pain level now at 7-8. Pt request med for h/a to [facility].Please advise.if pt condition changes or worsens pt will call back. Dr Milinda Antis off computer today.Please advise.

## 2012-08-28 NOTE — Telephone Encounter (Signed)
Message left advising patient. Instructed to follow up with Dr. Milinda Antis if no better next week.

## 2012-10-02 ENCOUNTER — Other Ambulatory Visit: Payer: Self-pay | Admitting: Family Medicine

## 2012-10-04 NOTE — Telephone Encounter (Signed)
Refilled electronically 

## 2012-10-04 NOTE — Telephone Encounter (Signed)
To PCP

## 2012-10-13 ENCOUNTER — Ambulatory Visit (INDEPENDENT_AMBULATORY_CARE_PROVIDER_SITE_OTHER)
Admission: RE | Admit: 2012-10-13 | Discharge: 2012-10-13 | Disposition: A | Discharge status: Home / Self Care | Payer: 59 | Source: Ambulatory Visit | Attending: Family Medicine | Admitting: Family Medicine

## 2012-10-13 ENCOUNTER — Ambulatory Visit (INDEPENDENT_AMBULATORY_CARE_PROVIDER_SITE_OTHER): Payer: 59 | Admitting: Family Medicine

## 2012-10-13 ENCOUNTER — Telehealth: Payer: Self-pay

## 2012-10-13 ENCOUNTER — Encounter: Payer: Self-pay | Admitting: Family Medicine

## 2012-10-13 VITALS — BP 130/74 | HR 79 | Temp 98.6°F | Ht 63.0 in | Wt 205.0 lb

## 2012-10-13 DIAGNOSIS — M898X1 Other specified disorders of bone, shoulder: Secondary | ICD-10-CM | POA: Insufficient documentation

## 2012-10-13 DIAGNOSIS — E119 Type 2 diabetes mellitus without complications: Secondary | ICD-10-CM

## 2012-10-13 DIAGNOSIS — M79609 Pain in unspecified limb: Secondary | ICD-10-CM

## 2012-10-13 DIAGNOSIS — D509 Iron deficiency anemia, unspecified: Secondary | ICD-10-CM

## 2012-10-13 NOTE — Telephone Encounter (Signed)
I will see her then  

## 2012-10-13 NOTE — Assessment & Plan Note (Signed)
Lab and f/u planned for fall

## 2012-10-13 NOTE — Telephone Encounter (Signed)
Pt said 10/10/12 noticed knot on rt collar bone with tenderness to touch; today symptoms same and appears some swelling. No known injury,no CP or SOB. Pt scheduled appt today 3:30 with Dr Milinda Antis (pt could not leave work until late afternoon).

## 2012-10-13 NOTE — Patient Instructions (Addendum)
Use a cold compress on your painful area (clavicle) for 10 minutes at a time  Xray today  You will hear from [place] in the am  Follow up with me in sept with labs prior

## 2012-10-13 NOTE — Progress Notes (Signed)
Subjective:    Patient ID: [name], female    DOB: 07/17/1960, 52 y.o.   MRN: [id number]  HPI Here with a bump over her R collar bone -one towards center of chest and one lateral around shoulder  No trauma  No hx of scoliosis  A bit tender to the touch  Last night - had some pain in shoulder - that is better , just sensitive today   Is utd mammograms  Never smoked   Lab Results  Component Value Date   WBC 4.2* 12/04/2011   HGB 12.2 12/04/2011   HCT 38.1 12/04/2011   MCV 80.8 12/04/2011   PLT 139.0* 12/04/2011    Needs f/u for DM - sept  Also will need px for equiptment but ? What her insurance covers  Patient Active Problem List   Diagnosis Date Noted  . Pain of right clavicle 10/13/2012  . Dyspepsia 08/20/2012  . Sinusitis, bacterial 03/03/2012  . Rectal bleeding 12/04/2011  . Lower back pain 12/04/2011  . ELEVATED BLOOD PRESSURE 06/04/2010  . Diseases of lips 04/10/2010  . ANEMIA, B12 DEFICIENCY 10/05/2008  . THROMBOCYTOPENIA 10/05/2008  . ALLERGIC RHINITIS 10/05/2008  . ARM NUMBNESS 10/05/2008  . Headache 10/05/2008  . FREQUENCY, URINARY 10/05/2008  . DIABETES MELLITUS, MILD 01/26/2008  . ANEMIA, IRON DEFICIENCY 01/26/2008   Past Medical History  Diagnosis Date  . Diabetes mellitus   . Labile blood pressure   . High frequency hearing loss   . Fibroids   . Pelvic pain   . Anemia   . Hx of migraine headaches   . Arthritis   . Vaginal itching   . History of abnormal Pap smear    Past Surgical History  Procedure Laterality Date  . Laparoscopic assisted vaginal hysterectomy  08/2005    Fibroids, menorrhagia.  Benign pathology  . Bunionectomy    . Colonoscopy  03/2007    neg  . Nasal endoscopy  03/2007    neg  . Flexible sigmoidoscopy  2000  . Leep     History  Substance Use Topics  . Smoking status: Never Smoker   . Smokeless tobacco: Never Used  . Alcohol Use: Yes     Comment: occasional   Family History  Problem Relation Age of Onset  .  Heart disease Sister   . Arthritis Sister   . Diabetes Maternal Grandmother   . Hypertension Maternal Grandmother    Allergies  Allergen Reactions  . Amoxicillin-Pot Clavulanate     REACTION: Itchy rash   Current Outpatient Prescriptions on File Prior to Visit  Medication Sig Dispense Refill  . fluticasone (FLONASE) 50 MCG/ACT nasal spray USE 2 SPRAYS IN THE NOSE ONCE A DAY  48 g  3  . loratadine (CLARITIN) 10 MG tablet Take 10 mg by mouth daily.      . naproxen (NAPROSYN) 500 MG tablet Take one po bid x 1 week then prn pain, take with food  60 tablet  0  . cyclobenzaprine (FLEXERIL) 5 MG tablet Take 1 tablet (5 mg total) by mouth 2 (two) times daily as needed for muscle spasms.  10 tablet  0   No current facility-administered medications on file prior to visit.     Review of Systems Review of Systems  Constitutional: Negative for fever, appetite change, fatigue and unexpected weight change.  Eyes: Negative for pain and visual disturbance.  Respiratory: Negative for cough and shortness of breath.   Cardiovascular: Negative for cp or palpitations  Gastrointestinal: Negative for nausea, diarrhea and constipation.  Genitourinary: Negative for urgency and frequency.  Skin: Negative for pallor or rash  MSK pos for clavicle pain and prominence on R - shoulder soreness yesterday/ no other joint swelling or problems  Neurological: Negative for weakness, light-headedness, numbness and headaches.  Hematological: Negative for adenopathy. Does not bruise/bleed easily.  Psychiatric/Behavioral: Negative for dysphoric mood. The patient is not nervous/anxious.         Objective:   Physical Exam  Constitutional: She appears well-developed and well-nourished. No distress.  obese and well appearing   HENT:  Head: Normocephalic and atraumatic.  Eyes: Conjunctivae and EOM are normal. Pupils are equal, round, and reactive to light. Right eye exhibits no discharge. No scleral icterus.  Neck:  Normal range of motion. Neck supple. No JVD present. Carotid bruit is not present. No tracheal deviation present. No thyromegaly present.  Cardiovascular: Normal rate, regular rhythm and intact distal pulses.   Pulmonary/Chest: Effort normal and breath sounds normal. No respiratory distress. She has no rales. She exhibits tenderness.  Abdominal: Soft. Bowel sounds are normal.  Musculoskeletal: She exhibits tenderness. She exhibits no edema.  Tender over medial and lateral ends of R clavicle Medial end is more prominent than other clavicle and there is some redness of skin from pt rubbing it   Lymphadenopathy:    She has no cervical adenopathy.  Neurological: She is alert.  Skin: Skin is warm and dry. No rash noted. No pallor.  Psychiatric: She has a normal mood and affect.          Assessment & Plan:

## 2012-10-13 NOTE — Assessment & Plan Note (Signed)
With prominent medial end - mild bony tenderness at both ends Xray today

## 2012-10-13 NOTE — Assessment & Plan Note (Signed)
?   If this is from frequent blood donation Will re eval at f/u and check cbc

## 2012-10-15 ENCOUNTER — Telehealth: Payer: Self-pay | Admitting: Family Medicine

## 2012-10-15 DIAGNOSIS — M898X1 Other specified disorders of bone, shoulder: Secondary | ICD-10-CM

## 2012-10-15 NOTE — Telephone Encounter (Signed)
Message copied by Judy Pimple on Thu Oct 15, 2012 10:23 AM ------      Message from: Patience Musca      Created: Thu Oct 15, 2012  9:13 AM       Patient notified as instructed by telephone. Pt said still in a lot of pain and request referral to orthopedic.pt request cb from pt care coordinator at [phone] can leave detailed message. ------

## 2012-10-15 NOTE — Telephone Encounter (Signed)
Will do ref  

## 2012-10-24 ENCOUNTER — Other Ambulatory Visit: Payer: Self-pay | Admitting: Family Medicine

## 2012-10-25 NOTE — Telephone Encounter (Signed)
Please refill for 3 months  

## 2012-10-26 NOTE — Telephone Encounter (Signed)
done

## 2012-12-14 ENCOUNTER — Other Ambulatory Visit (INDEPENDENT_AMBULATORY_CARE_PROVIDER_SITE_OTHER): Payer: 59

## 2012-12-14 ENCOUNTER — Telehealth: Payer: Self-pay

## 2012-12-14 DIAGNOSIS — E119 Type 2 diabetes mellitus without complications: Secondary | ICD-10-CM

## 2012-12-14 DIAGNOSIS — D509 Iron deficiency anemia, unspecified: Secondary | ICD-10-CM

## 2012-12-14 LAB — COMPREHENSIVE METABOLIC PANEL
Alkaline Phosphatase: 56 U/L (ref 39–117)
Creatinine, Ser: 0.7 mg/dL (ref 0.4–1.2)
Glucose, Bld: 147 mg/dL — ABNORMAL HIGH (ref 70–99)
Sodium: 139 mEq/L (ref 135–145)
Total Bilirubin: 0.3 mg/dL (ref 0.3–1.2)
Total Protein: 6.7 g/dL (ref 6.0–8.3)

## 2012-12-14 LAB — CBC WITH DIFFERENTIAL/PLATELET
Eosinophils Relative: 1.7 % (ref 0.0–5.0)
HCT: 37.7 % (ref 36.0–46.0)
Lymphs Abs: 1.5 10*3/uL (ref 0.7–4.0)
MCV: 81.1 fl (ref 78.0–100.0)
Monocytes Absolute: 0.4 10*3/uL (ref 0.1–1.0)
Neutro Abs: 1.8 10*3/uL (ref 1.4–7.7)
Platelets: 131 10*3/uL — ABNORMAL LOW (ref 150.0–400.0)
RDW: 14.8 % — ABNORMAL HIGH (ref 11.5–14.6)
WBC: 3.7 10*3/uL — ABNORMAL LOW (ref 4.5–10.5)

## 2012-12-14 LAB — MICROALBUMIN / CREATININE URINE RATIO
Creatinine,U: 160 mg/dL
Microalb Creat Ratio: 0.5 mg/g (ref 0.0–30.0)

## 2012-12-14 LAB — LIPID PANEL
Cholesterol: 163 mg/dL (ref 0–200)
Total CHOL/HDL Ratio: 4
Triglycerides: 45 mg/dL (ref 0.0–149.0)

## 2012-12-14 LAB — HEMOGLOBIN A1C: Hgb A1c MFr Bld: 7.4 % — ABNORMAL HIGH (ref 4.6–6.5)

## 2012-12-14 MED ORDER — GLUCOSE BLOOD VI STRP: ORAL_STRIP | Status: DC

## 2012-12-14 NOTE — Telephone Encounter (Signed)
Rx faxed and left voicemail letting pt know Rx was faxed

## 2012-12-14 NOTE — Telephone Encounter (Signed)
Done and in IN box to fax  

## 2012-12-14 NOTE — Telephone Encounter (Signed)
Pt left note requesting rx for onetouch Ultra 2 test strips be sent to [facility]. Pt using a One Touch Ultra 2 meter. Pt said cks BS twice daily Please advise.

## 2012-12-18 ENCOUNTER — Ambulatory Visit (INDEPENDENT_AMBULATORY_CARE_PROVIDER_SITE_OTHER): Payer: 59 | Admitting: Obstetrics & Gynecology

## 2012-12-18 ENCOUNTER — Encounter: Payer: Self-pay | Admitting: Obstetrics & Gynecology

## 2012-12-18 VITALS — BP 126/85 | HR 67 | Ht 63.0 in | Wt 202.0 lb

## 2012-12-18 DIAGNOSIS — N952 Postmenopausal atrophic vaginitis: Secondary | ICD-10-CM

## 2012-12-18 DIAGNOSIS — Z1239 Encounter for other screening for malignant neoplasm of breast: Secondary | ICD-10-CM

## 2012-12-18 DIAGNOSIS — Z01419 Encounter for gynecological examination (general) (routine) without abnormal findings: Secondary | ICD-10-CM

## 2012-12-18 MED ORDER — ESTRADIOL 0.1 MG/GM VA CREA: TOPICAL_CREAM | VAGINAL | Status: DC

## 2012-12-18 NOTE — Patient Instructions (Signed)
Conjugated Estrogens vaginal cream What is this medicine? CONJUGATED ESTROGENS (CON ju gate ed ESS troe jenz) are a mixture of female hormones. This cream can help relieve symptoms associated with menopause.like vaginal dryness and irritation. This medicine may be used for other purposes; ask your health care provider or pharmacist if you have questions. What should I tell my health care provider before I take this medicine? They need to know if you have any of these conditions: -abnormal vaginal bleeding -blood vessel disease or blood clots -breast, cervical, endometrial, or uterine cancer -dementia -diabetes -gallbladder disease -heart disease or recent heart attack -high blood pressure -high cholesterol -high level of calcium in the blood -hysterectomy -kidney disease -liver disease -migraine headaches -protein C deficiency -protein S deficiency -stroke -systemic lupus erythematosus (SLE) -tobacco smoker -an unusual or allergic reaction to estrogens other medicines, foods, dyes, or preservatives -pregnant or trying to get pregnant -breast-feeding How should I use this medicine? This medicine is for use in the vagina only. Do not take by mouth. Follow the directions on the prescription label. Use at bedtime unless otherwise directed by your doctor or health care professional. Use the special applicator supplied with the cream. Wash hands before and after use. Fill the applicator with the cream and remove from the tube. Lie on your back, part and bend your knees. Insert the applicator into the vagina and push the plunger to expel the cream into the vagina. Wash the applicator with warm soapy water and rinse well. Use exactly as directed for the complete length of time prescribed. Do not stop using except on the advice of your doctor or health care professional. Talk to your pediatrician regarding the use of this medicine in children. Special care may be needed. A patient package  insert for the product will be given with each prescription and refill. Read this sheet carefully each time. The sheet may change frequently. Overdosage: If you think you have taken too much of this medicine contact a poison control center or emergency room at once. NOTE: This medicine is only for you. Do not share this medicine with others. What if I miss a dose? If you miss a dose, use it as soon as you can. If it is almost time for your next dose, use only that dose. Do not use double or extra doses. What may interact with this medicine? Do not take this medicine with any of the following medications: -aromatase inhibitors like aminoglutethimide, anastrozole, exemestane, letrozole, testolactone This medicine may also interact with the following medications: -barbiturates used for inducing sleep or treating seizures -carbamazepine -grapefruit juice -medicines for fungal infections like itraconazole and ketoconazole -raloxifene or tamoxifen -rifabutin -rifampin -rifapentine -ritonavir -some antibiotics used to treat infections -St. John's Wort -warfarin This list may not describe all possible interactions. Give your health care provider a list of all the medicines, herbs, non-prescription drugs, or dietary supplements you use. Also tell them if you smoke, drink alcohol, or use illegal drugs. Some items may interact with your medicine. What should I watch for while using this medicine? Visit your health care professional for regular checks on your progress. You will need a regular breast and pelvic exam. You should also discuss the need for regular mammograms with your health care professional, and follow his or her guidelines. This medicine can make your body retain fluid, making your fingers, hands, or ankles swell. Your blood pressure can go up. Contact your doctor or health care professional if you feel you are retaining   fluid. If you have any reason to think you are pregnant; stop  taking this medicine at once and contact your doctor or health care professional. Tobacco smoking increases the risk of getting a blood clot or having a stroke, especially if you are more than 52 years old. You are strongly advised not to smoke. If you wear contact lenses and notice visual changes, or if the lenses begin to feel uncomfortable, consult your eye care specialist. If you are going to have elective surgery, you may need to stop taking this medicine beforehand. Consult your health care professional for advice prior to scheduling the surgery. What side effects may I notice from receiving this medicine? Side effects that you should report to your doctor or health care professional as soon as possible: -allergic reactions like skin rash, itching or hives, swelling of the face, lips, or tongue -breast tissue changes or discharge -changes in vision -chest pain -confusion, trouble speaking or understanding -dark urine -general ill feeling or flu-like symptoms -light-colored stools -nausea, vomiting -pain, swelling, warmth in the leg -right upper belly pain -severe headaches -shortness of breath -sudden numbness or weakness of the face, arm or leg -trouble walking, dizziness, loss of balance or coordination -unusual vaginal bleeding -yellowing of the eyes or skin Side effects that usually do not require medical attention (report to your doctor or health care professional if they continue or are bothersome): -hair loss -increased hunger or thirst -increased urination -symptoms of vaginal infection like itching, irritation or unusual discharge -unusually weak or tired This list may not describe all possible side effects. Call your doctor for medical advice about side effects. You may report side effects to FDA at 1-800-FDA-1088. Where should I keep my medicine? Keep out of the reach of children. Store at room temperature between 15 and 30 degrees C (59 and 86 degrees F). Throw away  any unused medicine after the expiration date. NOTE: This sheet is a summary. It may not cover all possible information. If you have questions about this medicine, talk to your doctor, pharmacist, or health care provider.  2013, [employer]. (06/20/2010 9:20:36 AM)  

## 2012-12-18 NOTE — Progress Notes (Signed)
GYNECOLOGY CLINIC ENCOUNTER NOTE  History:  52 y.o. G0P0000 here today for breast exam and pelvic exam.  Had normal physical exam with her PCP, but the PCP did not do a breast or pelvic exam.  Patient is s/p LAVH several years ago.  She also reports having a few episodes of spotting after intercourse which she attributes to vaginal dryness. No other symptoms.  The following portions of the patient's history were reviewed and updated as appropriate: allergies, current medications, past family history, past medical history, past social history, past surgical history and problem list.  Last mammogram was last year, she is up-to-date on colon cancer screening.    Review of Systems:  Pertinent items are noted in HPI.  Objective:  Physical Exam BP 126/85  Pulse 67  Ht 5\' 3"  (1.6 m)  Wt 202 lb (91.627 kg)  BMI 35.79 kg/m2 GENERAL: Well-developed, well-nourished female in no acute distress.  BREASTS: Symmetric in size. No masses, skin changes, nipple drainage, or lymphadenopathy. PELVIC: Normal external female genitalia. Vagina is pink but has some mild atrophy and loss of ruggae.  Scant white discharge. Well-healed vaginal cuff.  No adnexal mass or tenderness.   Assessment & Plan:  Mammogram ordered; will follow up results and manage accordingly. Discussed treatment of vaginal atrophy; she was given samples of RepHresh vaginal lubricant and a prescription for vaginal estrogen.  Hormone therapy side effects discussed. She will return in 3 months for reevaluation.

## 2012-12-21 ENCOUNTER — Ambulatory Visit: Payer: 59 | Admitting: Obstetrics & Gynecology

## 2012-12-21 ENCOUNTER — Encounter: Payer: Self-pay | Admitting: Family Medicine

## 2012-12-21 ENCOUNTER — Ambulatory Visit (INDEPENDENT_AMBULATORY_CARE_PROVIDER_SITE_OTHER): Payer: 59 | Admitting: Family Medicine

## 2012-12-21 VITALS — BP 128/76 | HR 90 | Temp 98.6°F | Ht 63.0 in | Wt 201.0 lb

## 2012-12-21 DIAGNOSIS — Z23 Encounter for immunization: Secondary | ICD-10-CM

## 2012-12-21 DIAGNOSIS — E1169 Type 2 diabetes mellitus with other specified complication: Secondary | ICD-10-CM | POA: Insufficient documentation

## 2012-12-21 DIAGNOSIS — E119 Type 2 diabetes mellitus without complications: Secondary | ICD-10-CM

## 2012-12-21 DIAGNOSIS — E6609 Other obesity due to excess calories: Secondary | ICD-10-CM | POA: Insufficient documentation

## 2012-12-21 DIAGNOSIS — E785 Hyperlipidemia, unspecified: Secondary | ICD-10-CM | POA: Insufficient documentation

## 2012-12-21 DIAGNOSIS — E669 Obesity, unspecified: Secondary | ICD-10-CM

## 2012-12-21 MED ORDER — METFORMIN HCL 500 MG PO TABS: 500.0000 mg | ORAL_TABLET | Freq: Two times a day (BID) | ORAL | Status: DC

## 2012-12-21 NOTE — Assessment & Plan Note (Signed)
Discussed how this problem influences overall health and the risks it imposes  Reviewed plan for weight loss with lower calorie diet (via better food choices and also portion control or program like weight watchers) and exercise building up to or more than 30 minutes 5 days per week including some aerobic activity    

## 2012-12-21 NOTE — Assessment & Plan Note (Addendum)
Lab Results  Component Value Date   HGBA1C 7.4* 12/14/2012   Disc working harder on diet Will begin metformin bid  Tdap and pneumovax today Lab and f/u in 3 mo

## 2012-12-21 NOTE — Patient Instructions (Addendum)
Start metformin for diabetes Continue working on a diabetic diet and exercise and weight loss Avoid red meat/ fried foods/ egg yolks/ fatty breakfast meats/ butter, cheese and high fat dairy/ and shellfish  (for cholesterol) Follow up in 3 months with labs prior  Get your flu shot at work  Make sure to get an annual diabetic eye exam  Tetanus and pneumonia vaccines today

## 2012-12-21 NOTE — Assessment & Plan Note (Signed)
Disc goals for lipids and reasons to control them Rev labs with pt Rev low sat fat diet in detail Disc goal of LDL under 100  Re check 3 mo and f/u

## 2012-12-21 NOTE — Progress Notes (Signed)
Subjective:    Patient ID: [name], female    DOB: 04-Jul-1960, 52 y.o.   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 to go to DM teaching (she has a cousin and sister teaching her what she needs to know) Home sugar results - 106-130 am , and in the afternoon in low 100s  DM diet - better now  Exercise - started that back -- on treadmill - 30 min every evening on the treadmill  Symptoms-none  A1C last  Lab Results  Component Value Date   HGBA1C 7.4* 12/14/2012  this is up from 7.0 in may  Renal protection-not on ace yet -nl bp  midroalb is normal today Last eye exam - was 1-2 years ago   Needs pneumonia vaccine and Td    Lab Results  Component Value Date   CHOL 163 12/14/2012   HDL 39.10 12/14/2012   LDLCALC 115* 12/14/2012   TRIG 45.0 12/14/2012   CHOLHDL 4 12/14/2012   needs to get LDL down  Does not eat greasy foods but had too much shrimp lately     Patient Active Problem List   Diagnosis Date Noted  . Vaginal atrophy 12/18/2012  . Pain of right clavicle 10/13/2012  . Dyspepsia 08/20/2012  . Rectal bleeding 12/04/2011  . Lower back pain 12/04/2011  . ELEVATED BLOOD PRESSURE 06/04/2010  . Diseases of lips 04/10/2010  . ANEMIA, B12 DEFICIENCY 10/05/2008  . THROMBOCYTOPENIA 10/05/2008  . ALLERGIC RHINITIS 10/05/2008  . ARM NUMBNESS 10/05/2008  . Headache 10/05/2008  . FREQUENCY, URINARY 10/05/2008  . DIABETES MELLITUS, MILD 01/26/2008  . ANEMIA, IRON DEFICIENCY 01/26/2008   Past Medical History  Diagnosis Date  . Diabetes mellitus   . Labile blood pressure   . High frequency hearing loss   . Fibroids   . Pelvic pain   . Anemia   .  Hx of migraine headaches   . Arthritis   . Vaginal itching   . History of abnormal Pap smear    Past Surgical History  Procedure Laterality Date  . Laparoscopic assisted vaginal hysterectomy  08/2005    Fibroids, menorrhagia.  Benign pathology  . Bunionectomy    . Colonoscopy  03/2007    neg  . Nasal endoscopy  03/2007    neg  . Flexible sigmoidoscopy  2000  . Leep     History  Substance Use Topics  . Smoking status: Never Smoker   . Smokeless tobacco: Never Used  . Alcohol Use: Yes     Comment: occasional/rare   Family History  Problem Relation Age of Onset  . Heart disease Sister   . Arthritis Sister   . Diabetes Maternal Grandmother   . Hypertension Maternal Grandmother    Allergies  Allergen Reactions  . Amoxicillin-Pot Clavulanate     REACTION: Itchy rash   Current Outpatient Prescriptions on File Prior to Visit  Medication Sig Dispense Refill  . cyclobenzaprine (FLEXERIL) 5 MG tablet Take 1 tablet (5 mg total) by mouth 2 (two) times daily as needed for muscle spasms.  10  tablet  0  . glucose blood (ONE TOUCH ULTRA TEST) test strip Pt checks BS twice daily and as directed. Dx 250.00  100 each  3  . loratadine (CLARITIN) 10 MG tablet Take 10 mg by mouth as needed.       . meloxicam (MOBIC) 15 MG tablet       . naproxen (NAPROSYN) 500 MG tablet TAKE 1 TABLET BY MOUTH TWICE A DAY FOR 1 WEEK THEN AS NEEDED FOR PAIN TAKE WITH FOOD  60 tablet  2  . estradiol (ESTRACE) 0.1 MG/GM vaginal cream Apply 1 gram per vagina every night for 2 weeks, then go to three times a week  30 g  12   No current facility-administered medications on file prior to visit.    Review of Systems Review of Systems  Constitutional: Negative for fever, appetite change, fatigue and unexpected weight change.  ENT pos for some allergy congestion, neg for facial pain  Eyes: Negative for pain and visual disturbance.  Respiratory: Negative for cough and shortness of breath.  neg for wheeze   Cardiovascular: Negative for cp or palpitations    Gastrointestinal: Negative for nausea, diarrhea and constipation.  Genitourinary: Negative for urgency and frequency. neg for excessive thirst  Skin: Negative for pallor or rash   Neurological: Negative for weakness, light-headedness, numbness and headaches.  Hematological: Negative for adenopathy. Does not bruise/bleed easily.  Psychiatric/Behavioral: Negative for dysphoric mood. The patient is not nervous/anxious.         Objective:   Physical Exam  Constitutional: She appears well-developed and well-nourished. No distress.  obese and well appearing   HENT:  Head: Normocephalic and atraumatic.  Mouth/Throat: Oropharynx is clear and moist.  Eyes: Conjunctivae and EOM are normal. Pupils are equal, round, and reactive to light. No scleral icterus.  Neck: Normal range of motion. Neck supple. No JVD present. Carotid bruit is not present. No thyromegaly present.  Cardiovascular: Normal rate, regular rhythm, normal heart sounds and intact distal pulses.  Exam reveals no gallop.   Pulmonary/Chest: Effort normal and breath sounds normal. No respiratory distress. She has no wheezes. She exhibits no tenderness.  Abdominal: Soft. Bowel sounds are normal. She exhibits no distension, no abdominal bruit and no mass. There is no tenderness.  Musculoskeletal: She exhibits no edema.  Lymphadenopathy:    She has no cervical adenopathy.  Neurological: She is alert. She has normal reflexes. No cranial nerve deficit. She exhibits normal muscle tone. Coordination normal.  Skin: Skin is warm and dry. No rash noted. No erythema. No pallor.  Psychiatric: She has a normal mood and affect.          Assessment & Plan:

## 2012-12-24 ENCOUNTER — Encounter: Payer: Self-pay | Admitting: Family Medicine

## 2012-12-25 ENCOUNTER — Encounter: Payer: Self-pay | Admitting: Obstetrics & Gynecology

## 2013-01-01 ENCOUNTER — Telehealth: Payer: Self-pay | Admitting: Family Medicine

## 2013-01-01 MED ORDER — GLIPIZIDE ER 5 MG PO TB24: 5.0000 mg | ORAL_TABLET | Freq: Every day | ORAL | Status: DC

## 2013-01-01 NOTE — Telephone Encounter (Signed)
Med sent to pharmacy.

## 2013-02-04 ENCOUNTER — Other Ambulatory Visit: Payer: Self-pay

## 2013-07-08 ENCOUNTER — Other Ambulatory Visit: Payer: Self-pay

## 2013-08-18 ENCOUNTER — Encounter: Payer: Self-pay | Admitting: Family Medicine

## 2013-08-18 ENCOUNTER — Ambulatory Visit (INDEPENDENT_AMBULATORY_CARE_PROVIDER_SITE_OTHER): Payer: 59 | Admitting: Family Medicine

## 2013-08-18 VITALS — BP 120/88 | HR 80 | Temp 98.0°F | Ht 63.0 in | Wt 200.5 lb

## 2013-08-18 DIAGNOSIS — M25519 Pain in unspecified shoulder: Secondary | ICD-10-CM

## 2013-08-18 DIAGNOSIS — M25511 Pain in right shoulder: Secondary | ICD-10-CM | POA: Insufficient documentation

## 2013-08-18 DIAGNOSIS — B9689 Other specified bacterial agents as the cause of diseases classified elsewhere: Secondary | ICD-10-CM | POA: Insufficient documentation

## 2013-08-18 DIAGNOSIS — J019 Acute sinusitis, unspecified: Secondary | ICD-10-CM

## 2013-08-18 MED ORDER — CYCLOBENZAPRINE HCL 5 MG PO TABS: 5.0000 mg | ORAL_TABLET | Freq: Three times a day (TID) | ORAL | Status: DC | PRN

## 2013-08-18 MED ORDER — LEVOFLOXACIN 500 MG PO TABS: 500.0000 mg | ORAL_TABLET | Freq: Every day | ORAL | Status: DC

## 2013-08-18 NOTE — Patient Instructions (Signed)
For sinus infection take the levaquin as directed  Drink fluids and rest  You can try saline nasal spray to help with congestion  Update if not starting to improve in a week or if worsening    For shoulder - use ice and heat intermittently for 10 minutes at a time Naproxen is ok if it helps  Flexeril - will make you sleepy and relax muscles Make appt on the way out with Dr Lorelei Pont for further evaluation of the shoulder

## 2013-08-18 NOTE — Progress Notes (Signed)
Subjective:    Patient ID: [name], female    DOB: [birth year], 53 y.o.   MRN: [id number]  HPI Here with sinus problems and also R arm pain   Nasal congestion and post nasal drip through allergy season  Coughing a lot - feels phlegm in throat  She spits out thick yellow phlegm and sometimes blows blood out too (nasal)  Takes sinus tylenol or sinus advil for her symptoms (? Perhaps tylenol allergy)  Headache and facial pain  ? If fever  She uses flonase -gets sores in nose -so she does not use it every day Also on claritin   R arm pain since mothers day Points to her shoulder  Pain to raise arm above 90 deg  Wakes her up in the middle of the night  Naproxen is not helping  Xray last summer of clavicle last summer ok  Is right handed - and her job requires lifting with outstretched arm - more than 5 lb -- reped  Patient Active Problem List   Diagnosis Date Noted  . Hyperlipidemia LDL goal < 100 12/21/2012  . Obesity 12/21/2012  . Vaginal atrophy 12/18/2012  . Pain of right clavicle 10/13/2012  . Dyspepsia 08/20/2012  . Rectal bleeding 12/04/2011  . Lower back pain 12/04/2011  . ELEVATED BLOOD PRESSURE 06/04/2010  . Diseases of lips 04/10/2010  . ANEMIA, B12 DEFICIENCY 10/05/2008  . THROMBOCYTOPENIA 10/05/2008  . ALLERGIC RHINITIS 10/05/2008  . ARM NUMBNESS 10/05/2008  . Headache 10/05/2008  . FREQUENCY, URINARY 10/05/2008  . DIABETES MELLITUS, MILD 01/26/2008  . ANEMIA, IRON DEFICIENCY 01/26/2008   Past Medical History  Diagnosis Date  . Diabetes mellitus   . Labile blood pressure   . High frequency hearing loss   . Fibroids   . Pelvic pain   . Anemia   . Hx of migraine headaches   . Arthritis   . Vaginal itching   . History of abnormal Pap smear    Past Surgical History  Procedure Laterality Date  . Laparoscopic assisted vaginal hysterectomy  08/2005    Fibroids, menorrhagia.  Benign pathology  . Bunionectomy    . Colonoscopy  03/2007    neg    . Nasal endoscopy  03/2007    neg  . Flexible sigmoidoscopy  2000  . Leep     History  Substance Use Topics  . Smoking status: Never Smoker   . Smokeless tobacco: Never Used  . Alcohol Use: Yes     Comment: occasional/rare   Family History  Problem Relation Age of Onset  . Heart disease Sister   . Arthritis Sister   . Diabetes Maternal Grandmother   . Hypertension Maternal Grandmother    Allergies  Allergen Reactions  . Amoxicillin-Pot Clavulanate     REACTION: Itchy rash   Current Outpatient Prescriptions on File Prior to Visit  Medication Sig Dispense Refill  . fluticasone (FLONASE) 50 MCG/ACT nasal spray       . glucose blood (ONE TOUCH ULTRA TEST) test strip Pt checks BS twice daily and as directed. Dx 250.00  100 each  3  . loratadine (CLARITIN) 10 MG tablet Take 10 mg by mouth as needed.       . naproxen (NAPROSYN) 500 MG tablet TAKE 1 TABLET BY MOUTH TWICE A DAY FOR 1 WEEK THEN AS NEEDED FOR PAIN TAKE WITH FOOD  60 tablet  2   No current facility-administered medications on file prior to visit.     Review  of Systems Review of Systems  Constitutional: Negative for fever, appetite change, and unexpected weight change.  ENT pos for cong and rhinorrhea and facial pain and purulent sputum Eyes: Negative for pain and visual disturbance.  Respiratory: Negative for wheeze  and shortness of breath.   Cardiovascular: Negative for cp or palpitations    Gastrointestinal: Negative for nausea, diarrhea and constipation.  Genitourinary: Negative for urgency and frequency.  Skin: Negative for pallor or rash   MSK pos for R shoulder pain and stiffness/ neg for joint swelling  Neurological: Negative for weakness, light-headedness, numbness and headaches.  Hematological: Negative for adenopathy. Does not bruise/bleed easily.  Psychiatric/Behavioral: Negative for dysphoric mood. The patient is not nervous/anxious.         Objective:   Physical Exam  Constitutional: She  appears well-developed.  HENT:  Head: Normocephalic and atraumatic.  Right Ear: External ear normal.  Left Ear: External ear normal.  Mouth/Throat: Oropharynx is clear and moist. No oropharyngeal exudate.  Nares are injected and congested   bilat maxillary sinus tenderness  TMs appear full and dull  Eyes: Conjunctivae and EOM are normal. Pupils are equal, round, and reactive to light. Right eye exhibits no discharge. Left eye exhibits no discharge.  Neck: Normal range of motion. Neck supple.  Cardiovascular: Normal rate and regular rhythm.  Exam reveals no gallop.   Pulmonary/Chest: Effort normal and breath sounds normal. No respiratory distress. She has no wheezes. She has no rales.  Musculoskeletal: She exhibits tenderness. She exhibits no edema.       Right shoulder: She exhibits decreased range of motion, tenderness, bony tenderness and pain. She exhibits no swelling, no effusion, no crepitus, normal pulse and normal strength.  Pt cannot abduct over 90 deg due to pain  Limited int/ext rot Pos Hawking/ Neer tests  No swelling Acromion tenderness   Lymphadenopathy:    She has no cervical adenopathy.  Neurological: She is alert. She has normal reflexes. She displays no atrophy. No sensory deficit. She exhibits normal muscle tone.  Skin: Skin is warm and dry. No rash noted. No erythema.  Psychiatric: She has a normal mood and affect.          Assessment & Plan:

## 2013-08-18 NOTE — Progress Notes (Signed)
Pre visit review using our clinic review tool, if applicable. No additional management support is needed unless otherwise documented below in the visit note. 

## 2013-08-19 NOTE — Assessment & Plan Note (Signed)
Cover with levaquin in PCN all pt  Disc symptomatic care - see instructions on AVS  Update if not starting to improve in a week or if worsening

## 2013-08-19 NOTE — Assessment & Plan Note (Signed)
Suspect rotator cuff tendonitis  Limited rom -at risk for frozen shoulder  Has failed consv tx  Ref to sport med for further eval

## 2013-08-26 ENCOUNTER — Ambulatory Visit (INDEPENDENT_AMBULATORY_CARE_PROVIDER_SITE_OTHER)
Admission: RE | Admit: 2013-08-26 | Discharge: 2013-08-26 | Disposition: A | Discharge status: Home / Self Care | Payer: 59 | Source: Ambulatory Visit | Attending: Family Medicine | Admitting: Family Medicine

## 2013-08-26 ENCOUNTER — Encounter: Payer: Self-pay | Admitting: Family Medicine

## 2013-08-26 ENCOUNTER — Ambulatory Visit (INDEPENDENT_AMBULATORY_CARE_PROVIDER_SITE_OTHER): Payer: 59 | Admitting: Family Medicine

## 2013-08-26 VITALS — BP 130/82 | HR 84 | Temp 98.4°F | Ht 63.0 in | Wt 202.5 lb

## 2013-08-26 DIAGNOSIS — M25519 Pain in unspecified shoulder: Secondary | ICD-10-CM

## 2013-08-26 DIAGNOSIS — M25511 Pain in right shoulder: Secondary | ICD-10-CM

## 2013-08-26 MED ORDER — TRAMADOL HCL 50 MG PO TABS
50.0000 mg | ORAL_TABLET | Freq: Four times a day (QID) | ORAL | Status: DC | PRN
Start: 2013-08-26 — End: 2014-05-12

## 2013-08-26 NOTE — Patient Instructions (Signed)
REFERRALS TO SPECIALISTS, SPECIAL TESTS (MRI, CT, ULTRASOUNDS)  GO THE WAITING ROOM AND TELL CHECK IN YOU NEED HELP WITH A REFERRAL. Either MARION or LINDA will help you set it up.  If it is between 1-2 PM they may be at lunch.  After 5 PM, they will likely be at home.  They will call you, so please make sure the office has your correct phone number.  Referrals sometimes can be done same day if urgent, but others can take 2 or 3 days to get an appointment. Starting in 2015, many of the new Medicare insurance plans and Affordable Care Act (Obamacare) Health plans offered take much longer for referrals. They have added additional paperwork and steps.  MRI's and CT's can take up to a week for the test. (Emergencies like strokes take precedence. I will tell you if you have an emergency.)   If your referral is to an in-network [facility] office, their office may contact you directly prior to our office reaching you.  -- Examples: [facility], [facility], [facility],             Neurology, [facility], and many more.  Specialist appointment times vary a great deal, mostly on the specialist's schedule and if they have openings. -- Our office tries to get you in as fast as possible. -- Some specialists have very long wait times. (Example. Dermatology. Usually months) -- If you have a true emergency like new cancer, we work to get you in ASAP.   

## 2013-08-26 NOTE — Progress Notes (Signed)
[place] [address] Phone: [phone] Fax: [phone]  Patient ID: [name] MRN: [id number], DOB: 01/11/1961, 53 y.o. Date of Encounter: 08/26/2013  Primary Physician:  Loura Pardon, MD   Chief Complaint: Shoulder Pain   Subjective:   History of Present Illness:  [name] is a 53 y.o. pleasant patient who presents with the following:  3-4 weeks ago, could not really lift her shoulder much. Ever since then will hurt and pain and keeping her awake. Dropped her glass this morning. Sore and painful arc of motion. On further questioning, this was an abrupt onset injury with acute pain and acute loss of function and strength with a markedly decreased functionality from one day to do next. One day prior, the patient was fine, working fully and normally without any difficulty, and the next day, she was having a tremendous amount of difficulty lifting and abducting her shoulder, she was in a great deal of pain. She thinks that she sustained her injury while she was sleeping and moving in some capacity, then she experienced pain.  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 Small engine - 5 lbs, 10-15 years.   No prior trauma or fx.    Patient Active Problem List   Diagnosis Date Noted  . Acute bacterial sinusitis 08/18/2013  . Right shoulder pain 08/18/2013  . Hyperlipidemia LDL goal < 100 12/21/2012  . Obesity 12/21/2012  . Vaginal atrophy 12/18/2012  . Pain of right clavicle 10/13/2012  . Dyspepsia 08/20/2012  . Rectal bleeding 12/04/2011  . Lower back pain 12/04/2011  . ELEVATED BLOOD PRESSURE 06/04/2010  . Diseases of lips 04/10/2010  . ANEMIA, B12 DEFICIENCY 10/05/2008  . THROMBOCYTOPENIA 10/05/2008  . ALLERGIC RHINITIS 10/05/2008  . ARM NUMBNESS 10/05/2008  . Headache 10/05/2008  . FREQUENCY, URINARY 10/05/2008  . DIABETES MELLITUS, MILD  01/26/2008  . ANEMIA, IRON DEFICIENCY 01/26/2008    Past Medical History  Diagnosis Date  . Diabetes mellitus   . Labile blood pressure   . High frequency hearing loss   . Fibroids   . Pelvic pain   . Anemia   . Hx of migraine headaches   . Arthritis   . Vaginal itching   . History of abnormal Pap smear     Past Surgical History  Procedure Laterality Date  . Laparoscopic assisted vaginal hysterectomy  08/2005    Fibroids, menorrhagia.  Benign pathology  . Bunionectomy    . Colonoscopy  03/2007    neg  . Nasal endoscopy  03/2007    neg  . Flexible sigmoidoscopy  2000  . Leep      History   Social History  . Marital Status: Single    Spouse Name: N/A    Number of Children: N/A  . Years of Education: N/A   Occupational History  . Not on file.   Social History Main Topics  . Smoking status: Never Smoker   . Smokeless tobacco: Never Used  . Alcohol Use: Yes     Comment: occasional/rare  . Drug Use: No  . Sexual Activity: Yes    Partners: Male    Birth Control/ Protection: Surgical     Comment: LAVH   Other Topics Concern  . Not on file   Social History Narrative  . No narrative on file    Family History  Problem Relation Age of Onset  .  Heart disease Sister   . Arthritis Sister   . Diabetes Maternal Grandmother   . Hypertension Maternal Grandmother     Allergies  Allergen Reactions  . Amoxicillin-Pot Clavulanate     REACTION: Itchy rash    Medication list has been reviewed and updated.  Review of Systems:  GEN: No fevers, chills. Nontoxic. Primarily MSK c/o today. MSK: Detailed in the HPI GI: tolerating PO intake without difficulty Neuro: No numbness, parasthesias, or tingling associated. Otherwise the pertinent positives of the ROS are noted above.   Objective:   Physical Examination: BP 130/82  Pulse 84  Temp(Src) 98.4 F (36.9 C) (Oral)  Ht 5\' 3"  (1.6 m)  Wt 202 lb 8 oz (91.853 kg)  BMI 35.88 kg/m2  Ideal Body Weight: Weight  in (lb) to have BMI = 25: 140.8   GEN: WDWN, NAD, Non-toxic, Alert & Oriented x 3 HEENT: Atraumatic, Normocephalic.  Ears and Nose: No external deformity. EXTR: No clubbing/cyanosis/edema NEURO: Normal gait.  PSYCH: Normally interactive. Conversant. Not depressed or anxious appearing.  Calm demeanor.    RIGHT shoulder: Mildly tender at the acromioclavicular joint. Mildly tender at the sternoclavicular joint. Nontender along the clavicle itself.  Nontender along the humerus. Tender in the bicipital groove. Speed's is painful. Yergason's is negative.  The patient is markedly abnormal shoulder mechanics, and she dramatically lists her shoulder blade when attempting to do abduction, but is not able to achieve normal and full abduction. Flexion is slightly better. Strength testing is 3+/5 in abduction.  Internal and external range of motion is 5/5.  Drop test is positive.  Secondary to pain, unable to fully assess Michel Bickers test and Neer testing. Negative sulcus sign.  Radiology: Dg Shoulder Right  08/27/2013   CLINICAL DATA:  Right shoulder pain.  EXAM: RIGHT SHOULDER - 2+ VIEW  COMPARISON:  None.  FINDINGS: There is no evidence of fracture or dislocation. There is no evidence of arthropathy or other focal bone abnormality. Soft tissues are unremarkable.  IMPRESSION: Negative.   Electronically Signed   By: Aletta Edouard M.D.   On: 08/27/2013 08:32    Assessment & Plan:   Shoulder pain, right - Plan: DG Shoulder Right, MR Shoulder Right Wo Contrast  3-4 week history of abrupt onset shoulder pain with dramatically altered shoulder mechanics, loss of strength in the plain of abduction, positive drop test, markedly altered scapular mechanics with attempted abduction which would be most consistent with full-thickness versus partial-thickness supraspinatus tear. She has been on anti-inflammatories, doing range of motion and a basic home exercise program without any improvement in her  symptoms. Recommend MRI of the RIGHT shoulder without contrast to evaluate for full-thickness versus partial-thickness rotator cuff tear to help assess need for operative intervention and a highly functioning RHD 53 year old.  Reviewed additional HEP following Dr. Vianne Bulls protocols.  Follow-up: No Follow-up on file. Unless noted above, the patient is to follow-up if symptoms worsen. Red flags were reviewed with the patient.  New Prescriptions   TRAMADOL (ULTRAM) 50 MG TABLET    Take 1 tablet (50 mg total) by mouth every 6 (six) hours as needed.   Orders Placed This Encounter  Procedures  . DG Shoulder Right  . MR Shoulder Right Wo Contrast    Signed,  Samya Siciliano T. Sanyia Dini, MD, [place]   Patient's Medications  New Prescriptions   TRAMADOL (ULTRAM) 50 MG TABLET    Take 1 tablet (50 mg total) by mouth every 6 (six) hours as needed.  Previous Medications   CYCLOBENZAPRINE (FLEXERIL) 5 MG TABLET    Take 1 tablet (5 mg total) by mouth 3 (three) times daily as needed for muscle spasms (watch out for sedation).   FLUTICASONE (FLONASE) 50 MCG/ACT NASAL SPRAY    Place 2 sprays into both nostrils as needed.    GLUCOSE BLOOD (ONE TOUCH ULTRA TEST) TEST STRIP    Pt checks BS twice daily and as directed. Dx 250.00   LEVOFLOXACIN (LEVAQUIN) 500 MG TABLET    Take 1 tablet (500 mg total) by mouth daily.   LORATADINE (CLARITIN) 10 MG TABLET    Take 10 mg by mouth as needed.    METFORMIN (GLUCOPHAGE) 500 MG TABLET    Take 500 mg by mouth daily.   NAPROXEN (NAPROSYN) 500 MG TABLET    TAKE 1 TABLET BY MOUTH TWICE A DAY FOR 1 WEEK THEN AS NEEDED FOR PAIN TAKE WITH FOOD  Modified Medications   No medications on file  Discontinued Medications   No medications on file

## 2013-08-26 NOTE — Progress Notes (Signed)
Pre visit review using our clinic review tool, if applicable. No additional management support is needed unless otherwise documented below in the visit note. 

## 2013-09-02 ENCOUNTER — Ambulatory Visit
Admission: RE | Admit: 2013-09-02 | Discharge: 2013-09-02 | Disposition: A | Discharge status: Home / Self Care | Payer: 59 | Source: Ambulatory Visit | Attending: Family Medicine | Admitting: Family Medicine

## 2013-09-02 ENCOUNTER — Telehealth: Payer: Self-pay

## 2013-09-02 DIAGNOSIS — M25511 Pain in right shoulder: Secondary | ICD-10-CM

## 2013-09-02 NOTE — Telephone Encounter (Signed)
Already did, reviewed, and made f/u shoulder surgeon appointment.

## 2013-09-02 NOTE — Telephone Encounter (Signed)
Pt left v/m requesting cb about MRI results from today; pt is supposed to go to work on 09/03/13. Pt request cb [id number].

## 2013-09-04 ENCOUNTER — Emergency Department: Payer: Self-pay | Admitting: Internal Medicine

## 2013-09-04 LAB — URINALYSIS, COMPLETE
BILIRUBIN, UR: NEGATIVE
Blood: NEGATIVE
Glucose,UR: 50 mg/dL (ref 0–75)
Ketone: NEGATIVE
Leukocyte Esterase: NEGATIVE
Nitrite: NEGATIVE
PH: 5 (ref 4.5–8.0)
Specific Gravity: 1.034 (ref 1.003–1.030)
Squamous Epithelial: 5
WBC UR: NONE SEEN /HPF (ref 0–5)

## 2013-09-04 LAB — CBC WITH DIFFERENTIAL/PLATELET
BASOS ABS: 0 10*3/uL (ref 0.0–0.1)
Basophil %: 0.2 %
Eosinophil #: 0 10*3/uL (ref 0.0–0.7)
Eosinophil %: 0 %
HCT: 41.2 % (ref 35.0–47.0)
HGB: 13.1 g/dL (ref 12.0–16.0)
Lymphocyte #: 0.4 10*3/uL — ABNORMAL LOW (ref 1.0–3.6)
Lymphocyte %: 5.3 %
MCH: 26.1 pg (ref 26.0–34.0)
MCHC: 31.9 g/dL — ABNORMAL LOW (ref 32.0–36.0)
MCV: 82 fL (ref 80–100)
MONO ABS: 0.5 x10 3/mm (ref 0.2–0.9)
MONOS PCT: 6.8 %
NEUTROS ABS: 6.6 10*3/uL — AB (ref 1.4–6.5)
Neutrophil %: 87.7 %
Platelet: 152 10*3/uL (ref 150–440)
RBC: 5.03 10*6/uL (ref 3.80–5.20)
RDW: 15.1 % — ABNORMAL HIGH (ref 11.5–14.5)
WBC: 7.6 10*3/uL (ref 3.6–11.0)

## 2013-09-04 LAB — COMPREHENSIVE METABOLIC PANEL
ALBUMIN: 3.8 g/dL (ref 3.4–5.0)
ALK PHOS: 82 U/L
AST: 25 U/L (ref 15–37)
Anion Gap: 9 (ref 7–16)
BUN: 12 mg/dL (ref 7–18)
Bilirubin,Total: 0.3 mg/dL (ref 0.2–1.0)
CALCIUM: 9 mg/dL (ref 8.5–10.1)
Chloride: 107 mmol/L (ref 98–107)
Co2: 23 mmol/L (ref 21–32)
Creatinine: 0.7 mg/dL (ref 0.60–1.30)
Glucose: 173 mg/dL — ABNORMAL HIGH (ref 65–99)
OSMOLALITY: 281 (ref 275–301)
Potassium: 4.1 mmol/L (ref 3.5–5.1)
SGPT (ALT): 23 U/L (ref 12–78)
Sodium: 139 mmol/L (ref 136–145)
Total Protein: 7.7 g/dL (ref 6.4–8.2)

## 2013-09-04 LAB — LIPASE, BLOOD: LIPASE: 107 U/L (ref 73–393)

## 2013-10-15 ENCOUNTER — Telehealth: Payer: Self-pay | Admitting: Family Medicine

## 2013-10-15 NOTE — Telephone Encounter (Signed)
Diabetic Bundle.  Needs diabetic f/u and LDL check.  Left vm for pt to return call.

## 2013-11-18 ENCOUNTER — Ambulatory Visit (INDEPENDENT_AMBULATORY_CARE_PROVIDER_SITE_OTHER): Payer: 59 | Admitting: Family Medicine

## 2013-11-18 ENCOUNTER — Encounter: Payer: Self-pay | Admitting: Family Medicine

## 2013-11-18 VITALS — BP 122/90 | HR 67 | Temp 98.2°F | Ht 63.0 in | Wt 197.5 lb

## 2013-11-18 DIAGNOSIS — M75100 Unspecified rotator cuff tear or rupture of unspecified shoulder, not specified as traumatic: Secondary | ICD-10-CM

## 2013-11-18 DIAGNOSIS — S43429A Sprain of unspecified rotator cuff capsule, initial encounter: Secondary | ICD-10-CM

## 2013-11-18 DIAGNOSIS — M7551 Bursitis of right shoulder: Secondary | ICD-10-CM

## 2013-11-18 DIAGNOSIS — M7062 Trochanteric bursitis, left hip: Secondary | ICD-10-CM

## 2013-11-18 DIAGNOSIS — IMO0002 Reserved for concepts with insufficient information to code with codable children: Secondary | ICD-10-CM

## 2013-11-18 DIAGNOSIS — M75111 Incomplete rotator cuff tear or rupture of right shoulder, not specified as traumatic: Secondary | ICD-10-CM

## 2013-11-18 DIAGNOSIS — E119 Type 2 diabetes mellitus without complications: Secondary | ICD-10-CM

## 2013-11-18 DIAGNOSIS — M76899 Other specified enthesopathies of unspecified lower limb, excluding foot: Secondary | ICD-10-CM

## 2013-11-18 NOTE — Patient Instructions (Signed)
YOUTUBE:  Thoracic spine mobility maneuvers  Scapular stabilization

## 2013-11-18 NOTE — Progress Notes (Signed)
Dr. Frederico Hamman T. Yousuf Ager, MD Primary Care and Sports Medicine [place], [address] Phone: [phone] Fax: [phone]  11/18/2013  Patient: [name], MRN: [id number], DOB: Mar 30, 1961  Primary Physician:  Loura Pardon, MD  Chief Complaint: Shoulder Pain  Subjective:   [name] is a 53 y.o. very pleasant female patient who presents with the following:  Pleasant patient who I remember well who is here in followup for RIGHT sided shoulder pain after she had a partial thickness rotator cuff tear about 3 months ago. At that time, I want her to see Dr. Tamera Punt, and he had that point did not recommend surgery, and she never followed up with him. She has had one subacromial injection. She has not been able to do any kind of physical therapy.  Additionally she has some left-sided lateral hip pain. She does do some repetitive movements at work, and this is the only thing that she can think may have brought about her symptoms.  Patient also has mild diabetes mellitus. Lab Results  Component Value Date   HGBA1C 7.4* 12/14/2012    Past Medical History, Surgical History, Social History, Family History, Problem List, Medications, and Allergies have been reviewed and updated if relevant.  GEN: No fevers, chills. Nontoxic. Primarily MSK c/o today. MSK: Detailed in the HPI GI: tolerating PO intake without difficulty Neuro: No numbness, parasthesias, or tingling associated. Otherwise the pertinent positives of the ROS are noted above.   Objective:   BP 122/90  Pulse 67  Temp(Src) 98.2 F (36.8 C) (Oral)  Ht 5\' 3"  (1.6 m)  Wt 197 lb 8 oz (89.585 kg)  BMI 34.99 kg/m2   GEN: Well-developed,well-nourished,in no acute distress; alert,appropriate and cooperative throughout examination HEENT: Normocephalic and atraumatic without obvious abnormalities. Ears, externally no deformities PULM: Breathing comfortably in no respiratory distress EXT: No clubbing,  cyanosis, or edema PSYCH: Normally interactive. Cooperative during the interview. Pleasant. Friendly and conversant. Not anxious or depressed appearing. Normal, full affect.  Shoulder: Inspection: No muscle wasting or winging Ecchymosis/edema: neg  AC joint, scapula, clavicle: NT Cervical spine: NT, full ROM Spurling's: neg Abduction: full, 5/5 Flexion: full, 5/5 IR, full, lift-off: 5/5 ER at neutral: full, 5/5 AC crossover: neg Neer: pos Hawkins: pos Drop Test: neg Empty Can: pos Supraspinatus insertion: mild-mod T Bicipital groove: NT Speed's: neg Yergason's: neg Sulcus sign: neg Scapular dyskinesis: none C5-T1 intact  Neuro: Sensation intact Grip 5/5   HIP EXAM: SIDE: L ROM: Abduction, Flexion, Internal and External range of motion: full Pain with terminal IROM and EROM: no GTB: TTP on the LEFT SLR: NEG Knees: No effusion FABER: NT REVERSE FABER: NT, neg Piriformis: NT at direct palpation Str: flexion: 5/5 abduction: 4+/5 adduction: 5/5 Strength testing non-tender   Radiology: MRI OF THE RIGHT SHOULDER WITHOUT CONTRAST   TECHNIQUE: Multiplanar, multisequence MR imaging of the shoulder was performed. No intravenous contrast was administered.   COMPARISON:  Radiographs 08/26/2013.   FINDINGS: Rotator cuff: Significant infraspinatus and supraspinatus tendinopathy/ tendinosis. There is a deep and fairly extensive articular surface tear involving the supraspinatus tendon with approximately 10.5 mm of laminar retraction of the articular fibers. No full-thickness tear. The subscapularis tendon is intact.   Muscles:  Normal.   Biceps long head:  Intact.   Acromioclavicular Joint: Minimal AC joint degenerative changes. The acromion is type 1-2 in shape. Mild lateral downsloping but no undersurface spurring.   Glenohumeral Joint: No significant degenerative changes or joint effusion. Mild synovitis.  Labrum:  Mild labral degenerative changes without  obvious tear.   Bones:  No acute bony findings.   Mild subacromial/subdeltoid bursitis.   IMPRESSION: 1. Significant supraspinatus and infraspinatus tendinopathy/tendinosis with a fairly deep articular surface tear involving the supraspinatus tendon with 10.5 mm of laminar retraction of the articular fibers. 2. Intact biceps tendon and grossly normal glenoid labrum. 3. No significant findings for bony impingement. 4. Mild subacromial/subdeltoid bursitis.     Electronically Signed   By: Kalman Jewels M.D.   On: 09/02/2013 13:19 RIGHT SHOULDER - 2+ VIEW   COMPARISON:  None.   FINDINGS: There is no evidence of fracture or dislocation. There is no evidence of arthropathy or other focal bone abnormality. Soft tissues are unremarkable.   IMPRESSION: Negative.     Electronically Signed   By: Aletta Edouard M.D.   On: 08/27/2013 08:32   Assessment and Plan:   Partial tear of right rotator cuff  Greater trochanteric bursitis, left  Subacromial bursitis, right  DIABETES MELLITUS, MILD  She appears quite a bit better compared to last time I saw her. Her strength is improving. I would suspect they're partial thickness cuff tear it has healed and scarred down so now, she is still having some pain secondary to tendinopathy and subacromial bursitis. I wouldn't do a subacromial injection on her today. Also going to give her some video still wants to do for shoulder rehabilitation.  She also has classic trochanteric bursitis on the LEFT. These are often do the repetitive motion injuries. She described a repetitive motion at work where she is rotating and squatting easing her hips and legs which could precipitate his.  A rehabilitation program from the [employer] was reviewed with the patient face to face for their condition. Hip.  SubAC Injection. RIGHT Verbal consent was obtained from the patient. Risks (including rare infection), benefits, and  alternatives were explained. Patient prepped with Chloraprep and Ethyl Chloride used for anesthesia. The subacromial space was injected using the posterior approach. The patient tolerated the procedure well and had decreased pain post injection. No complications. Injection: 8 cc of Lidocaine 1% and Depo-Medrol 40 mg. Needle: 22 gauge   Trochanteric Bursitis Injection, LEFT Verbal consent obtained. Risks (including infection, potential atrophy), benefits, and alternatives reviewed. Greater trochanter sterilely prepped with Chloraprep. Ethyl Chloride used for anesthesia. 8 cc of Lidocaine 1% injected with Depo-Medrol 40 mg into trochanteric bursa at area of maximal tenderness at greater trochanter. Needle taken to bone to troch bursa, flows easily. Bursa massaged. No bleeding and no complications. Decreased pain after injection. Needle: 22 gauge spinal needle   Patient Instructions  YOUTUBE:  Thoracic spine mobility maneuvers  Scapular stabilization      Signed,  Yona Kosek T. Deolinda Frid, MD, [place]   Patient's Medications  New Prescriptions   No medications on file  Previous Medications   CYCLOBENZAPRINE (FLEXERIL) 5 MG TABLET    Take 1 tablet (5 mg total) by mouth 3 (three) times daily as needed for muscle spasms (watch out for sedation).   FLUTICASONE (FLONASE) 50 MCG/ACT NASAL SPRAY    Place 2 sprays into both nostrils as needed.    GLUCOSE BLOOD (ONE TOUCH ULTRA TEST) TEST STRIP    Pt checks BS twice daily and as directed. Dx 250.00   LORATADINE (CLARITIN) 10 MG TABLET    Take 10 mg by mouth as needed.    METFORMIN (GLUCOPHAGE) 500 MG TABLET    Take 500 mg by mouth  daily.   NAPROXEN (NAPROSYN) 500 MG TABLET    TAKE 1 TABLET BY MOUTH TWICE A DAY FOR 1 WEEK THEN AS NEEDED FOR PAIN TAKE WITH FOOD   TRAMADOL (ULTRAM) 50 MG TABLET    Take 1 tablet (50 mg total) by mouth every 6 (six) hours as needed.  Modified Medications   No medications on file  Discontinued Medications    LEVOFLOXACIN (LEVAQUIN) 500 MG TABLET    Take 1 tablet (500 mg total) by mouth daily.

## 2013-11-18 NOTE — Progress Notes (Signed)
Pre visit review using our clinic review tool, if applicable. No additional management support is needed unless otherwise documented below in the visit note. 

## 2014-05-12 ENCOUNTER — Encounter: Payer: Self-pay | Admitting: Family Medicine

## 2014-05-12 ENCOUNTER — Ambulatory Visit (INDEPENDENT_AMBULATORY_CARE_PROVIDER_SITE_OTHER)
Admission: RE | Admit: 2014-05-12 | Discharge: 2014-05-12 | Disposition: A | Discharge status: Home / Self Care | Payer: 59 | Source: Ambulatory Visit | Attending: Family Medicine | Admitting: Family Medicine

## 2014-05-12 ENCOUNTER — Ambulatory Visit (INDEPENDENT_AMBULATORY_CARE_PROVIDER_SITE_OTHER): Payer: 59 | Admitting: Family Medicine

## 2014-05-12 VITALS — BP 142/92 | HR 82 | Temp 98.1°F | Ht 63.0 in | Wt 204.4 lb

## 2014-05-12 DIAGNOSIS — M79645 Pain in left finger(s): Secondary | ICD-10-CM

## 2014-05-12 DIAGNOSIS — J309 Allergic rhinitis, unspecified: Secondary | ICD-10-CM

## 2014-05-12 DIAGNOSIS — M79646 Pain in unspecified finger(s): Secondary | ICD-10-CM | POA: Insufficient documentation

## 2014-05-12 LAB — SEDIMENTATION RATE: SED RATE: 17 mm/h (ref 0–22)

## 2014-05-12 LAB — CBC WITH DIFFERENTIAL/PLATELET
BASOS PCT: 0.5 % (ref 0.0–3.0)
Basophils Absolute: 0 10*3/uL (ref 0.0–0.1)
EOS PCT: 3 % (ref 0.0–5.0)
Eosinophils Absolute: 0.1 10*3/uL (ref 0.0–0.7)
HCT: 36 % (ref 36.0–46.0)
Hemoglobin: 12 g/dL (ref 12.0–15.0)
Lymphocytes Relative: 42.1 % (ref 12.0–46.0)
Lymphs Abs: 1.6 10*3/uL (ref 0.7–4.0)
MCHC: 33.2 g/dL (ref 30.0–36.0)
MCV: 78.3 fl (ref 78.0–100.0)
MONO ABS: 0.4 10*3/uL (ref 0.1–1.0)
Monocytes Relative: 9.7 % (ref 3.0–12.0)
NEUTROS PCT: 44.7 % (ref 43.0–77.0)
Neutro Abs: 1.7 10*3/uL (ref 1.4–7.7)
Platelets: 142 10*3/uL — ABNORMAL LOW (ref 150.0–400.0)
RBC: 4.6 Mil/uL (ref 3.87–5.11)
RDW: 16.2 % — ABNORMAL HIGH (ref 11.5–15.5)
WBC: 3.9 10*3/uL — AB (ref 4.0–10.5)

## 2014-05-12 LAB — URIC ACID: URIC ACID, SERUM: 4.3 mg/dL (ref 2.4–7.0)

## 2014-05-12 MED ORDER — TRAMADOL HCL 50 MG PO TABS: 50.0000 mg | ORAL_TABLET | Freq: Three times a day (TID) | ORAL | Status: DC | PRN

## 2014-05-12 MED ORDER — MONTELUKAST SODIUM 10 MG PO TABS: 10.0000 mg | ORAL_TABLET | Freq: Every day | ORAL | Status: DC

## 2014-05-12 MED ORDER — NAPROXEN 500 MG PO TABS: ORAL_TABLET | ORAL | Status: DC

## 2014-05-12 NOTE — Assessment & Plan Note (Signed)
Discussed treatment - continue claritin and nasal saline. Intranasal steroid causing nose sores/bleeding. Will trial singulair. Pt agrees with plan.

## 2014-05-12 NOTE — Assessment & Plan Note (Addendum)
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 Treat with naprosyn 523m bid x 5 days with food then PRN, prescribed tramadol prn breakthrough pain. Placed in L thumb splint.  ?overuse injury. If no improvement over next week, would refer to hand to discuss possible trigger finger injection?

## 2014-05-12 NOTE — Progress Notes (Signed)
Pre visit review using our clinic review tool, if applicable. No additional management support is needed unless otherwise documented below in the visit note. 

## 2014-05-12 NOTE — Patient Instructions (Addendum)
Continue claritin. Trial of singulair for nasal congestion. If effective, may continue daily. 1 month course sent to pharmacy For L thumb - this is either arthritis or tendonitis of thumb. Xray today. Start prescription anti inflammatory today. I've also refilled pain medication.  If no better, we will refer you to hand doctor to discuss possible steroid injection. labwork today to check for gout or infection.

## 2014-05-12 NOTE — Progress Notes (Signed)
BP 142/92 mmHg  Pulse 82  Temp(Src) 98.1 F (36.7 C) (Oral)  Ht 5' 3"  (1.6 m)  Wt 204 lb 6.4 oz (92.715 kg)  BMI 36.22 kg/m2  SpO2 98%   CC: L hand pain, sinus congestion  Subjective:    Patient ID: [name], female    DOB: [birth year], 54 y.o.   MRN: [id number]  HPI: [name] is a 54 y.o. female presenting on 05/12/2014 for Hand Pain and Sinus Problem   2 wk h/o L thumb pain at IP and MCP joints. Initially red and swollen but that has improved. Sometimes IP joint locks in place. Denies inciting trauma/injury. No other joint pains. No prior issues with thumb. Works at [employer]. Has been treating with left over hydrocodone prn. advil did not help. Also used medicated pad from [employer] store. Swelling has improved. No h/o gout. Grandfather with gout. Today finger feels better.  Sinus congestion - ongoing issue for years. treating with nasal saline. She has also tried flonase (but limited by nosebleeds and nasal sores), claritin. No fevers/chills, ear or tooth pain, coughing, no sick contacts at home.  Relevant past medical, surgical, family and social history reviewed and updated as indicated. Interim medical history since our last visit reviewed. Allergies and medications reviewed and updated. Current Outpatient Prescriptions on File Prior to Visit  Medication Sig  . fluticasone (FLONASE) 50 MCG/ACT nasal spray Place 2 sprays into both nostrils as needed.   Marland Kitchen glucose blood (ONE TOUCH ULTRA TEST) test strip Pt checks BS twice daily and as directed. Dx 250.00  . loratadine (CLARITIN) 10 MG tablet Take 10 mg by mouth as needed.   . metFORMIN (GLUCOPHAGE) 500 MG tablet Take 500 mg by mouth daily.  . cyclobenzaprine (FLEXERIL) 5 MG tablet Take 1 tablet (5 mg total) by mouth 3 (three) times daily as needed for muscle spasms (watch out for sedation). (Patient not taking: Reported on 05/12/2014)   No current facility-administered medications on file  prior to visit.    Review of Systems Per HPI unless specifically indicated above     Objective:    BP 142/92 mmHg  Pulse 82  Temp(Src) 98.1 F (36.7 C) (Oral)  Ht 5' 3"  (1.6 m)  Wt 204 lb 6.4 oz (92.715 kg)  BMI 36.22 kg/m2  SpO2 98%  Wt Readings from Last 3 Encounters:  05/12/14 204 lb 6.4 oz (92.715 kg)  11/18/13 197 lb 8 oz (89.585 kg)  08/26/13 202 lb 8 oz (91.853 kg)    Physical Exam  Constitutional: She appears well-developed and well-nourished. No distress.  HENT:  Right Ear: Hearing, tympanic membrane, external ear and ear canal normal.  Left Ear: Hearing, tympanic membrane, external ear and ear canal normal.  Nose: Mucosal edema (nasal mucosal irritation/injection) present. No rhinorrhea.  Mouth/Throat: Uvula is midline and oropharynx is clear and moist. No oropharyngeal exudate, posterior oropharyngeal edema, posterior oropharyngeal erythema or tonsillar abscesses.  Musculoskeletal: She exhibits no edema.  R hand WNL L thumb tender to palpation at radial carpals, at 1st MCP and at 1st IP joints, some locking of IP appreciated as well. No UCL laxity. Neg finkelstein. Tender at flexor tendon with nodularity appreciated palmar 1st MCP  Skin: Skin is warm and dry. No erythema.  Nursing note and vitals reviewed.      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 Treat with naprosyn 561m bid x 5 days with food then PRN, prescribed tramadol prn breakthrough pain. Placed in L thumb splint.  ?overuse injury. If no improvement over next week, would refer to hand to discuss possible trigger finger injection?      Relevant Orders   DG Finger Thumb Left   CBC with Differential/Platelet   Uric acid   Sedimentation rate   Allergic  rhinitis    Discussed treatment - continue claritin and nasal saline. Intranasal steroid causing nose sores/bleeding. Will trial singulair. Pt agrees with plan.          Follow up plan: Return if symptoms worsen or fail to improve.

## 2014-05-16 ENCOUNTER — Telehealth: Payer: Self-pay | Admitting: *Deleted

## 2014-05-16 NOTE — Telephone Encounter (Signed)
Patient left a voicemail requesting the results from her visit last week. Patient stated that she is having thumb pain and needs medication for the pain. Pharmacy [facility].

## 2014-05-17 NOTE — Telephone Encounter (Signed)
plz notify patient as per results released via mychart. We prescribed naprosyn and tramadol last week - how have those helped pain? Continue splint. If she's not any better would offer hand doctor referral.

## 2014-05-17 NOTE — Telephone Encounter (Signed)
Message left advising patient. Advised to call back and give update on tramadol and naprosyn and if she would like referral.

## 2014-06-22 ENCOUNTER — Ambulatory Visit: Payer: 59 | Admitting: Family Medicine

## 2014-08-03 ENCOUNTER — Encounter: Payer: Self-pay | Admitting: Family Medicine

## 2014-08-03 ENCOUNTER — Ambulatory Visit (INDEPENDENT_AMBULATORY_CARE_PROVIDER_SITE_OTHER): Payer: 59 | Admitting: Family Medicine

## 2014-08-03 VITALS — BP 137/80 | HR 85 | Temp 98.3°F | Ht 63.0 in | Wt 205.2 lb

## 2014-08-03 DIAGNOSIS — M65312 Trigger thumb, left thumb: Secondary | ICD-10-CM | POA: Diagnosis not present

## 2014-08-03 DIAGNOSIS — M65949 Unspecified synovitis and tenosynovitis, unspecified hand: Secondary | ICD-10-CM

## 2014-08-03 DIAGNOSIS — M659 Synovitis and tenosynovitis, unspecified: Secondary | ICD-10-CM

## 2014-08-03 DIAGNOSIS — M6588 Other synovitis and tenosynovitis, other site: Secondary | ICD-10-CM | POA: Diagnosis not present

## 2014-08-03 MED ORDER — METHYLPREDNISOLONE ACETATE 40 MG/ML IJ SUSP
20.0000 mg | Freq: Once | INTRAMUSCULAR | Status: AC
  Administered 2014-08-03: 20 mg via INTRA_ARTICULAR

## 2014-08-03 NOTE — Progress Notes (Signed)
Pre visit review using our clinic review tool, if applicable. No additional management support is needed unless otherwise documented below in the visit note. 

## 2014-08-03 NOTE — Progress Notes (Signed)
Dr. Frederico Hamman T. Catalena Stanhope, MD, [facility] [place], [address] Phone: [phone] Fax: [phone]  08/03/2014  Patient: [name], MRN: [id number], DOB: 09/28/1960, 54 y.o.  Primary Physician:  Loura Pardon, MD  Chief Complaint: thumb pain  Subjective:   [name] is a 54 y.o. very pleasant female patient who presents with the following:  L hand: Left trigger finger.  Trigger thumb.  The patient presents after having pain in the volar aspect of her left thumb, primarily in the MCP region and having some intermittent triggering the causes pain for the last several months.  She previously saw one of my partners, and she has tried taking some oral anti-inflammatories.  She has a repetitive job and uses her left hand and thumb extensively.  Past Medical History, Surgical History, Social History, Family History, Problem List, Medications, and Allergies have been reviewed and updated if relevant.  GEN: No fevers, chills. Nontoxic. Primarily MSK c/o today. MSK: Detailed in the HPI GI: tolerating PO intake without difficulty Neuro: No numbness, parasthesias, or tingling associated. Otherwise the pertinent positives of the ROS are noted above.   Objective:   BP 137/80 mmHg  Pulse 85  Temp(Src) 98.3 F (36.8 C) (Oral)  Ht 5\' 3"  (1.6 m)  Wt 205 lb 4 oz (93.101 kg)  BMI 36.37 kg/m2   GEN: WDWN, NAD, Non-toxic, Alert & Oriented x 3 HEENT: Atraumatic, Normocephalic.  Ears and Nose: No external deformity. EXTR: No clubbing/cyanosis/edema NEURO: Normal gait.  PSYCH: Normally interactive. Conversant. Not depressed or anxious appearing.  Calm demeanor.   L hand Ecchymosis or edema: neg ROM wrist/hand/digits: full  Carpals, MCP's, digits: NT Distal Ulna and Radius: NT Ecchymosis or edema: neg No instability Cysts/nodules: neg Digit triggering: LEFT THUMB WITH ADDITIONAL TENOSYNOVITIS Finkelstein's  test: neg Snuffbox tenderness: neg Scaphoid tubercle: NT Resisted supination: NT Full composite fist, no malrotation Grip, all digits: 5/5 str DIPJT: NT PIP JT: NT MCP JT: NT No tenosynovitis Axial load test: neg Phalen's: neg Tinel's: neg Atrophy: neg  Hand sensation: intact   Radiology: No results found.  Assessment and Plan:   Trigger thumb, left - Plan: methylPREDNISolone acetate (DEPO-MEDROL) injection 20 mg  Tenosynovitis of thumb - Plan: methylPREDNISolone acetate (DEPO-MEDROL) injection 20 mg   Using an anatomical model, I reviewed with the patent the structures involved and how they related to their diagnosis .  We discussed the pathophysiology of trigger fingers. Discussed the inflammatory nature of nodule creation and likely nodule abutting the A1 pulley system, this causing the patient's discomfort and sensations. We discussed that treatments for this include direct injection into the tendon sheath to attempt to shrink catching tissue. This can be done 1-2 times. Other treatments include surgical release. If the patient fails to trigger finger injections, I would recommend trigger finger release if the patient desires relief of the symptoms.   Trigger Finger Injection, LEFT THUMB Verbal consent was obtained. Risks (including rare risk of infection, potential risk for skin lightening and potential atrophy), benefits and alternatives were discussed. Prepped with Chloraprep and Ethyl Chloride used for anesthesia. Under sterile conditions, patient injected at palmar crease aiming distally with 45 degree angle towards nodule; injected directly into tendon sheath. Medication flowed freely without resistance.  Needle size: 22 gauge 1 1/2 inch Injection: 1/2 cc of Lidocaine 1% and Depo-Medrol 20 mg   Signed,  Lilienne Weins T. Lalani Winkles, MD   Patient's Medications  New Prescriptions  No medications on file  Previous Medications   CYCLOBENZAPRINE (FLEXERIL) 5 MG TABLET    Take  1 tablet (5 mg total) by mouth 3 (three) times daily as needed for muscle spasms (watch out for sedation).   GLUCOSE BLOOD (ONE TOUCH ULTRA TEST) TEST STRIP    Pt checks BS twice daily and as directed. Dx 250.00   LORATADINE (CLARITIN) 10 MG TABLET    Take 10 mg by mouth as needed.    METFORMIN (GLUCOPHAGE) 500 MG TABLET    Take 500 mg by mouth daily.   NAPROXEN (NAPROSYN) 500 MG TABLET    Take one tablet twice daily with food for 5 days then as needed   TRAMADOL (ULTRAM) 50 MG TABLET    Take 1 tablet (50 mg total) by mouth 3 (three) times daily as needed.  Modified Medications   No medications on file  Discontinued Medications   FLUTICASONE (FLONASE) 50 MCG/ACT NASAL SPRAY    Place 2 sprays into both nostrils as needed.    MONTELUKAST (SINGULAIR) 10 MG TABLET    Take 1 tablet (10 mg total) by mouth at bedtime.

## 2014-09-20 ENCOUNTER — Telehealth: Payer: Self-pay

## 2014-09-20 NOTE — Telephone Encounter (Signed)
Diabetic Bundle. Left voicemail advising pt her A1C blood test is due. Pt advised to contact PCP's office to scheduled.

## 2014-09-26 ENCOUNTER — Other Ambulatory Visit: Payer: Self-pay

## 2015-01-11 ENCOUNTER — Ambulatory Visit: Payer: 59 | Admitting: Family Medicine

## 2015-01-12 ENCOUNTER — Ambulatory Visit: Payer: 59 | Admitting: Family Medicine

## 2015-01-12 ENCOUNTER — Encounter: Payer: Self-pay | Admitting: Family Medicine

## 2015-01-12 ENCOUNTER — Ambulatory Visit (INDEPENDENT_AMBULATORY_CARE_PROVIDER_SITE_OTHER): Payer: 59 | Admitting: Family Medicine

## 2015-01-12 VITALS — BP 147/84 | HR 88 | Temp 98.2°F | Ht 63.0 in | Wt 203.5 lb

## 2015-01-12 DIAGNOSIS — R252 Cramp and spasm: Secondary | ICD-10-CM | POA: Diagnosis not present

## 2015-01-12 DIAGNOSIS — M65312 Trigger thumb, left thumb: Secondary | ICD-10-CM

## 2015-01-12 DIAGNOSIS — J3489 Other specified disorders of nose and nasal sinuses: Secondary | ICD-10-CM

## 2015-01-12 DIAGNOSIS — R5383 Other fatigue: Secondary | ICD-10-CM

## 2015-01-12 DIAGNOSIS — M654 Radial styloid tenosynovitis [de Quervain]: Secondary | ICD-10-CM | POA: Diagnosis not present

## 2015-01-12 MED ORDER — DICLOFENAC SODIUM 1.5 % TD SOLN: TRANSDERMAL | Status: DC

## 2015-01-12 MED ORDER — METHYLPREDNISOLONE ACETATE 40 MG/ML IJ SUSP
20.0000 mg | Freq: Once | INTRAMUSCULAR | Status: AC
  Administered 2015-01-12: 20 mg via INTRA_ARTICULAR

## 2015-01-12 NOTE — Progress Notes (Signed)
Pre visit review using our clinic review tool, if applicable. No additional management support is needed unless otherwise documented below in the visit note. 

## 2015-01-12 NOTE — Patient Instructions (Addendum)
[place]

## 2015-01-12 NOTE — Progress Notes (Signed)
Dr. Frederico Hamman T. Vennie Waymire, MD, [facility] [place], [address] Phone: [phone] Fax: [phone]  01/12/2015  Patient: [name], MRN: [id number], DOB: 01/29/1961, 54 y.o.  Primary Physician:  Loura Pardon, MD  Chief Complaint: Thumb Pain; Leg Cramps; and Sinus Drainage  Subjective:   [name] is a 54 y.o. very pleasant female patient who presents with the following:  Multiple issues:  Trigger: The patient also complains of 1st L trigger for his been ongoing for months that is causing her some discomfort. There is a mechanical clicking and catching on the dorsal aspect of the hand this is precipitated by simple movement. A nodule is palpable. She has had 1 prior injection. She has had no operative interventions or no specific trauma that she can recall.  DeQ: Patient complains of pain in the first dorsal compartment just proximal to the R [facility] joint, but primarily in the extensor aspect of the thumb. There is some fullness in this area. She has not had any occult trauma. There is no ecchymosis. There is a mild amount of swelling. There is no significant CMC pain. No prior history of trauma or surgery in the affected hand. Repetitive motions at work.  Sinus congestion, pain. Ongoing, the patient does have some issues with some sinus congestion  Leg cramps. She also has been having issues with cramping, particularly at nighttime.  She does work on her feet on concrete, and she is been doing this for about 14 years.  Basic Metabolic Panel:    Component Value Date/Time   NA 139 09/04/2013 0459   NA 139 12/14/2012 0821   NA 139 12/01/2008 1304   K 4.1 09/04/2013 0459   K 4.4 12/14/2012 0821   K 3.8 12/01/2008 1304   CL 107 09/04/2013 0459   CL 109 12/14/2012 0821   CL 105 12/01/2008 1304   CO2 23 09/04/2013 0459   CO2 28 12/14/2012 0821   CO2 27 12/01/2008 1304   BUN 12 09/04/2013 0459   BUN 15  12/14/2012 0821   BUN 13 12/01/2008 1304   CREATININE 0.70 09/04/2013 0459   CREATININE 0.7 12/14/2012 0821   CREATININE 0.8 12/01/2008 1304   GLUCOSE 173* 09/04/2013 0459   GLUCOSE 147* 12/14/2012 0821   GLUCOSE 168* 12/01/2008 1304   CALCIUM 9.0 09/04/2013 0459   CALCIUM 9.0 12/14/2012 0821   CALCIUM 9.2 12/01/2008 1304     Pulses  Ferritin?  08/03/2014 Last OV with Owens Loffler, MD  L hand: Left trigger finger.  Trigger thumb.  The patient presents after having pain in the volar aspect of her left thumb, primarily in the MCP region and having some intermittent triggering the causes pain for the last several months.  She previously saw one of my partners, and she has tried taking some oral anti-inflammatories.  She has a repetitive job and uses her left hand and thumb extensively.  Past Medical History, Surgical History, Social History, Family History, Problem List, Medications, and Allergies have been reviewed and updated if relevant.  Patient Active Problem List   Diagnosis Date Noted  . Thumb pain 05/12/2014  . Right shoulder pain 08/18/2013  . Hyperlipidemia LDL goal < 100 12/21/2012  . Obesity 12/21/2012  . Vaginal atrophy 12/18/2012  . Pain of right clavicle 10/13/2012  . Dyspepsia 08/20/2012  . Rectal bleeding 12/04/2011  . Lower back pain 12/04/2011  . ELEVATED BLOOD PRESSURE 06/04/2010  . Diseases of  lips 04/10/2010  . ANEMIA, B12 DEFICIENCY 10/05/2008  . THROMBOCYTOPENIA 10/05/2008  . Allergic rhinitis 10/05/2008  . ARM NUMBNESS 10/05/2008  . Headache(784.0) 10/05/2008  . FREQUENCY, URINARY 10/05/2008  . DIABETES MELLITUS, MILD 01/26/2008  . ANEMIA, IRON DEFICIENCY 01/26/2008    Past Medical History  Diagnosis Date  . Diabetes mellitus   . Labile blood pressure   . High frequency hearing loss   . Fibroids   . Pelvic pain   . Anemia   . Hx of migraine headaches   . Arthritis   . Vaginal itching   . History of abnormal Pap smear     Past  Surgical History  Procedure Laterality Date  . Laparoscopic assisted vaginal hysterectomy  08/2005    Fibroids, menorrhagia.  Benign pathology  . Bunionectomy    . Colonoscopy  03/2007    neg  . Nasal endoscopy  03/2007    neg  . Flexible sigmoidoscopy  2000  . [place]      Social History   Social History  . Marital Status: Single    Spouse Name: N/A  . Number of Children: N/A  . Years of Education: N/A   Occupational History  . Not on file.   Social History Main Topics  . Smoking status: Never Smoker   . Smokeless tobacco: Never Used  . Alcohol Use: Yes     Comment: occasional/rare  . Drug Use: No  . Sexual Activity:    Partners: Male    Birth Control/ Protection: Surgical     Comment: LAVH   Other Topics Concern  . Not on file   Social History Narrative    Family History  Problem Relation Age of Onset  . Heart disease Sister   . Arthritis Sister   . Diabetes Maternal Grandmother   . Hypertension Maternal Grandmother     Allergies  Allergen Reactions  . Amoxicillin-Pot Clavulanate     REACTION: Itchy rash    Medication list reviewed and updated in full in [facility].   GEN: No fevers, chills. Nontoxic. Primarily MSK c/o today. MSK: Detailed in the HPI GI: tolerating PO intake without difficulty Neuro: No numbness, parasthesias, or tingling associated. Otherwise the pertinent positives of the ROS are noted above.   Objective:   BP 147/84 mmHg  Pulse 88  Temp(Src) 98.2 F (36.8 C) (Oral)  Ht 5\' 3"  (1.6 m)  Wt 203 lb 8 oz (92.307 kg)  BMI 36.06 kg/m2   GEN: WDWN, NAD, Non-toxic, Alert & Oriented x 3 HEENT: Atraumatic, Normocephalic.  Ears and Nose: No external deformity. EXTR: No clubbing/cyanosis/edema NEURO: Normal gait.  PSYCH: Normally interactive. Conversant. Not depressed or anxious appearing.  Calm demeanor.   L hand Ecchymosis or edema: neg ROM wrist/hand/digits: full  Carpals, MCP's, digits: NT Distal Ulna and Radius:  NT Ecchymosis or edema: neg No instability Cysts/nodules: neg Digit triggering: LEFT THUMB WITH ADDITIONAL TENOSYNOVITIS Finkelstein's test: + on the R Snuffbox tenderness: neg Scaphoid tubercle: NT Resisted supination: NT Full composite fist, no malrotation Grip, all digits: 5/5 str DIPJT: NT PIP JT: NT MCP JT: NT No tenosynovitis Axial load test: neg Phalen's: neg Tinel's: neg Atrophy: neg  Hand sensation: intact   Feet pulses 2+  Radiology: No results found.  Assessment and Plan:   De Quervain's tenosynovitis, right  Cramp of both lower extremities - Plan: CBC with Differential/Platelet, Ferritin  Other fatigue - Plan: CBC with Differential/Platelet, Ferritin  Trigger thumb, left - Plan: methylPREDNISolone acetate (DEPO-MEDROL) injection  20 mg  Sinus pressure  We reviewed the hand anatomy in [employer] and reviewed the components involved.  Ice bid for 2-3 days at a minimum Place the thumb in a thumb spica splint - she wants to buy her own. Hand exercises in a week or so - stress ball then opening hand with rubber bands  If continues to do poorly, may need to inject the 1st dorsal sheath   Trigger: Using an anatomical model, I reviewed with the patent the structures involved and how they related to their diagnosis .  We discussed the pathophysiology of trigger fingers. Discussed the inflammatory nature of nodule creation and likely nodule abutting the A1 pulley system, this causing the patient's discomfort and sensations. We discussed that treatments for this include direct injection into the tendon sheath to attempt to shrink catching tissue. This can be done 1-2 times. Other treatments include surgical release. If the patient fails to trigger finger injections, I would recommend trigger finger release if the patient desires relief of the symptoms.   Sinus pressure, more likely allergies  Leg cramps, more likely fatigue, check CBC and  ferritin  Trigger Finger Injection, L Verbal consent was obtained. Risks (including rare risk of infection, potential risk for skin lightening and potential atrophy), benefits and alternatives were discussed. Prepped with Chloraprep and Ethyl Chloride used for anesthesia. Under sterile conditions, patient injected at palmar crease aiming distally with 45 degree angle towards nodule; injected directly into tendon sheath. Medication flowed freely without resistance.  Needle size: 22 gauge 1 1/2 inch Injection: 1/2 cc of Lidocaine 1% and Depo-Medrol 20 mg   Follow-up: prn  New Prescriptions   DICLOFENAC SODIUM 1.5 % SOLN    Apply to affected wrist QID   Modified Medications   No medications on file   Orders Placed This Encounter  Procedures  . CBC with Differential/Platelet  . Ferritin    Signed,  Frederico Hamman T. Laquisha Northcraft, MD   Patient's Medications  New Prescriptions   DICLOFENAC SODIUM 1.5 % SOLN    Apply to affected wrist QID  Previous Medications   CYCLOBENZAPRINE (FLEXERIL) 5 MG TABLET    Take 1 tablet (5 mg total) by mouth 3 (three) times daily as needed for muscle spasms (watch out for sedation).   GLUCOSE BLOOD (ONE TOUCH ULTRA TEST) TEST STRIP    Pt checks BS twice daily and as directed. Dx 250.00   LORATADINE (CLARITIN) 10 MG TABLET    Take 10 mg by mouth as needed.    METFORMIN (GLUCOPHAGE) 500 MG TABLET    Take 500 mg by mouth daily.   NAPROXEN (NAPROSYN) 500 MG TABLET    Take one tablet twice daily with food for 5 days then as needed   TRAMADOL (ULTRAM) 50 MG TABLET    Take 1 tablet (50 mg total) by mouth 3 (three) times daily as needed.  Modified Medications   No medications on file  Discontinued Medications   No medications on file

## 2015-01-13 LAB — CBC WITH DIFFERENTIAL/PLATELET
Basophils Absolute: 0 10*3/uL (ref 0.0–0.1)
Basophils Relative: 0.5 % (ref 0.0–3.0)
EOS PCT: 2.1 % (ref 0.0–5.0)
Eosinophils Absolute: 0.1 10*3/uL (ref 0.0–0.7)
HEMATOCRIT: 36.1 % (ref 36.0–46.0)
Hemoglobin: 11.7 g/dL — ABNORMAL LOW (ref 12.0–15.0)
LYMPHS PCT: 45.5 % (ref 12.0–46.0)
Lymphs Abs: 2.2 10*3/uL (ref 0.7–4.0)
MCHC: 32.3 g/dL (ref 30.0–36.0)
MCV: 77 fl — AB (ref 78.0–100.0)
MONOS PCT: 9.5 % (ref 3.0–12.0)
Monocytes Absolute: 0.5 10*3/uL (ref 0.1–1.0)
Neutro Abs: 2.1 10*3/uL (ref 1.4–7.7)
Neutrophils Relative %: 42.4 % — ABNORMAL LOW (ref 43.0–77.0)
Platelets: 160 10*3/uL (ref 150.0–400.0)
RBC: 4.69 Mil/uL (ref 3.87–5.11)
RDW: 16.8 % — ABNORMAL HIGH (ref 11.5–15.5)
WBC: 4.9 10*3/uL (ref 4.0–10.5)

## 2015-01-13 LAB — FERRITIN: Ferritin: 13.7 ng/mL (ref 10.0–291.0)

## 2015-01-16 ENCOUNTER — Telehealth: Payer: Self-pay | Admitting: *Deleted

## 2015-01-16 MED ORDER — DOXYCYCLINE HYCLATE 100 MG PO TABS: 100.0000 mg | ORAL_TABLET | Freq: Two times a day (BID) | ORAL | Status: DC

## 2015-01-16 NOTE — Telephone Encounter (Signed)
Joelene Millin notified as instructed by telephone.  Doxycycline Rx sent to [facility]

## 2015-01-16 NOTE — Telephone Encounter (Signed)
-----   Message from Owens Loffler, MD sent at 01/15/2015  8:53 PM EDT ----- Try OTC sinus / cold medicine and   Doxycycline 100 mg, 1 po bid, #20

## 2015-01-30 ENCOUNTER — Telehealth: Payer: Self-pay

## 2015-01-30 NOTE — Telephone Encounter (Signed)
Pt left v/m; pt was seen 01/12/15; rt hand is no better, rt hand is hurting from rt thumb to rt wrist; and pt wants to know if can get med sent to [facility] or do referral to specialist for rt hand pain and swelling. Pt has been wearing wrist brace but no decrease in pain. Pt request cb.

## 2015-01-31 NOTE — Telephone Encounter (Signed)
Left message for [name] to return my call.

## 2015-01-31 NOTE — Telephone Encounter (Signed)
Various ways this could be treated, worked up further. Oral medication likely not of benefit.   I can inject her dequairvain's to see if that works. (different from her trigger finger injection)  If she prefers, we can get her in to see a hand surgeon. Her decision - either way would be fine.

## 2015-02-01 NOTE — Telephone Encounter (Signed)
Joelene Millin notified as instructed by telephone.  She is willing to try the dequairvain injection.  Appointment scheduled 02/09/2015 at 6:00 pm with Dr. Lorelei Pont.

## 2015-02-01 NOTE — Telephone Encounter (Signed)
Left message for Tani to return my call.

## 2015-02-09 ENCOUNTER — Ambulatory Visit: Payer: 59 | Admitting: Family Medicine

## 2015-02-10 ENCOUNTER — Other Ambulatory Visit: Payer: Self-pay | Admitting: Family Medicine

## 2015-02-10 NOTE — Telephone Encounter (Signed)
Please schedule a winter f/u and refill until then thanks

## 2015-02-10 NOTE — Telephone Encounter (Signed)
Pt hasn't been seen in over a year and no future appt, last filled on 08/18/13

## 2015-02-10 NOTE — Telephone Encounter (Signed)
Last filled 08/18/13--please advise

## 2015-02-10 NOTE — Telephone Encounter (Signed)
Pt advise she is due for a f/u with Dr. Glori Bickers, pt declined to schedule a f/u she wants me to send refill request to Dr. Lorelei Pont because she said that's who prescribed med and she has been seeing him recently for her hand/ leg pain, pt said she is okay with waiting until Monday when Dr. Lorelei Pont is in the office, I advise pt I will send this request to Dr. Lorelei Pont and his assistant will call her back next week  Sent to Dr. Glori Bickers as an Catha Gosselin

## 2015-02-11 MED ORDER — CYCLOBENZAPRINE HCL 5 MG PO TABS: 5.0000 mg | ORAL_TABLET | Freq: Three times a day (TID) | ORAL | Status: DC | PRN

## 2015-02-11 NOTE — Telephone Encounter (Signed)
I saw her 1 month ago, multiple times in last year for MSK issues.   I will refill - entirely appropriate.   Electronically Signed  By: Owens Loffler, MD On: 02/11/2015 7:09 AM

## 2015-05-09 ENCOUNTER — Encounter: Payer: Self-pay | Admitting: Internal Medicine

## 2015-05-09 ENCOUNTER — Ambulatory Visit (INDEPENDENT_AMBULATORY_CARE_PROVIDER_SITE_OTHER): Payer: 59 | Admitting: Internal Medicine

## 2015-05-09 VITALS — BP 138/86 | HR 76 | Temp 98.1°F | Wt 202.0 lb

## 2015-05-09 DIAGNOSIS — B029 Zoster without complications: Secondary | ICD-10-CM

## 2015-05-09 MED ORDER — VALACYCLOVIR HCL 1 G PO TABS: 1000.0000 mg | ORAL_TABLET | Freq: Three times a day (TID) | ORAL | Status: DC

## 2015-05-09 MED ORDER — GABAPENTIN 100 MG PO CAPS: 100.0000 mg | ORAL_CAPSULE | Freq: Three times a day (TID) | ORAL | Status: DC

## 2015-05-09 NOTE — Progress Notes (Signed)
Subjective:    Patient ID: [name], female    DOB: 1960-05-11, 55 y.o.   MRN: [id number] HPI  Pt presents to the clinic today with c/o left leg pain. The pain is located in her left lower thigh above the knee and radiates to the left hip. She admits to weakness and hasn't been able to sleep. She describes the pain as constant and achy for 3-4 days. She also notes a rash on her left leg and lower back that burns. She reports she took a bath last night in alcohol and epsom salt. She has tried every pain reliever and muscle relaxer she has been prescribed. This includes Hydrocodone which she says has not helped at all. 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 She denies any new activity or trauma. She did have chicken pox as a child.   Review of Systems  Past Medical History  Diagnosis Date  . Diabetes mellitus   . Labile blood pressure   . High frequency hearing loss   . Fibroids   . Pelvic pain   . Anemia   . Hx of migraine headaches   . Arthritis   . Vaginal itching   . History of abnormal Pap smear     Current Outpatient Prescriptions  Medication Sig Dispense Refill  . cyclobenzaprine (FLEXERIL) 5 MG tablet Take 1 tablet (5 mg total) by mouth 3 (three) times daily as needed for muscle spasms (watch out for sedation). 30 tablet 3  . Diclofenac Sodium 1.5 % SOLN Apply to affected wrist QID 1 Bottle prn  . glucose blood (ONE TOUCH ULTRA TEST) test strip Pt checks BS twice daily and as directed. Dx 250.00 100 each 3  . loratadine (CLARITIN) 10 MG tablet Take 10 mg by mouth as needed.     . metFORMIN (GLUCOPHAGE) 500 MG tablet Take 500 mg by mouth daily.    . naproxen (NAPROSYN) 500 MG tablet Take one tablet twice daily with food for 5 days then as needed 60 tablet 0  . traMADol (ULTRAM) 50 MG tablet Take 1 tablet (50 mg total) by mouth 3 (three) times daily as needed. 50 tablet 0  . gabapentin (NEURONTIN) 100 MG capsule Take 1 capsule (100 mg total) by  mouth 3 (three) times daily. 90 capsule 0  . valACYclovir (VALTREX) 1000 MG tablet Take 1 tablet (1,000 mg total) by mouth 3 (three) times daily. 21 tablet 0   No current facility-administered medications for this visit.    Allergies  Allergen Reactions  . Amoxicillin-Pot Clavulanate     REACTION: Itchy rash    Family History  Problem Relation Age of Onset  . Heart disease Sister   . Arthritis Sister   . Diabetes Maternal Grandmother   . Hypertension Maternal Grandmother     Social History   Social History  . Marital Status: Single    Spouse Name: N/A  . Number of Children: N/A  . Years of Education: N/A   Occupational History  . Not on file.   Social History Main Topics  . Smoking status: Never Smoker   . Smokeless tobacco: Never Used  . Alcohol Use: Yes     Comment: occasional/rare  . Drug Use: No  . Sexual Activity:    Partners: Male    Birth Control/ Protection: Surgical     Comment: LAVH   Other Topics Concern  . Not on file   Social History Narrative  Constitutional: Denies fever, fatigue, headache or abrupt weight changes.  Respiratory: Denies difficulty breathing, shortness of breath, cough or sputum production.   Cardiovascular: Denies chest pain, chest tightness, palpitations or swelling in the hands or feet.  Gastrointestinal: Denies abdominal pain, bloating, constipation, diarrhea or blood in the stool.  Musculoskeletal: Positive for decreased ROM of leg.  Skin: Positive for rash. Denies ulcerations. Neurological:  Pain in left leg that began on lower thigh above the knee and has extended to her left hip. Pain is achy and deep.    No other specific complaints in a complete review of systems (except as listed in HPI above).     Objective:   Physical Exam  BP 138/86 mmHg  Pulse 76  Temp(Src) 98.1 F (36.7 C) (Oral)  Wt 202 lb (91.627 kg)  SpO2 97% Wt Readings from Last 3 Encounters:  05/09/15 202 lb (91.627 kg)  01/12/15 203 lb 8 oz  (92.307 kg)  08/03/14 205 lb 4 oz (93.101 kg)    General: Appears her stated age, in NAD. Skin: Warm, dry and intact. Erythematous, macular rash on left side of lower back, 4cm by 5cm. Small rash on right upper thigh and right LE following L2 dermatome.  Cardiovascular: Normal rate and rhythm. S1,S2 noted.  No murmur, rubs or gallops noted.  Pulmonary/Chest: Normal effort and positive vesicular breath sounds. No respiratory distress. No wheezes, rales or ronchi noted.  Musculoskeletal: Strength 5/5 right LE with full ROM. Strength 3/5 left LE with decreased hip flexion and abduction. No tenderness to palpation of knees B/L. Discomfort with palpation of left hip and left thigh.  Neurological: Pain in left lower extremity and left hip consistent with L2 dermatome.    BMET    Component Value Date/Time   NA 139 09/04/2013 0459   NA 139 12/14/2012 0821   NA 139 12/01/2008 1304   K 4.1 09/04/2013 0459   K 4.4 12/14/2012 0821   K 3.8 12/01/2008 1304   CL 107 09/04/2013 0459   CL 109 12/14/2012 0821   CL 105 12/01/2008 1304   CO2 23 09/04/2013 0459   CO2 28 12/14/2012 0821   CO2 27 12/01/2008 1304   GLUCOSE 173* 09/04/2013 0459   GLUCOSE 147* 12/14/2012 0821   GLUCOSE 168* 12/01/2008 1304   BUN 12 09/04/2013 0459   BUN 15 12/14/2012 0821   BUN 13 12/01/2008 1304   CREATININE 0.70 09/04/2013 0459   CREATININE 0.7 12/14/2012 0821   CREATININE 0.8 12/01/2008 1304   CALCIUM 9.0 09/04/2013 0459   CALCIUM 9.0 12/14/2012 0821   CALCIUM 9.2 12/01/2008 1304   GFRNONAA >60 09/04/2013 0459   GFRNONAA 114 03/28/2008 0917   GFRAA >60 09/04/2013 0459   GFRAA 138 03/28/2008 0917    Lipid Panel     Component Value Date/Time   CHOL 163 12/14/2012 0821   TRIG 45.0 12/14/2012 0821   HDL 39.10 12/14/2012 0821   CHOLHDL 4 12/14/2012 0821   VLDL 9.0 12/14/2012 0821   LDLCALC 115* 12/14/2012 0821    CBC    Component Value Date/Time   WBC 4.9 01/12/2015 1723   WBC 7.6 09/04/2013 0459    WBC 5.0 12/01/2008 1304   RBC 4.69 01/12/2015 1723   RBC 5.03 09/04/2013 0459   RBC 4.89 12/01/2008 1304   HGB 11.7* 01/12/2015 1723   HGB 13.1 09/04/2013 0459   HGB 13.6 12/01/2008 1304   HCT 36.1 01/12/2015 1723   HCT 41.2 09/04/2013 0459   HCT 41.4 12/01/2008  1304   PLT 160.0 01/12/2015 1723   PLT 152 09/04/2013 0459   PLT 149 12/01/2008 1304   MCV 77.0* 01/12/2015 1723   MCV 82 09/04/2013 0459   MCV 85 12/01/2008 1304   MCH 26.1 09/04/2013 0459   MCH 27.9 12/01/2008 1304   MCHC 32.3 01/12/2015 1723   MCHC 31.9* 09/04/2013 0459   MCHC 33.0 12/01/2008 1304   RDW 16.8* 01/12/2015 1723   RDW 15.1* 09/04/2013 0459   RDW 12.5 12/01/2008 1304   LYMPHSABS 2.2 01/12/2015 1723   LYMPHSABS 0.4* 09/04/2013 0459   LYMPHSABS 1.7 12/01/2008 1304   MONOABS 0.5 01/12/2015 1723   MONOABS 0.5 09/04/2013 0459   EOSABS 0.1 01/12/2015 1723   EOSABS 0.0 09/04/2013 0459   EOSABS 0.1 12/01/2008 1304   BASOSABS 0.0 01/12/2015 1723   BASOSABS 0.0 09/04/2013 0459   BASOSABS 0.0 12/01/2008 1304    Hgb A1C Lab Results  Component Value Date   HGBA1C 7.4* 12/14/2012         Assessment & Plan:  Varicella Zoster:  Varicella zoster antibody, IgM Valacyclovir 1000mg  PO TID x 7 days Gabapentin 100mg  PO TID PRN  RTC if symptoms worsen or pain persists.

## 2015-05-09 NOTE — Addendum Note (Signed)
Addended by: Ellamae Sia on: 05/09/2015 02:33 PM   Modules accepted: Orders

## 2015-05-09 NOTE — Patient Instructions (Signed)

## 2015-05-09 NOTE — Progress Notes (Signed)
Pre visit review using our clinic review tool, if applicable. No additional management support is needed unless otherwise documented below in the visit note. 

## 2015-05-10 ENCOUNTER — Encounter: Payer: Self-pay | Admitting: Internal Medicine

## 2015-05-12 ENCOUNTER — Encounter: Payer: Self-pay | Admitting: Family Medicine

## 2015-05-12 ENCOUNTER — Ambulatory Visit (INDEPENDENT_AMBULATORY_CARE_PROVIDER_SITE_OTHER): Payer: 59 | Admitting: Family Medicine

## 2015-05-12 VITALS — BP 142/82 | HR 80 | Temp 98.4°F | Ht 63.0 in | Wt 206.8 lb

## 2015-05-12 DIAGNOSIS — B029 Zoster without complications: Secondary | ICD-10-CM

## 2015-05-12 DIAGNOSIS — J019 Acute sinusitis, unspecified: Secondary | ICD-10-CM | POA: Insufficient documentation

## 2015-05-12 DIAGNOSIS — J0100 Acute maxillary sinusitis, unspecified: Secondary | ICD-10-CM | POA: Diagnosis not present

## 2015-05-12 DIAGNOSIS — Z8619 Personal history of other infectious and parasitic diseases: Secondary | ICD-10-CM | POA: Insufficient documentation

## 2015-05-12 MED ORDER — DOXYCYCLINE HYCLATE 100 MG PO TABS: 100.0000 mg | ORAL_TABLET | Freq: Two times a day (BID) | ORAL | Status: DC

## 2015-05-12 MED ORDER — TRAMADOL HCL 50 MG PO TABS: 50.0000 mg | ORAL_TABLET | Freq: Three times a day (TID) | ORAL | Status: DC | PRN

## 2015-05-12 NOTE — Progress Notes (Signed)
Pre visit review using our clinic review tool, if applicable. No additional management support is needed unless otherwise documented below in the visit note. 

## 2015-05-12 NOTE — Patient Instructions (Signed)
Continue and finish valtrex Continue gabapentin Take tramadol for pain as needed - caution of sedation  For sinus infection-take the doxycycline as directed for a week  Breathe steam Use nasal saline spray or neti pot to help flush sinuses   Update if not starting to improve in a week or if worsening

## 2015-05-12 NOTE — Progress Notes (Signed)
Subjective:    Patient ID: [name], female    DOB: 10-31-1960, 55 y.o.   MRN: [id number]  HPI Here for shingles and sinus symptoms  Seen on 05/09/15 --she had pain the weekend before  Rash started on the day she was seen   Taking valtrex-no missed doses  Using gabapentin  100 mg up to three times daily   Is in the L4 dermatome    Also having some sinus symptoms  Nose was clogged up and bleeding  Sinus pain under eyes  Yellow nasal d/c  Fever initially -not now  Going on more than ten days   Patient Active Problem List   Diagnosis Date Noted  . Thumb pain 05/12/2014  . Right shoulder pain 08/18/2013  . Hyperlipidemia LDL goal < 100 12/21/2012  . Obesity 12/21/2012  . Vaginal atrophy 12/18/2012  . Pain of right clavicle 10/13/2012  . Dyspepsia 08/20/2012  . Rectal bleeding 12/04/2011  . Lower back pain 12/04/2011  . ELEVATED BLOOD PRESSURE 06/04/2010  . Diseases of lips 04/10/2010  . ANEMIA, B12 DEFICIENCY 10/05/2008  . THROMBOCYTOPENIA 10/05/2008  . Allergic rhinitis 10/05/2008  . ARM NUMBNESS 10/05/2008  . Headache(784.0) 10/05/2008  . FREQUENCY, URINARY 10/05/2008  . DIABETES MELLITUS, MILD 01/26/2008  . ANEMIA, IRON DEFICIENCY 01/26/2008   Past Medical History  Diagnosis Date  . Diabetes mellitus   . Labile blood pressure   . High frequency hearing loss   . Fibroids   . Pelvic pain   . Anemia   . Hx of migraine headaches   . Arthritis   . Vaginal itching   . History of abnormal Pap smear    Past Surgical History  Procedure Laterality Date  . Laparoscopic assisted vaginal hysterectomy  08/2005    Fibroids, menorrhagia.  Benign pathology  . Bunionectomy    . Colonoscopy  03/2007    neg  . Nasal endoscopy  03/2007    neg  . Flexible sigmoidoscopy  2000  . Leep     Social History  Substance Use Topics  . Smoking status: Never Smoker   . Smokeless tobacco: Never Used  . Alcohol Use: Yes     Comment: occasional/rare   Family History    Problem Relation Age of Onset  . Heart disease Sister   . Arthritis Sister   . Diabetes Maternal Grandmother   . Hypertension Maternal Grandmother    Allergies  Allergen Reactions  . Amoxicillin-Pot Clavulanate     REACTION: Itchy rash   Current Outpatient Prescriptions on File Prior to Visit  Medication Sig Dispense Refill  . cyclobenzaprine (FLEXERIL) 5 MG tablet Take 1 tablet (5 mg total) by mouth 3 (three) times daily as needed for muscle spasms (watch out for sedation). 30 tablet 3  . Diclofenac Sodium 1.5 % SOLN Apply to affected wrist QID 1 Bottle prn  . gabapentin (NEURONTIN) 100 MG capsule Take 1 capsule (100 mg total) by mouth 3 (three) times daily. 90 capsule 0  . glucose blood (ONE TOUCH ULTRA TEST) test strip Pt checks BS twice daily and as directed. Dx 250.00 100 each 3  . loratadine (CLARITIN) 10 MG tablet Take 10 mg by mouth as needed.     . metFORMIN (GLUCOPHAGE) 500 MG tablet Take 500 mg by mouth daily.    . naproxen (NAPROSYN) 500 MG tablet Take one tablet twice daily with food for 5 days then as needed 60 tablet 0  . traMADol (ULTRAM) 50 MG tablet Take 1  tablet (50 mg total) by mouth 3 (three) times daily as needed. 50 tablet 0  . valACYclovir (VALTREX) 1000 MG tablet Take 1 tablet (1,000 mg total) by mouth 3 (three) times daily. 21 tablet 0   No current facility-administered medications on file prior to visit.     Review of Systems Review of Systems  Constitutional: Negative for fever, appetite change, fatigue and unexpected weight change.  Eyes: Negative for pain and visual disturbance.  Respiratory: Negative for cough and shortness of breath.   Cardiovascular: Negative for cp or palpitations    Gastrointestinal: Negative for nausea, diarrhea and constipation.  Genitourinary: Negative for urgency and frequency.  Skin: Negative for pallor and pos for painful rash   Neurological: Negative for weakness, light-headedness, numbness and headaches.  Hematological:  Negative for adenopathy. Does not bruise/bleed easily.  Psychiatric/Behavioral: Negative for dysphoric mood. The patient is not nervous/anxious.         Objective:   Physical Exam  Constitutional: She appears well-developed and well-nourished. No distress.  obese and well appearing   HENT:  Head: Normocephalic and atraumatic.  Right Ear: External ear normal.  Left Ear: External ear normal.  Mouth/Throat: Oropharynx is clear and moist. No oropharyngeal exudate.  Nares are injected and congested  Bilateral maxillary sinus tenderness  Post nasal drip   Eyes: Conjunctivae and EOM are normal. Pupils are equal, round, and reactive to light. Right eye exhibits no discharge. Left eye exhibits no discharge.  Neck: Normal range of motion. Neck supple.  Cardiovascular: Normal rate and regular rhythm.   Pulmonary/Chest: Effort normal and breath sounds normal. No respiratory distress. She has no wheezes. She has no rales.  Lymphadenopathy:    She has no cervical adenopathy.  Neurological: She is alert. She has normal reflexes. No cranial nerve deficit.  Skin: Skin is warm and dry. Rash noted.  Patchy vesicular rash over L L4 dermatomal distribution  Tender No weeping No signs of bacterial superinfection   Psychiatric: She has a normal mood and affect.          Assessment & Plan:   Problem List Items Addressed This Visit      Respiratory   Acute sinusitis    Cover with doxycycline  Disc symptomatic care - see instructions on AVS  Update if not starting to improve in a week or if worsening          Relevant Medications   doxycycline (VIBRA-TABS) 100 MG tablet     Other   Herpes zoster - Primary    L L4 dermatome with rash ext down leg  Reviewed hx and expectations of shingles Will finish valtrex  Continue low dose gabapentin Given tramadol for pain with warning of sedation  Pt will update if worse or if pain is not well controlled

## 2015-05-14 NOTE — Assessment & Plan Note (Signed)
L L4 dermatome with rash ext down leg  Reviewed hx and expectations of shingles Will finish valtrex  Continue low dose gabapentin Given tramadol for pain with warning of sedation  Pt will update if worse or if pain is not well controlled

## 2015-05-14 NOTE — Assessment & Plan Note (Signed)
Cover with doxycycline  Disc symptomatic care - see instructions on AVS  Update if not starting to improve in a week or if worsening

## 2015-05-15 ENCOUNTER — Encounter: Payer: Self-pay | Admitting: Family Medicine

## 2015-05-16 NOTE — Telephone Encounter (Signed)
Patient left voice mail requesting something for pain.  She was seen 2/7 and 2/10 for shingles.  Pain is persistent.  Please advise.

## 2015-05-17 ENCOUNTER — Telehealth: Payer: Self-pay | Admitting: Family Medicine

## 2015-05-17 MED ORDER — HYDROCODONE-ACETAMINOPHEN 5-325 MG PO TABS: 1.0000 | ORAL_TABLET | Freq: Four times a day (QID) | ORAL | Status: DC | PRN

## 2015-05-17 NOTE — Telephone Encounter (Signed)
Needs stronger medicine for shingles pain  Caution with working or driving - can sedate and make dizzy Do not mix with the tramadol Use a stool softener if needed for constipation

## 2015-05-17 NOTE — Telephone Encounter (Signed)
Pt advise of Dr. Marliss Coots comments and also work note given

## 2015-05-21 ENCOUNTER — Encounter: Payer: Self-pay | Admitting: Family Medicine

## 2015-05-24 ENCOUNTER — Ambulatory Visit: Payer: 59 | Admitting: Family Medicine

## 2015-06-01 ENCOUNTER — Ambulatory Visit: Payer: 59 | Admitting: Family Medicine

## 2015-06-01 ENCOUNTER — Ambulatory Visit (INDEPENDENT_AMBULATORY_CARE_PROVIDER_SITE_OTHER)
Admission: RE | Admit: 2015-06-01 | Discharge: 2015-06-01 | Disposition: A | Discharge status: Home / Self Care | Payer: 59 | Source: Ambulatory Visit | Attending: Internal Medicine | Admitting: Internal Medicine

## 2015-06-01 ENCOUNTER — Ambulatory Visit (INDEPENDENT_AMBULATORY_CARE_PROVIDER_SITE_OTHER): Payer: 59 | Admitting: Internal Medicine

## 2015-06-01 ENCOUNTER — Encounter: Payer: Self-pay | Admitting: Internal Medicine

## 2015-06-01 VITALS — BP 140/82 | HR 74 | Temp 98.8°F | Wt 200.0 lb

## 2015-06-01 DIAGNOSIS — B029 Zoster without complications: Secondary | ICD-10-CM | POA: Diagnosis not present

## 2015-06-01 DIAGNOSIS — M545 Low back pain: Secondary | ICD-10-CM | POA: Diagnosis not present

## 2015-06-01 DIAGNOSIS — M79605 Pain in left leg: Secondary | ICD-10-CM | POA: Diagnosis not present

## 2015-06-01 NOTE — Progress Notes (Signed)
Subjective:    Patient ID: [name], female    DOB: 1960/05/10, 55 y.o.   MRN: [id number]  HPI  Pt presents to the clinic today to follow up shingles. She was seen 2/7 for the same. She was started on Valtrex and Neurontin. She has been having a lot of pain down her left leg. Her Neurontin was increased but that didn't seem to be helping. She saw Dr. Glori Bickers 2/10, given Tramadol. Tramadol is not effective, so pt emailed Dr. Glori Bickers who gave her a RX for [place]. She has not been getting any relief from the [facility], so she emailed Dr. Glori Bickers on  2/19 to ask about receiving a steroid shot. Dr. Glori Bickers did not think that would be helpful, so she advised her to make a follow up appt with her.  She has finished the Valtrex. She is still taking the Gabapentin, Tramadol and Hydrocodone. It is not helping her pain. She is starting to have GI upset because of all the medication. She has scars on her leg from the lesions. She reports the areas where the lesions were still itch. She would also like to know how to get rid of the scarring.   Review of Systems  Past Medical History  Diagnosis Date  . Diabetes mellitus   . Labile blood pressure   . High frequency hearing loss   . Fibroids   . Pelvic pain   . Anemia   . Hx of migraine headaches   . Arthritis   . Vaginal itching   . History of abnormal Pap smear     Current Outpatient Prescriptions  Medication Sig Dispense Refill  . cyclobenzaprine (FLEXERIL) 5 MG tablet Take 1 tablet (5 mg total) by mouth 3 (three) times daily as needed for muscle spasms (watch out for sedation). 30 tablet 3  . Diclofenac Sodium 1.5 % SOLN Apply to affected wrist QID 1 Bottle prn  . doxycycline (VIBRA-TABS) 100 MG tablet Take 1 tablet (100 mg total) by mouth 2 (two) times daily. 14 tablet 0  . gabapentin (NEURONTIN) 100 MG capsule Take 1 capsule (100 mg total) by mouth 3 (three) times daily. 90 capsule 0  . glucose blood (ONE TOUCH ULTRA TEST) test strip Pt checks  BS twice daily and as directed. Dx 250.00 100 each 3  . HYDROcodone-acetaminophen (NORCO) 5-325 MG tablet Take 1-2 tablets by mouth every 6 (six) hours as needed for moderate pain or severe pain (caution of sedation). 60 tablet 0  . loratadine (CLARITIN) 10 MG tablet Take 10 mg by mouth as needed.     . metFORMIN (GLUCOPHAGE) 500 MG tablet Take 500 mg by mouth daily.    . naproxen (NAPROSYN) 500 MG tablet Take one tablet twice daily with food for 5 days then as needed 60 tablet 0  . traMADol (ULTRAM) 50 MG tablet Take 1-2 tablets (50-100 mg total) by mouth 3 (three) times daily as needed for moderate pain or severe pain (caution of sedation). 60 tablet 1  . valACYclovir (VALTREX) 1000 MG tablet Take 1 tablet (1,000 mg total) by mouth 3 (three) times daily. 21 tablet 0   No current facility-administered medications for this visit.    Allergies  Allergen Reactions  . Amoxicillin-Pot Clavulanate     REACTION: Itchy rash    Family History  Problem Relation Age of Onset  . Heart disease Sister   . Arthritis Sister   . Diabetes Maternal Grandmother   . Hypertension Maternal Grandmother  Social History   Social History  . Marital Status: Single    Spouse Name: N/A  . Number of Children: N/A  . Years of Education: N/A   Occupational History  . Not on file.   Social History Main Topics  . Smoking status: Never Smoker   . Smokeless tobacco: Never Used  . Alcohol Use: Yes     Comment: occasional/rare  . Drug Use: No  . Sexual Activity:    Partners: Male    Birth Control/ Protection: Surgical     Comment: LAVH   Other Topics Concern  . Not on file   Social History Narrative     Constitutional: Denies fever, malaise, fatigue, headache or abrupt weight changes.  Respiratory: Denies difficulty breathing, shortness of breath, cough or sputum production.   Cardiovascular: Denies chest pain, chest tightness, palpitations or swelling in the hands or feet.  Gastrointestinal:  Pt reports nausea. Denies abdominal pain, bloating, constipation, diarrhea or blood in the stool.  GU: Denies urgency, frequency, pain with urination, burning sensation, blood in urine, odor or discharge. Musculoskeletal: Pt reports low back pain with left leg pain. Denies decrease in range of motion, difficulty with gait, muscle pain or joint swelling.  Skin: Pt reports scarring from lesion. Denies redness, rashes, or ulcercations.  Neurological: Pt reports numbness of left thigh.  Denies dizziness, difficulty with memory, difficulty with speech or problems with balance and coordination.    No other specific complaints in a complete review of systems (except as listed in HPI above).     Objective:   Physical Exam  BP 140/82 mmHg  Pulse 74  Temp(Src) 98.8 F (37.1 C) (Oral)  Wt 200 lb (90.719 kg)  SpO2 98% Wt Readings from Last 3 Encounters:  06/01/15 200 lb (90.719 kg)  05/12/15 206 lb 12 oz (93.781 kg)  05/09/15 202 lb (91.627 kg)    General: Appears her stated age, obese in NAD. Skin: Hypopigmented lesion noted starting in midline back extending down left leg in the exact places where her shingles rash was. Musculoskeletal: Normal flexion, extension and rotation of the spine. Some bony tenderness noted over lumbar spine (but this is also where shingles rash started). No difficulty with gait.  Neurological: Alert and oriented. Sensation intact to BLE.   BMET    Component Value Date/Time   NA 139 09/04/2013 0459   NA 139 12/14/2012 0821   NA 139 12/01/2008 1304   K 4.1 09/04/2013 0459   K 4.4 12/14/2012 0821   K 3.8 12/01/2008 1304   CL 107 09/04/2013 0459   CL 109 12/14/2012 0821   CL 105 12/01/2008 1304   CO2 23 09/04/2013 0459   CO2 28 12/14/2012 0821   CO2 27 12/01/2008 1304   GLUCOSE 173* 09/04/2013 0459   GLUCOSE 147* 12/14/2012 0821   GLUCOSE 168* 12/01/2008 1304   BUN 12 09/04/2013 0459   BUN 15 12/14/2012 0821   BUN 13 12/01/2008 1304   CREATININE 0.70  09/04/2013 0459   CREATININE 0.7 12/14/2012 0821   CREATININE 0.8 12/01/2008 1304   CALCIUM 9.0 09/04/2013 0459   CALCIUM 9.0 12/14/2012 0821   CALCIUM 9.2 12/01/2008 1304   GFRNONAA >60 09/04/2013 0459   GFRNONAA 114 03/28/2008 0917   GFRAA >60 09/04/2013 0459   GFRAA 138 03/28/2008 0917    Lipid Panel     Component Value Date/Time   CHOL 163 12/14/2012 0821   TRIG 45.0 12/14/2012 0821   HDL 39.10 12/14/2012 [id number]  CHOLHDL 4 12/14/2012 0821   VLDL 9.0 12/14/2012 0821   LDLCALC 115* 12/14/2012 0821    CBC    Component Value Date/Time   WBC 4.9 01/12/2015 1723   WBC 7.6 09/04/2013 0459   WBC 5.0 12/01/2008 1304   RBC 4.69 01/12/2015 1723   RBC 5.03 09/04/2013 0459   RBC 4.89 12/01/2008 1304   HGB 11.7* 01/12/2015 1723   HGB 13.1 09/04/2013 0459   HGB 13.6 12/01/2008 1304   HCT 36.1 01/12/2015 1723   HCT 41.2 09/04/2013 0459   HCT 41.4 12/01/2008 1304   PLT 160.0 01/12/2015 1723   PLT 152 09/04/2013 0459   PLT 149 12/01/2008 1304   MCV 77.0* 01/12/2015 1723   MCV 82 09/04/2013 0459   MCV 85 12/01/2008 1304   MCH 26.1 09/04/2013 0459   MCH 27.9 12/01/2008 1304   MCHC 32.3 01/12/2015 1723   MCHC 31.9* 09/04/2013 0459   MCHC 33.0 12/01/2008 1304   RDW 16.8* 01/12/2015 1723   RDW 15.1* 09/04/2013 0459   RDW 12.5 12/01/2008 1304   LYMPHSABS 2.2 01/12/2015 1723   LYMPHSABS 0.4* 09/04/2013 0459   LYMPHSABS 1.7 12/01/2008 1304   MONOABS 0.5 01/12/2015 1723   MONOABS 0.5 09/04/2013 0459   EOSABS 0.1 01/12/2015 1723   EOSABS 0.0 09/04/2013 0459   EOSABS 0.1 12/01/2008 1304   BASOSABS 0.0 01/12/2015 1723   BASOSABS 0.0 09/04/2013 0459   BASOSABS 0.0 12/01/2008 1304    Hgb A1C Lab Results  Component Value Date   HGBA1C 7.4* 12/14/2012         Assessment & Plan:   Shingles:  ? PHN She has finished Valtrex No relief with Gabapentin, Tramadol and Norco Advised her to increase Gabapentin to 500 mg TID Advised her not to take Tramadol and Norco  together She can use Hydrocortisone cream OTC on itching lesions Will obtain xray and MRI of lumbar spine  Will follow up after xray/MRI, follow up with PCP if symptoms persist or worsen

## 2015-06-01 NOTE — Progress Notes (Signed)
Pre visit review using our clinic review tool, if applicable. No additional management support is needed unless otherwise documented below in the visit note. 

## 2015-06-01 NOTE — Patient Instructions (Signed)

## 2015-06-04 ENCOUNTER — Other Ambulatory Visit: Payer: Self-pay | Admitting: Internal Medicine

## 2015-06-05 MED ORDER — GABAPENTIN 300 MG PO CAPS: ORAL_CAPSULE | ORAL | Status: DC

## 2015-06-05 NOTE — Telephone Encounter (Signed)
Pt notified of Dr. Marliss Coots comments/ instructions and I advise her how dangerous it is to take more norco then prescribed due to the Tylenol component, pt will try the new dose of gabapentin and follow Dr. Marliss Coots directions

## 2015-06-05 NOTE — Telephone Encounter (Signed)
The 2 norco can not be dosed any more often than every 4 hours (otherwise she may overdose on the tylenol component of it which can be very dangerous)   Let's increase her gabapentin to 300 mg every 8 hours -and if she tolerates that she can gradually increase to 600 mg every 8 hours (as tolerated since it is sedating at first) I will send in the 300 mg pills instead of the 100 mg pills so she does not have to take as many at a time and hopefully this will help much more   (the bottle says 1-2 pills every 8 hours)-please make sure she understands the instructions

## 2015-06-05 NOTE — Telephone Encounter (Signed)
Last filled by Rollene Fare 05/09/2015---please advise if okay for pt to continue

## 2015-06-05 NOTE — Telephone Encounter (Signed)
Please ask her how much she is taking - Regina did increase her dose up to 500 tid as needed  I will refill the appropriate amount  [facility] she is starting to feel better

## 2015-06-05 NOTE — Telephone Encounter (Signed)
Pt said she is still in a lot of pain so she was taking 2 norco and 2 gabapentin almost every 3 hrs. when she is awake, pt knows that was to much med but she said she was taking it like that due to the sever pain she is in, pt said she will try to cut down the dose to what ever you prescribe it for but she is still in a lot of pain

## 2015-06-15 ENCOUNTER — Ambulatory Visit (HOSPITAL_COMMUNITY): Admission: RE | Admit: 2015-06-15 | Payer: Managed Care, Other (non HMO) | Source: Ambulatory Visit

## 2015-06-29 ENCOUNTER — Ambulatory Visit (INDEPENDENT_AMBULATORY_CARE_PROVIDER_SITE_OTHER): Payer: Managed Care, Other (non HMO) | Admitting: Family Medicine

## 2015-06-29 ENCOUNTER — Encounter: Payer: Self-pay | Admitting: Family Medicine

## 2015-06-29 VITALS — BP 124/76 | HR 77 | Temp 98.4°F | Ht 63.0 in | Wt 204.5 lb

## 2015-06-29 DIAGNOSIS — M654 Radial styloid tenosynovitis [de Quervain]: Secondary | ICD-10-CM

## 2015-06-29 DIAGNOSIS — M6530 Trigger finger, unspecified finger: Secondary | ICD-10-CM | POA: Diagnosis not present

## 2015-06-29 NOTE — Progress Notes (Signed)
Pre visit review using our clinic review tool, if applicable. No additional management support is needed unless otherwise documented below in the visit note. 

## 2015-06-29 NOTE — Progress Notes (Signed)
Dr. Frederico Hamman T. Marygrace Sandoval, MD, [facility] [place], [address] Phone: [phone] Fax: [phone]  06/29/2015  Patient: [name], MRN: [id number], DOB: 1960/05/11, 55 y.o.  Primary Physician:  Loura Pardon, MD   Chief Complaint  Patient presents with  . Follow-up    Wrist Pain   Subjective:   [name] is a 55 y.o. very pleasant female patient who presents with the following:  R De Q synovitis: I originally saw her for this about 5 or 6 months ago.  She has tried intermittently using a thumb spica splint, taking  Oral anti-inflammatories and doing some icing as well as rehabilitation exercises, but this is been persistent, and is actually worsened dramatically.  She uses her hands all of the time when she is at work at [employer]  L trigger thumb, failure x 2 injections. Most recently done about 6 months ago, and she is beginning to have some pain and has a nodule on  The flexor tendon near the A1 pulley.  She did have some relief for a while, but with recurrence.  Past Medical History, Surgical History, Social History, Family History, Problem List, Medications, and Allergies have been reviewed and updated if relevant.  Patient Active Problem List   Diagnosis Date Noted  . Herpes zoster 05/12/2015  . Acute sinusitis 05/12/2015  . Thumb pain 05/12/2014  . Right shoulder pain 08/18/2013  . Hyperlipidemia LDL goal < 100 12/21/2012  . Obesity 12/21/2012  . Vaginal atrophy 12/18/2012  . Pain of right clavicle 10/13/2012  . Dyspepsia 08/20/2012  . Rectal bleeding 12/04/2011  . Lower back pain 12/04/2011  . ELEVATED BLOOD PRESSURE 06/04/2010  . Diseases of lips 04/10/2010  . ANEMIA, B12 DEFICIENCY 10/05/2008  . THROMBOCYTOPENIA 10/05/2008  . Allergic rhinitis 10/05/2008  . ARM NUMBNESS 10/05/2008  . Headache(784.0) 10/05/2008  . FREQUENCY, URINARY 10/05/2008  . DIABETES MELLITUS, MILD 01/26/2008  .  ANEMIA, IRON DEFICIENCY 01/26/2008    Past Medical History  Diagnosis Date  . Diabetes mellitus   . Labile blood pressure   . High frequency hearing loss   . Fibroids   . Pelvic pain   . Anemia   . Hx of migraine headaches   . Arthritis   . Vaginal itching   . History of abnormal Pap smear     Past Surgical History  Procedure Laterality Date  . Laparoscopic assisted vaginal hysterectomy  08/2005    Fibroids, menorrhagia.  Benign pathology  . Bunionectomy    . Colonoscopy  03/2007    neg  . Nasal endoscopy  03/2007    neg  . Flexible sigmoidoscopy  2000  . Leep      Social History   Social History  . Marital Status: Single    Spouse Name: N/A  . Number of Children: N/A  . Years of Education: N/A   Occupational History  . Not on file.   Social History Main Topics  . Smoking status: Never Smoker   . Smokeless tobacco: Never Used  . Alcohol Use: Yes     Comment: occasional/rare  . Drug Use: No  . Sexual Activity:    Partners: Male    Birth Control/ Protection: Surgical     Comment: LAVH   Other Topics Concern  . Not on file   Social History Narrative    Family History  Problem Relation Age of Onset  . Heart disease Sister   .  Arthritis Sister   . Diabetes Maternal Grandmother   . Hypertension Maternal Grandmother     Allergies  Allergen Reactions  . Amoxicillin-Pot Clavulanate     REACTION: Itchy rash    Medication list reviewed and updated in full in [facility].  GEN: No fevers, chills. Nontoxic. Primarily MSK c/o today. MSK: Detailed in the HPI GI: tolerating PO intake without difficulty Neuro: No numbness, parasthesias, or tingling associated. Otherwise the pertinent positives of the ROS are noted above.   Objective:   BP 124/76 mmHg  Pulse 77  Temp(Src) 98.4 F (36.9 C) (Oral)  Ht 5\' 3"  (1.6 m)  Wt 204 lb 8 oz (92.761 kg)  BMI 36.23 kg/m2   GEN: WDWN, NAD, Non-toxic, Alert & Oriented x 3 HEENT: Atraumatic, Normocephalic.   Ears and Nose: No external deformity. EXTR: No clubbing/cyanosis/edema NEURO: Normal gait.  PSYCH: Normally interactive. Conversant. Not depressed or anxious appearing.  Calm demeanor.   Hand: B Ecchymosis or edema: neg ROM wrist/hand/digits/elbow: full  Carpals, MCP's, digits: NT Distal Ulna and Radius: NT Supination lift test: neg Ecchymosis or edema: neg Cysts/nodules: L 1ST Finkelstein's test: MARKEDLY POS ON R WITH SWELLING Snuffbox tenderness: neg Scaphoid tubercle: NT Hook of Hamate: NT Resisted supination: NT Full composite fist Grip, all digits: 4/5 str ON R, 5/5 ON L No tenosynovitis Axial load test: neg Phalen's: neg Tinel's: neg Atrophy: neg  Hand sensation: intact   Radiology:  Assessment and Plan:   De Quervain's tenosynovitis, right - Plan: Ambulatory referral to Hand Surgery  Trigger finger, left - Plan: Ambulatory referral to Hand Surgery  Trigger finger failure, status post 2 injections.  Discussion was hand surgeon as appropriate.  6 months of de Quervain's.  This is really her limiting and primary issue and complaint.  Marked pain, and I suspect that a lot of this is due to from overuse due to her occupation, but this is not going to change.  Having a further discussion with hand surgery regarding this as well as also appropriate.  Follow-up: No Follow-up on file.  Orders Placed This Encounter  Procedures  . Ambulatory referral to Hand Surgery    Signed,  Frederico Hamman T. Hanna Aultman, MD   Patient's Medications  New Prescriptions   No medications on file  Previous Medications   CYCLOBENZAPRINE (FLEXERIL) 5 MG TABLET    Take 1 tablet (5 mg total) by mouth 3 (three) times daily as needed for muscle spasms (watch out for sedation).   DICLOFENAC SODIUM 1.5 % SOLN    Apply to affected wrist QID   DOXYCYCLINE (VIBRA-TABS) 100 MG TABLET    Take 1 tablet (100 mg total) by mouth 2 (two) times daily.   GABAPENTIN (NEURONTIN) 100 MG CAPSULE    TAKE 1  CAPSULE (100 MG TOTAL) BY MOUTH 3 (THREE) TIMES DAILY.   GABAPENTIN (NEURONTIN) 300 MG CAPSULE    Take 1-2 pills by mouth every 8 hours   GLUCOSE BLOOD (ONE TOUCH ULTRA TEST) TEST STRIP    Pt checks BS twice daily and as directed. Dx 250.00   HYDROCODONE-ACETAMINOPHEN (NORCO) 5-325 MG TABLET    Take 1-2 tablets by mouth every 6 (six) hours as needed for moderate pain or severe pain (caution of sedation).   LORATADINE (CLARITIN) 10 MG TABLET    Take 10 mg by mouth as needed.    METFORMIN (GLUCOPHAGE) 500 MG TABLET    Take 500 mg by mouth daily.   NAPROXEN (NAPROSYN) 500 MG TABLET  Take one tablet twice daily with food for 5 days then as needed   TRAMADOL (ULTRAM) 50 MG TABLET    Take 1-2 tablets (50-100 mg total) by mouth 3 (three) times daily as needed for moderate pain or severe pain (caution of sedation).   VALACYCLOVIR (VALTREX) 1000 MG TABLET    Take 1 tablet (1,000 mg total) by mouth 3 (three) times daily.  Modified Medications   No medications on file  Discontinued Medications   No medications on file

## 2015-07-10 ENCOUNTER — Other Ambulatory Visit: Payer: Self-pay | Admitting: Family Medicine

## 2015-07-10 NOTE — Telephone Encounter (Signed)
Please confirm with her that she is taking it  Schedule diabetes with me in 2 mo with lab prior and refill until then

## 2015-07-10 NOTE — Telephone Encounter (Signed)
Last time I can tell Rx was refilled was 11/2012, please advise

## 2015-07-11 NOTE — Telephone Encounter (Signed)
Called pt twice and no answer/ phone was busy both times

## 2015-07-20 NOTE — Telephone Encounter (Signed)
Pt said she started back taking med because she is on prednisone and BS has been high in the 200s, med refill and f/u and lab appt scheduled with Dr. Glori Bickers

## 2015-07-20 NOTE — Telephone Encounter (Signed)
Called pt and no answer so left voicemail requesting pt to call office back

## 2015-09-06 ENCOUNTER — Ambulatory Visit: Payer: Managed Care, Other (non HMO) | Admitting: Family Medicine

## 2015-09-07 ENCOUNTER — Encounter: Payer: Self-pay | Admitting: Internal Medicine

## 2015-09-07 ENCOUNTER — Ambulatory Visit (INDEPENDENT_AMBULATORY_CARE_PROVIDER_SITE_OTHER): Payer: Managed Care, Other (non HMO) | Admitting: Internal Medicine

## 2015-09-07 VITALS — BP 140/90 | HR 71 | Temp 98.1°F | Wt 203.0 lb

## 2015-09-07 DIAGNOSIS — Z636 Dependent relative needing care at home: Secondary | ICD-10-CM

## 2015-09-07 DIAGNOSIS — F329 Major depressive disorder, single episode, unspecified: Secondary | ICD-10-CM

## 2015-09-07 DIAGNOSIS — F418 Other specified anxiety disorders: Secondary | ICD-10-CM

## 2015-09-07 DIAGNOSIS — K921 Melena: Secondary | ICD-10-CM | POA: Diagnosis not present

## 2015-09-07 DIAGNOSIS — F419 Anxiety disorder, unspecified: Principal | ICD-10-CM

## 2015-09-07 MED ORDER — SERTRALINE HCL 50 MG PO TABS: ORAL_TABLET | ORAL | Status: DC

## 2015-09-07 MED ORDER — HYDROCORTISONE ACETATE 25 MG RE SUPP: 25.0000 mg | Freq: Two times a day (BID) | RECTAL | Status: DC

## 2015-09-07 NOTE — Progress Notes (Signed)
Pre visit review using our clinic review tool, if applicable. No additional management support is needed unless otherwise documented below in the visit note. 

## 2015-09-07 NOTE — Progress Notes (Signed)
Subjective:    Patient ID: [name], female    DOB: 05-10-1960, 55 y.o.   MRN: [id number]  HPI  Pt presents to the clinic today to discuss her nerves. She reports her father has been diagnosed with cancer, and he also suffers from dementia. 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 She has noticed feelings of uneasiness, excessive worry, fatigue, restlessness, loss of appetite, irritable and having trouble sleeping. She has also noticed when she gets very anxious, her heart rate increases. She has never been treated for anxiety or depression in the past.  She also reports she has noticed a small amount of bright red blood in her stool on 2 different occasions over the last month. She has a history of hemorrhoids and would like something to treat them. She has not tried anything OTC. She denies constipation, rectal pain or itching.    Review of Systems      Past Medical History  Diagnosis Date  . Diabetes mellitus   . Labile blood pressure   . High frequency hearing loss   . Fibroids   . Pelvic pain   . Anemia   . Hx of migraine headaches   . Arthritis   . Vaginal itching   . History of abnormal Pap smear     Current Outpatient Prescriptions  Medication Sig Dispense Refill  . cyclobenzaprine (FLEXERIL) 5 MG tablet Take 1 tablet (5 mg total) by mouth 3 (three) times daily as needed for muscle spasms (watch out for sedation). 30 tablet 3  . Diclofenac Sodium 1.5 % SOLN Apply to affected wrist QID 1 Bottle prn  . gabapentin (NEURONTIN) 100 MG capsule TAKE 1 CAPSULE (100 MG TOTAL) BY MOUTH 3 (THREE) TIMES DAILY. 90 capsule 0  . gabapentin (NEURONTIN) 300 MG capsule Take 1-2 pills by mouth every 8 hours 180 capsule 1  . glucose blood (ONE TOUCH ULTRA TEST) test strip Pt checks BS twice daily and as directed. Dx 250.00 100 each 3  . HYDROcodone-acetaminophen (NORCO) 5-325 MG tablet Take 1-2 tablets by mouth every 6 (six) hours as needed for moderate  pain or severe pain (caution of sedation). 60 tablet 0  . loratadine (CLARITIN) 10 MG tablet Take 10 mg by mouth as needed.     . metFORMIN (GLUCOPHAGE) 500 MG tablet TAKE 1 TABLET BY MOUTH TWICE A DAY WITH MEALS 60 tablet 3  . naproxen (NAPROSYN) 500 MG tablet Take one tablet twice daily with food for 5 days then as needed 60 tablet 0  . traMADol (ULTRAM) 50 MG tablet Take 1-2 tablets (50-100 mg total) by mouth 3 (three) times daily as needed for moderate pain or severe pain (caution of sedation). 60 tablet 1  . valACYclovir (VALTREX) 1000 MG tablet Take 1 tablet (1,000 mg total) by mouth 3 (three) times daily. 21 tablet 0   No current facility-administered medications for this visit.    Allergies  Allergen Reactions  . Amoxicillin-Pot Clavulanate     REACTION: Itchy rash    Family History  Problem Relation Age of Onset  . Heart disease Sister   . Arthritis Sister   . Diabetes Maternal Grandmother   . Hypertension Maternal Grandmother     Social History   Social History  . Marital Status: Single    Spouse Name: N/A  . Number of Children: N/A  . Years of Education: N/A   Occupational History  . Not on file.   Social History Main  Topics  . Smoking status: Never Smoker   . Smokeless tobacco: Never Used  . Alcohol Use: Yes     Comment: occasional/rare  . Drug Use: No  . Sexual Activity:    Partners: Male    Birth Control/ Protection: Surgical     Comment: LAVH   Other Topics Concern  . Not on file   Social History Narrative     Constitutional: Denies fever, malaise, fatigue, headache or abrupt weight changes.  Gastrointestinal: Pt reports blood in the stool. Denies abdominal pain, bloating, constipation, diarrhea.  Neurological: Denies dizziness, difficulty with memory, difficulty with speech or problems with balance and coordination.  Psych: Pt reports stress, anxiety and depression. Denies SI/HI.  No other specific complaints in a complete review of systems  (except as listed in HPI above).  Objective:   Physical Exam  BP 140/90 mmHg  Pulse 71  Temp(Src) 98.1 F (36.7 C) (Oral)  Wt 203 lb (92.08 kg)  SpO2 98% Wt Readings from Last 3 Encounters:  09/07/15 203 lb (92.08 kg)  06/29/15 204 lb 8 oz (92.761 kg)  06/01/15 200 lb (90.719 kg)    General: Appears her stated age, obese in NAD.  Abdomen: Soft and nontender. Normal bowel sounds. No distention or masses noted. Rectal: Pt refused. Neurological: Alert and oriented.  Psychiatric: Mood and affect normal. Behavior is normal. Judgment and thought content normal.     BMET    Component Value Date/Time   NA 139 09/04/2013 0459   NA 139 12/14/2012 0821   NA 139 12/01/2008 1304   K 4.1 09/04/2013 0459   K 4.4 12/14/2012 0821   K 3.8 12/01/2008 1304   CL 107 09/04/2013 0459   CL 109 12/14/2012 0821   CL 105 12/01/2008 1304   CO2 23 09/04/2013 0459   CO2 28 12/14/2012 0821   CO2 27 12/01/2008 1304   GLUCOSE 173* 09/04/2013 0459   GLUCOSE 147* 12/14/2012 0821   GLUCOSE 168* 12/01/2008 1304   BUN 12 09/04/2013 0459   BUN 15 12/14/2012 0821   BUN 13 12/01/2008 1304   CREATININE 0.70 09/04/2013 0459   CREATININE 0.7 12/14/2012 0821   CREATININE 0.8 12/01/2008 1304   CALCIUM 9.0 09/04/2013 0459   CALCIUM 9.0 12/14/2012 0821   CALCIUM 9.2 12/01/2008 1304   GFRNONAA >60 09/04/2013 0459   GFRNONAA 114 03/28/2008 0917   GFRAA >60 09/04/2013 0459   GFRAA 138 03/28/2008 0917    Lipid Panel     Component Value Date/Time   CHOL 163 12/14/2012 0821   TRIG 45.0 12/14/2012 0821   HDL 39.10 12/14/2012 0821   CHOLHDL 4 12/14/2012 0821   VLDL 9.0 12/14/2012 0821   LDLCALC 115* 12/14/2012 0821    CBC    Component Value Date/Time   WBC 4.9 01/12/2015 1723   WBC 7.6 09/04/2013 0459   WBC 5.0 12/01/2008 1304   RBC 4.69 01/12/2015 1723   RBC 5.03 09/04/2013 0459   RBC 4.89 12/01/2008 1304   HGB 11.7* 01/12/2015 1723   HGB 13.1 09/04/2013 0459   HGB 13.6 12/01/2008 1304    HCT 36.1 01/12/2015 1723   HCT 41.2 09/04/2013 0459   HCT 41.4 12/01/2008 1304   PLT 160.0 01/12/2015 1723   PLT 152 09/04/2013 0459   PLT 149 12/01/2008 1304   MCV 77.0* 01/12/2015 1723   MCV 82 09/04/2013 0459   MCV 85 12/01/2008 1304   MCH 26.1 09/04/2013 0459   MCH 27.9 12/01/2008 1304   MCHC  32.3 01/12/2015 1723   MCHC 31.9* 09/04/2013 0459   MCHC 33.0 12/01/2008 1304   RDW 16.8* 01/12/2015 1723   RDW 15.1* 09/04/2013 0459   RDW 12.5 12/01/2008 1304   LYMPHSABS 2.2 01/12/2015 1723   LYMPHSABS 0.4* 09/04/2013 0459   LYMPHSABS 1.7 12/01/2008 1304   MONOABS 0.5 01/12/2015 1723   MONOABS 0.5 09/04/2013 0459   EOSABS 0.1 01/12/2015 1723   EOSABS 0.0 09/04/2013 0459   EOSABS 0.1 12/01/2008 1304   BASOSABS 0.0 01/12/2015 1723   BASOSABS 0.0 09/04/2013 0459   BASOSABS 0.0 12/01/2008 1304    Hgb A1C Lab Results  Component Value Date   HGBA1C 7.4* 12/14/2012         Assessment & Plan:   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

## 2015-09-07 NOTE — Patient Instructions (Signed)
Stress and Stress Management Stress is a normal reaction to life events. It is what you feel when life demands more than you are used to or more than you can handle. Some stress can be useful. For example, the stress reaction can help you catch the last bus of the day, study for a test, or meet a deadline at work. But stress that occurs too often or for too long can cause problems. It can affect your emotional health and interfere with relationships and normal daily activities. Too much stress can weaken your immune system and increase your risk for physical illness. If you already have a medical problem, stress can make it worse. CAUSES  All sorts of life events may cause stress. An event that causes stress for one person may not be stressful for another person. Major life events commonly cause stress. These may be positive or negative. Examples include losing your job, moving into a new home, getting married, having a baby, or losing a loved one. Less obvious life events may also cause stress, especially if they occur day after day or in combination. Examples include working long hours, driving in traffic, caring for children, being in debt, or being in a difficult relationship. SIGNS AND SYMPTOMS Stress may cause emotional symptoms including, the following:  Anxiety. This is feeling worried, afraid, on edge, overwhelmed, or out of control.  Anger. This is feeling irritated or impatient.  Depression. This is feeling sad, down, helpless, or guilty.  Difficulty focusing, remembering, or making decisions. Stress may cause physical symptoms, including the following:   Aches and pains. These may affect your head, neck, back, stomach, or other areas of your body.  Tight muscles or clenched jaw.  Low energy or trouble sleeping. Stress may cause unhealthy behaviors, including the following:   Eating to feel better (overeating) or skipping meals.  Sleeping too little, too much, or both.  Working  too much or putting off tasks (procrastination).  Smoking, drinking alcohol, or using drugs to feel better. DIAGNOSIS  Stress is diagnosed through an assessment by your health care provider. Your health care provider will ask questions about your symptoms and any stressful life events.Your health care provider will also ask about your medical history and may order blood tests or other tests. Certain medical conditions and medicine can cause physical symptoms similar to stress. Mental illness can cause emotional symptoms and unhealthy behaviors similar to stress. Your health care provider may refer you to a mental health professional for further evaluation.  TREATMENT  Stress management is the recommended treatment for stress.The goals of stress management are reducing stressful life events and coping with stress in healthy ways.  Techniques for reducing stressful life events include the following:  Stress identification. Self-monitor for stress and identify what causes stress for you. These skills may help you to avoid some stressful events.  Time management. Set your priorities, keep a calendar of events, and learn to say "no." These tools can help you avoid making too many commitments. Techniques for coping with stress include the following:  Rethinking the problem. Try to think realistically about stressful events rather than ignoring them or overreacting. Try to find the positives in a stressful situation rather than focusing on the negatives.  Exercise. Physical exercise can release both physical and emotional tension. The key is to find a form of exercise you enjoy and do it regularly.  Relaxation techniques. These relax the body and mind. Examples include yoga, meditation, tai chi, biofeedback, deep  breathing, progressive muscle relaxation, listening to music, being out in nature, journaling, and other hobbies. Again, the key is to find one or more that you enjoy and can do  regularly.  Healthy lifestyle. Eat a balanced diet, get plenty of sleep, and do not smoke. Avoid using alcohol or drugs to relax.  Strong support network. Spend time with family, friends, or other people you enjoy being around.Express your feelings and talk things over with someone you trust. Counseling or talktherapy with a mental health professional may be helpful if you are having difficulty managing stress on your own. Medicine is typically not recommended for the treatment of stress.Talk to your health care provider if you think you need medicine for symptoms of stress. HOME CARE INSTRUCTIONS  Keep all follow-up visits as directed by your health care provider.  Take all medicines as directed by your health care provider. SEEK MEDICAL CARE IF:  Your symptoms get worse or you start having new symptoms.  You feel overwhelmed by your problems and can no longer manage them on your own. SEEK IMMEDIATE MEDICAL CARE IF:  You feel like hurting yourself or someone else.   This information is not intended to replace advice given to you by your health care provider. Make sure you discuss any questions you have with your health care provider.   Document Released: 09/11/2000 Document Revised: 04/08/2014 Document Reviewed: 11/10/2012 Elsevier Interactive Patient Education 2016 [employer]  

## 2015-09-11 ENCOUNTER — Encounter: Payer: Self-pay | Admitting: Internal Medicine

## 2015-09-13 ENCOUNTER — Telehealth: Payer: Self-pay | Admitting: Family Medicine

## 2015-09-13 DIAGNOSIS — Z7689 Persons encountering health services in other specified circumstances: Secondary | ICD-10-CM

## 2015-09-13 NOTE — Telephone Encounter (Signed)
Done, given to Robin 

## 2015-09-13 NOTE — Telephone Encounter (Signed)
Paper work faxed 09/13/15

## 2015-09-13 NOTE — Telephone Encounter (Signed)
Pt aware paperwork has been faxed Copy for pt Copy for file Copy for scan Copy for billing

## 2015-09-13 NOTE — Telephone Encounter (Signed)
FMLA paperwork faxed in from [place] [employer] in box for review and signature

## 2015-09-14 NOTE — Telephone Encounter (Signed)
[employer] faxed additional paperwork to be filled out Pt will come by to sign a medical release form so we can fax 09/07/15 office notes [employer] in box for review and signature

## 2015-09-14 NOTE — Telephone Encounter (Signed)
Done, in my outbox for Shirlean Mylar

## 2015-09-15 NOTE — Telephone Encounter (Signed)
Paperwork faxed to [employer] Pt aware Copy for pt Copy for file Copy for scan

## 2015-09-19 NOTE — Telephone Encounter (Signed)
To my knowledge it was [facility]

## 2015-09-19 NOTE — Telephone Encounter (Signed)
Lattie Haw nurse with [employer] left v/m requesting cb wanting to clarify if pt was out of work due to disability where pt was totally not able to perform work or [employer]; Lattie Haw wants to know if only visit was 09/07/15. Pt was out of work from 09/06/15 and returned to work on 09/18/15; wants to know if this was related to short term disability or FMLA.Lisa request cb.

## 2015-09-20 ENCOUNTER — Telehealth: Payer: Self-pay | Admitting: Family Medicine

## 2015-09-20 NOTE — Telephone Encounter (Signed)
Dropped off fmla paperwork 6/19-6/20 with note was this sent out? On it. i spoke with pt on 6/21 asking if this was the same fmla paperwork that i sent to [facility] on 09/13/15 and 09/15/15 Pt stated she didn't know.  I ask pt to call the [employer] to see if they need additional information.  She is going to call the [employer] and let me know if i need to fill out this additional information or if they have everything thing they need for her fmla

## 2015-09-20 NOTE — Telephone Encounter (Signed)
[employer] called  They spoke to [employer] about fmla for ms Yoon

## 2015-10-01 ENCOUNTER — Telehealth: Payer: Self-pay | Admitting: Family Medicine

## 2015-10-01 NOTE — Telephone Encounter (Signed)
-----   Message from Ellamae Sia sent at 09/27/2015  4:30 PM EDT ----- Regarding: Lab orders for Monday, 7.3.17 Lab orders for f/u appt

## 2015-10-01 NOTE — Telephone Encounter (Signed)
It looks like she cancelled her lab appt and f/u

## 2015-10-02 ENCOUNTER — Other Ambulatory Visit: Payer: Managed Care, Other (non HMO)

## 2015-10-04 ENCOUNTER — Ambulatory Visit: Payer: Managed Care, Other (non HMO) | Admitting: Family Medicine

## 2015-12-01 LAB — HM DIABETES EYE EXAM

## 2015-12-19 ENCOUNTER — Telehealth: Payer: Self-pay | Admitting: Family Medicine

## 2015-12-19 NOTE — Telephone Encounter (Signed)
Pt spoke with carrie and stated she would call back to schedule

## 2015-12-19 NOTE — Telephone Encounter (Signed)
Left message asking pt to call office Pt needs to schedule office visit before 9/30 per list

## 2016-04-13 ENCOUNTER — Other Ambulatory Visit: Payer: Self-pay | Admitting: Family Medicine

## 2016-04-13 NOTE — Telephone Encounter (Signed)
Will refill electronically  

## 2016-04-13 NOTE — Telephone Encounter (Signed)
Last office visit 09/07/2015 with R. Baity. Last refilled 02/11/2015 for #30 with 3 refills.  Ok to refill?

## 2016-08-30 ENCOUNTER — Encounter: Payer: Self-pay | Admitting: Family Medicine

## 2016-08-30 ENCOUNTER — Ambulatory Visit (INDEPENDENT_AMBULATORY_CARE_PROVIDER_SITE_OTHER): Payer: Commercial Managed Care - PPO | Admitting: Family Medicine

## 2016-08-30 VITALS — BP 136/86 | HR 60 | Temp 98.2°F | Ht 63.0 in | Wt 197.5 lb

## 2016-08-30 DIAGNOSIS — Z794 Long term (current) use of insulin: Secondary | ICD-10-CM | POA: Diagnosis not present

## 2016-08-30 DIAGNOSIS — Z0000 Encounter for general adult medical examination without abnormal findings: Secondary | ICD-10-CM | POA: Insufficient documentation

## 2016-08-30 DIAGNOSIS — Z1231 Encounter for screening mammogram for malignant neoplasm of breast: Secondary | ICD-10-CM

## 2016-08-30 DIAGNOSIS — D518 Other vitamin B12 deficiency anemias: Secondary | ICD-10-CM | POA: Diagnosis not present

## 2016-08-30 DIAGNOSIS — E785 Hyperlipidemia, unspecified: Secondary | ICD-10-CM | POA: Diagnosis not present

## 2016-08-30 DIAGNOSIS — R350 Frequency of micturition: Secondary | ICD-10-CM | POA: Diagnosis not present

## 2016-08-30 DIAGNOSIS — Z114 Encounter for screening for human immunodeficiency virus [HIV]: Secondary | ICD-10-CM

## 2016-08-30 DIAGNOSIS — E119 Type 2 diabetes mellitus without complications: Secondary | ICD-10-CM | POA: Diagnosis not present

## 2016-08-30 DIAGNOSIS — Z6834 Body mass index (BMI) 34.0-34.9, adult: Secondary | ICD-10-CM

## 2016-08-30 DIAGNOSIS — Z1159 Encounter for screening for other viral diseases: Secondary | ICD-10-CM | POA: Insufficient documentation

## 2016-08-30 DIAGNOSIS — E6609 Other obesity due to excess calories: Secondary | ICD-10-CM

## 2016-08-30 LAB — POC URINALSYSI DIPSTICK (AUTOMATED)
Bilirubin, UA: NEGATIVE
GLUCOSE UA: NEGATIVE
Ketones, UA: NEGATIVE
Leukocytes, UA: NEGATIVE
Nitrite, UA: NEGATIVE
Protein, UA: NEGATIVE
RBC UA: NEGATIVE
UROBILINOGEN UA: 0.2 U/dL
pH, UA: 6 (ref 5.0–8.0)

## 2016-08-30 LAB — MICROALBUMIN / CREATININE URINE RATIO
CREATININE, U: 174.7 mg/dL
MICROALB/CREAT RATIO: 1.2 mg/g (ref 0.0–30.0)
Microalb, Ur: 2.1 mg/dL — ABNORMAL HIGH (ref 0.0–1.9)

## 2016-08-30 LAB — HEMOGLOBIN A1C: Hgb A1c MFr Bld: 9 % — ABNORMAL HIGH (ref 4.6–6.5)

## 2016-08-30 LAB — LIPID PANEL
CHOL/HDL RATIO: 4
Cholesterol: 173 mg/dL (ref 0–200)
HDL: 41.6 mg/dL (ref >39.00)
LDL CALC: 121 mg/dL — AB (ref 0–99)
NONHDL: 131.84
Triglycerides: 56 mg/dL (ref 0.0–149.0)
VLDL: 11.2 mg/dL (ref 0.0–40.0)

## 2016-08-30 LAB — CBC WITH DIFFERENTIAL/PLATELET
Basophils Absolute: 0 10*3/uL (ref 0.0–0.1)
Basophils Relative: 0.6 % (ref 0.0–3.0)
EOS ABS: 0.1 10*3/uL (ref 0.0–0.7)
Eosinophils Relative: 1.7 % (ref 0.0–5.0)
HCT: 39.1 % (ref 36.0–46.0)
HEMOGLOBIN: 12.5 g/dL (ref 12.0–15.0)
Lymphocytes Relative: 41.2 % (ref 12.0–46.0)
Lymphs Abs: 1.5 10*3/uL (ref 0.7–4.0)
MCHC: 31.9 g/dL (ref 30.0–36.0)
MCV: 80.7 fl (ref 78.0–100.0)
MONO ABS: 0.4 10*3/uL (ref 0.1–1.0)
Monocytes Relative: 10.5 % (ref 3.0–12.0)
Neutro Abs: 1.6 10*3/uL (ref 1.4–7.7)
Neutrophils Relative %: 46 % (ref 43.0–77.0)
Platelets: 143 10*3/uL — ABNORMAL LOW (ref 150.0–400.0)
RBC: 4.85 Mil/uL (ref 3.87–5.11)
RDW: 15.5 % (ref 11.5–15.5)
WBC: 3.6 10*3/uL — AB (ref 4.0–10.5)

## 2016-08-30 LAB — COMPREHENSIVE METABOLIC PANEL
ALT: 14 U/L (ref 0–35)
AST: 16 U/L (ref 0–37)
Albumin: 4.4 g/dL (ref 3.5–5.2)
Alkaline Phosphatase: 78 U/L (ref 39–117)
BUN: 15 mg/dL (ref 6–23)
CHLORIDE: 107 meq/L (ref 96–112)
CO2: 29 meq/L (ref 19–32)
Calcium: 9.5 mg/dL (ref 8.4–10.5)
Creatinine, Ser: 0.75 mg/dL (ref 0.40–1.20)
GFR: 102.92 mL/min (ref >60.00)
Glucose, Bld: 160 mg/dL — ABNORMAL HIGH (ref 70–99)
Potassium: 4.7 mEq/L (ref 3.5–5.1)
SODIUM: 142 meq/L (ref 135–145)
Total Bilirubin: 0.3 mg/dL (ref 0.2–1.2)
Total Protein: 7.3 g/dL (ref 6.0–8.3)

## 2016-08-30 LAB — VITAMIN B12: Vitamin B-12: 156 pg/mL — ABNORMAL LOW (ref 211–911)

## 2016-08-30 LAB — TSH: TSH: 0.94 u[IU]/mL (ref 0.35–4.50)

## 2016-08-30 MED ORDER — GABAPENTIN 300 MG PO CAPS: ORAL_CAPSULE | ORAL | 11 refills | Status: DC

## 2016-08-30 MED ORDER — METFORMIN HCL 500 MG PO TABS
500.0000 mg | ORAL_TABLET | Freq: Two times a day (BID) | ORAL | 11 refills | Status: DC
Start: 2016-08-30 — End: 2016-11-21

## 2016-08-30 MED ORDER — CYCLOBENZAPRINE HCL 5 MG PO TABS: ORAL_TABLET | ORAL | 5 refills | Status: DC

## 2016-08-30 NOTE — Patient Instructions (Addendum)
Get some omeprazole (prilosec) over the counter one daily  Let's work on a GI referral at a later date   We will refer you for a mammogram at check out   Watch out for shellfish - very high in cholesterol Avoid red meat/ fried foods/ egg yolks/ fatty breakfast meats/ butter, cheese and high fat dairy/ and shellfish    Labs today for wellness and hiv/ hepatitis C screen and urine protein test and B12 level and diabetes testing   Get back on track for self care  Start exercise 30 min per day  Eat a healthy balanced diet  Take time for yourself  Get outdoors   Follow up in 3 months

## 2016-08-30 NOTE — Progress Notes (Signed)
Subjective:    Patient ID: [name], female    DOB: [birth year], 56 y.o.   MRN: [id number]  HPI Here for health maintenance exam and to review chronic medical problems    She has had a lot going on this year  Had shingles  Her father passed - had to take care of him (unable to come for doctor visits)= alz and cancer (lung and colon)   Digestive system is "messed up" "I can't hardly eat"  Stomach hurts / cramps with everything she eats  No hx of gerd that she knows of  She has taken acid blocker/zantac and alka selzer   Wt Readings from Last 3 Encounters:  08/30/16 197 lb 8 oz (89.6 kg)  09/07/15 203 lb (92.1 kg)  06/29/15 204 lb 8 oz (92.8 kg)  wt loss- she cannot eat much due to stomach problems / she also eats fairly healthy  Not a lot of time to exercise -has a treadmill and elliptical - will be able to care for herself hopefully soon  bmi 34.9  Hep C/HIV screen -wants screen    Has had a hysterectomy for fibroids and bleeding  Not seeing gyn  No gyn symptoms  She urinates frequently   Mammogram 7/13- wants to schedule  No lumps on self exam  Wants to get back on track - [place] exam 9/17 - was ok   Flu shot - she got one last season   Colonoscopy 2/14-nl / 5 y recall   Tetanus shot 9/14    Hx of DM2 Lab Results  Component Value Date   HGBA1C 7.4 (H) 12/14/2012  has not had one since then  Glucose is 130-150 in the am  90s later in the day  Uses a kit from [employer]  Is urinating frequently  Not very thirsty- but drinks lots of water because she is hot   Hx of hyperlipidemia Lab Results  Component Value Date   CHOL 163 12/14/2012   HDL 39.10 12/14/2012   LDLCALC 115 (H) 12/14/2012   TRIG 45.0 12/14/2012   CHOLHDL 4 12/14/2012  had a reading at a health fair - states it was normal  Avoids fatty foods /junk  Eats a lot of shrimp however   Colonoscopy 2/14- normal  5 year recall   BP Readings from Last 3 Encounters:  08/30/16 136/86   09/07/15 140/90  06/29/15 124/76    Patient Active Problem List   Diagnosis Date Noted  . Screening mammogram, encounter for 08/30/2016  . Routine general medical examination at a health care facility 08/30/2016  . Encounter for screening for HIV 08/30/2016  . Need for hepatitis C screening test 08/30/2016  . History of herpes zoster 05/12/2015  . Hyperlipidemia with target low density lipoprotein (LDL) cholesterol less than 100 mg/dL 12/21/2012  . Obesity 12/21/2012  . Vaginal atrophy 12/18/2012  . Dyspepsia 08/20/2012  . Rectal bleeding 12/04/2011  . Lower back pain 12/04/2011  . ELEVATED BLOOD PRESSURE 06/04/2010  . Diseases of lips 04/10/2010  . ANEMIA, B12 DEFICIENCY 10/05/2008  . THROMBOCYTOPENIA 10/05/2008  . Allergic rhinitis 10/05/2008  . Headache(784.0) 10/05/2008  . FREQUENCY, URINARY 10/05/2008  . Type 2 diabetes mellitus without complications ([place]) 35/00/9381  . ANEMIA, IRON DEFICIENCY 01/26/2008   Past Medical History:  Diagnosis Date  . Anemia   . Arthritis   . Diabetes mellitus   . Fibroids   . High frequency hearing loss   . History of abnormal Pap  smear   . Hx of migraine headaches   . Labile blood pressure   . Pelvic pain   . Vaginal itching    Past Surgical History:  Procedure Laterality Date  . BUNIONECTOMY    . COLONOSCOPY  03/2007   neg  . FLEXIBLE SIGMOIDOSCOPY  2000  . LAPAROSCOPIC ASSISTED VAGINAL HYSTERECTOMY  08/2005   Fibroids, menorrhagia.  Benign pathology  . LEEP    . NASAL ENDOSCOPY  03/2007   neg   Social History  Substance Use Topics  . Smoking status: Never Smoker  . Smokeless tobacco: Never Used  . Alcohol use Yes     Comment: occasional/rare   Family History  Problem Relation Age of Onset  . Heart disease Sister   . Arthritis Sister   . Diabetes Maternal Grandmother   . Hypertension Maternal Grandmother   . Lung cancer Father   . Colon cancer Father    Allergies  Allergen Reactions  . Amoxicillin-Pot  Clavulanate     REACTION: Itchy rash   Current Outpatient Prescriptions on File Prior to Visit  Medication Sig Dispense Refill  . Diclofenac Sodium 1.5 % SOLN Apply to affected wrist QID 1 Bottle prn  . glucose blood (ONE TOUCH ULTRA TEST) test strip Pt checks BS twice daily and as directed. Dx 250.00 100 each 3  . loratadine (CLARITIN) 10 MG tablet Take 10 mg by mouth as needed.     . naproxen (NAPROSYN) 500 MG tablet Take one tablet twice daily with food for 5 days then as needed 60 tablet 0  . traMADol (ULTRAM) 50 MG tablet Take 1-2 tablets (50-100 mg total) by mouth 3 (three) times daily as needed for moderate pain or severe pain (caution of sedation). 60 tablet 1   No current facility-administered medications on file prior to visit.     Review of Systems Review of Systems  Constitutional: Negative for fever, appetite change, and unexpected weight change.  Eyes: Negative for pain and visual disturbance.  Respiratory: Negative for cough and shortness of breath.   Cardiovascular: Negative for cp or palpitations    Gastrointestinal: Negative for nausea, diarrhea and constipation. pos for symptoms of dyspepsia and early satiety and low abd pain  Genitourinary: Negative for urgency and pos for frequency.  Skin: Negative for pallor or rash   Neurological: Negative for weakness, light-headedness, numbness and headaches.  Hematological: Negative for adenopathy. Does not bruise/bleed easily.  Psychiatric/Behavioral: Negative for dysphoric mood. The patient is not nervous/anxious.         Objective:   Physical Exam  Constitutional: She appears well-developed and well-nourished. No distress.  obese and well appearing   HENT:  Head: Normocephalic and atraumatic.  Right Ear: External ear normal.  Left Ear: External ear normal.  Mouth/Throat: Oropharynx is clear and moist.  Eyes: Conjunctivae and EOM are normal. Pupils are equal, round, and reactive to light. No scleral icterus.  Neck:  Normal range of motion. Neck supple. No JVD present. Carotid bruit is not present. No thyromegaly present.  Cardiovascular: Normal rate, regular rhythm, normal heart sounds and intact distal pulses.  Exam reveals no gallop.   Pulmonary/Chest: Effort normal and breath sounds normal. No respiratory distress. She has no wheezes. She exhibits no tenderness.  Abdominal: Soft. Bowel sounds are normal. She exhibits no distension, no abdominal bruit and no mass. There is tenderness.  Tender in epigastrium and bilat LQ- mild No rebound or guarding   Genitourinary: No breast swelling, tenderness, discharge or bleeding.  Musculoskeletal: Normal range of motion. She exhibits no edema or tenderness.  Lymphadenopathy:    She has no cervical adenopathy.  Neurological: She is alert. She has normal reflexes. No cranial nerve deficit. She exhibits normal muscle tone. Coordination normal.  Skin: Skin is warm and dry. No rash noted. No erythema. No pallor.  Psychiatric: She has a normal mood and affect.  Pt seems fatigued          Assessment & Plan:   Problem List Items Addressed This Visit      Endocrine   Type 2 diabetes mellitus without complications ([place]) - Primary    Pt has been lost to f/u  A1C and microalb today Enc wt loss/ DM diet/exercise       Relevant Medications   metFORMIN (GLUCOPHAGE) 500 MG tablet   Other Relevant Orders   Microalbumin / creatinine urine ratio (Completed)   Hemoglobin A1c (Completed)     Other   ANEMIA, B12 DEFICIENCY    Lab today for B12  Plan from there      Relevant Orders   Vitamin B12 (Completed)   Encounter for screening for HIV    HIV screen today      Relevant Orders   HIV antibody (with reflex) (Completed)   FREQUENCY, URINARY    UA neg except for being concentrated Enc 64 oz fluids daily/mostly water  May have early overactive bladder      Relevant Orders   POCT Urinalysis Dipstick (Automated) (Completed)   Hyperlipidemia with target  low density lipoprotein (LDL) cholesterol less than 100 mg/dL    Disc goals for lipids and reasons to control them Rev labs with pt (last draw) Rev low sat fat diet in detail  Has been lost to f/u Disc cutting back on shellfish      Need for hepatitis C screening test    Hep C screen today      Relevant Orders   Hepatitis C antibody (Completed)   Obesity    Discussed how this problem influences overall health and the risks it imposes  Reviewed plan for weight loss with lower calorie diet (via better food choices and also portion control or program like weight watchers) and exercise building up to or more than 30 minutes 5 days per week including some aerobic activity         Relevant Medications   metFORMIN (GLUCOPHAGE) 500 MG tablet   Routine general medical examination at a health care facility    Reviewed health habits including diet and exercise and skin cancer prevention Reviewed appropriate screening tests for age  Also reviewed health mt list, fam hx and immunization status , as well as social and family history   See HPI Has been lost to f/u for a while  Enc better self care/diet/exercise Labs today  Referred for mammogram Disc Shingrix vaccine        Relevant Orders   CBC with Differential/Platelet (Completed)   Comprehensive metabolic panel (Completed)   Lipid panel (Completed)   TSH (Completed)   POCT Urinalysis Dipstick (Automated) (Completed)   Screening mammogram, encounter for    Scheduled annual screening mammogram Nl breast exam today  Encouraged monthly self exams        Relevant Orders   MM DIGITAL SCREENING BILATERAL

## 2016-08-31 LAB — HEPATITIS C ANTIBODY: HCV AB: NEGATIVE

## 2016-08-31 LAB — HIV ANTIBODY (ROUTINE TESTING W REFLEX): HIV: NONREACTIVE

## 2016-09-01 NOTE — Assessment & Plan Note (Signed)
Disc goals for lipids and reasons to control them Rev labs with pt (last draw) Rev low sat fat diet in detail  Has been lost to f/u Disc cutting back on shellfish

## 2016-09-01 NOTE — Assessment & Plan Note (Signed)
UA neg except for being concentrated Enc 64 oz fluids daily/mostly water  May have early overactive bladder

## 2016-09-01 NOTE — Assessment & Plan Note (Signed)
- 

## 2016-09-01 NOTE — Assessment & Plan Note (Signed)
Lab today for B12  Plan from there

## 2016-09-01 NOTE — Assessment & Plan Note (Signed)
Hep C screen today 

## 2016-09-01 NOTE — Assessment & Plan Note (Signed)
Reviewed health habits including diet and exercise and skin cancer prevention Reviewed appropriate screening tests for age  Also reviewed health mt list, fam hx and immunization status , as well as social and family history   See HPI Has been lost to f/u for a while  Enc better self care/diet/exercise Labs today  Referred for mammogram Disc Shingrix vaccine

## 2016-09-01 NOTE — Assessment & Plan Note (Signed)
Pt has been lost to f/u  A1C and microalb today Enc wt loss/ DM diet/exercise

## 2016-09-01 NOTE — Assessment & Plan Note (Signed)
Scheduled annual screening mammogram Nl breast exam today  Encouraged monthly self exams   

## 2016-09-01 NOTE — Assessment & Plan Note (Signed)
Discussed how this problem influences overall health and the risks it imposes  Reviewed plan for weight loss with lower calorie diet (via better food choices and also portion control or program like weight watchers) and exercise building up to or more than 30 minutes 5 days per week including some aerobic activity    

## 2016-09-02 ENCOUNTER — Telehealth: Payer: Self-pay | Admitting: *Deleted

## 2016-09-02 DIAGNOSIS — R1013 Epigastric pain: Secondary | ICD-10-CM

## 2016-09-02 NOTE — Telephone Encounter (Signed)
Ref done  Will route to [facility] 

## 2016-09-02 NOTE — Telephone Encounter (Signed)
I called to advise pt of her labs results (that's taken care of in result notes), pt wanted me to ask Dr. Glori Bickers if she put the referral in to see GI, pt said she would like to go to GI in [place] if possible, ([facility] is fine), please put referral in if appropriate and I advise pt our [facility] will call to schedule appt

## 2016-09-05 ENCOUNTER — Encounter: Payer: Self-pay | Admitting: Internal Medicine

## 2016-09-18 ENCOUNTER — Ambulatory Visit: Payer: Commercial Managed Care - PPO | Admitting: Family Medicine

## 2016-09-19 ENCOUNTER — Ambulatory Visit: Payer: Commercial Managed Care - PPO | Admitting: Nurse Practitioner

## 2016-09-30 ENCOUNTER — Ambulatory Visit
Admission: RE | Admit: 2016-09-30 | Discharge: 2016-09-30 | Disposition: A | Discharge status: Home / Self Care | Payer: Commercial Managed Care - PPO | Source: Ambulatory Visit | Attending: Family Medicine | Admitting: Family Medicine

## 2016-09-30 DIAGNOSIS — Z1231 Encounter for screening mammogram for malignant neoplasm of breast: Secondary | ICD-10-CM

## 2016-10-11 ENCOUNTER — Encounter: Payer: Self-pay | Admitting: Family Medicine

## 2016-10-11 ENCOUNTER — Ambulatory Visit: Payer: Commercial Managed Care - PPO | Admitting: Internal Medicine

## 2016-10-11 ENCOUNTER — Ambulatory Visit (INDEPENDENT_AMBULATORY_CARE_PROVIDER_SITE_OTHER): Payer: Commercial Managed Care - PPO | Admitting: Family Medicine

## 2016-10-11 VITALS — BP 130/74 | HR 78 | Temp 98.3°F | Ht 63.0 in | Wt 193.2 lb

## 2016-10-11 DIAGNOSIS — D696 Thrombocytopenia, unspecified: Secondary | ICD-10-CM | POA: Diagnosis not present

## 2016-10-11 DIAGNOSIS — J0110 Acute frontal sinusitis, unspecified: Secondary | ICD-10-CM

## 2016-10-11 DIAGNOSIS — J019 Acute sinusitis, unspecified: Secondary | ICD-10-CM | POA: Insufficient documentation

## 2016-10-11 DIAGNOSIS — E119 Type 2 diabetes mellitus without complications: Secondary | ICD-10-CM

## 2016-10-11 DIAGNOSIS — I10 Essential (primary) hypertension: Secondary | ICD-10-CM

## 2016-10-11 DIAGNOSIS — E6609 Other obesity due to excess calories: Secondary | ICD-10-CM

## 2016-10-11 DIAGNOSIS — Z794 Long term (current) use of insulin: Secondary | ICD-10-CM

## 2016-10-11 DIAGNOSIS — D518 Other vitamin B12 deficiency anemias: Secondary | ICD-10-CM

## 2016-10-11 DIAGNOSIS — R51 Headache: Secondary | ICD-10-CM

## 2016-10-11 DIAGNOSIS — Z6834 Body mass index (BMI) 34.0-34.9, adult: Secondary | ICD-10-CM

## 2016-10-11 DIAGNOSIS — E785 Hyperlipidemia, unspecified: Secondary | ICD-10-CM | POA: Diagnosis not present

## 2016-10-11 DIAGNOSIS — R519 Headache, unspecified: Secondary | ICD-10-CM

## 2016-10-11 MED ORDER — AZITHROMYCIN 250 MG PO TABS: ORAL_TABLET | ORAL | 0 refills | Status: DC

## 2016-10-11 MED ORDER — CYANOCOBALAMIN 1000 MCG/ML IJ SOLN
1000.0000 ug | Freq: Once | INTRAMUSCULAR | Status: AC
Start: 2016-10-11 — End: 2016-10-11
  Administered 2016-10-11: 1000 ug via INTRAMUSCULAR

## 2016-10-11 MED ORDER — LISINOPRIL 10 MG PO TABS: 10.0000 mg | ORAL_TABLET | Freq: Every day | ORAL | 11 refills | Status: DC

## 2016-10-11 NOTE — Patient Instructions (Addendum)
Start lisinopril for blood pressure and kidney health  If any side effects please let me know   For allergies - get zyrtec 10 mg and take daily at night time (helps itching too)  It is better than claritin   B12 shot today  Get B12 supplement over the counter 1000 mcg and take one pill daily with food  (liquid is also fine)    For sinus infection-take the zithromax as directed  Nasal saline spray or irrigation helps  Over the counter   Follow up with lab prior in sept   Good job with all the lifestyle change

## 2016-10-11 NOTE — Assessment & Plan Note (Signed)
Better diet Disc goals for lipids and reasons to control them Rev labs with pt Rev low sat fat diet in detail Expect imp at sept visit for re check  Goal is LDL under 70 ultimately  Statin if not improving [name]

## 2016-10-11 NOTE — Assessment & Plan Note (Signed)
Lab Results  Component Value Date   HGBA1C 9.0 (H) 08/30/2016   Since this time with metformin and diet home glucose levels are improved! Also loosing weight and better exercise and self care in general  Re check in September Start lisinopril for bp and also bp control  Enc to keep up the good work

## 2016-10-11 NOTE — Progress Notes (Signed)
Subjective:    Patient ID: [name], female    DOB: Feb 25, 1961, 56 y.o.   MRN: [id number]  HPI  Here for f/u of chronic medical problems  Wt Readings from Last 3 Encounters:  10/11/16 193 lb 4 oz (87.7 kg)  08/30/16 197 lb 8 oz (89.6 kg)  09/07/15 203 lb (92.1 kg)  down 10 lb  Feels better! Started walking on her treadmill  34.23 kg/m  Last visit DM (lost to f/u) was worse Lab Results  Component Value Date   HGBA1C 9.0 (H) 08/30/2016  (this was up from 7.4) Home sugar results   Am sugar is mostly below 130 now, and pm below 150-160  DM diet - much better  Exercise - walking  Symptoms-none  A1C last  Lab Results  Component Value Date   HGBA1C 9.0 (H) 08/30/2016    No problems with medications -metformin 500 bid  Renal protection   Lab Results  Component Value Date   MICROALBUR 2.1 (H) 08/30/2016    Last eye exam  9/17   BP Readings from Last 3 Encounters:  10/11/16 130/74  08/30/16 136/86  09/07/15 140/90  she is checking at home open to startgin ace     B12 was low  Lab Results  Component Value Date   VITAMINB12 156 (L) 08/30/2016    Had GI issues - was scheduled to see GI Was seen in ED outside of [facility] for abd cramping  She felt better and cancelled her GI appt  Feeling better - eating better - cut out spicy/ starchy foods and chips and junk   Hyperlipidemia Lab Results  Component Value Date   CHOL 173 08/30/2016   HDL 41.60 08/30/2016   LDLCALC 121 (H) 08/30/2016   TRIG 56.0 08/30/2016   CHOLHDL 4 08/30/2016   Still having some headaches  Drinks 6 (16 oz ) bottles of water per day  D/c all her sodas / for over 1 month  For headaches she is not taking anything  Constant/low level Worse since stopping caffeine  Good habits Frontal/ thinks it is sinus related Also the back of her neck  Stress level is better  Has post nasal drip  Spits up some yellow bloody stuff   (? Sinus infx)    Hx of mildly low platelets Lab  Results  Component Value Date   WBC 3.6 (L) 08/30/2016   HGB 12.5 08/30/2016   HCT 39.1 08/30/2016   MCV 80.7 08/30/2016   PLT 143.0 (L) 08/30/2016   fairly baseline No bruising or bleeding    Patient Active Problem List   Diagnosis Date Noted  . Essential hypertension 10/11/2016  . Acute sinusitis 10/11/2016  . Screening mammogram, encounter for 08/30/2016  . Routine general medical examination at a health care facility 08/30/2016  . Encounter for screening for HIV 08/30/2016  . Need for hepatitis C screening test 08/30/2016  . History of herpes zoster 05/12/2015  . Hyperlipidemia with target low density lipoprotein (LDL) cholesterol less than 100 mg/dL 12/21/2012  . Obesity 12/21/2012  . Vaginal atrophy 12/18/2012  . Dyspepsia 08/20/2012  . Rectal bleeding 12/04/2011  . Lower back pain 12/04/2011  . ELEVATED BLOOD PRESSURE 06/04/2010  . Diseases of lips 04/10/2010  . ANEMIA, B12 DEFICIENCY 10/05/2008  . THROMBOCYTOPENIA 10/05/2008  . Allergic rhinitis 10/05/2008  . Chronic headache 10/05/2008  . FREQUENCY, URINARY 10/05/2008  . Type 2 diabetes mellitus without complications ([place]) 60/63/0160  . ANEMIA, IRON DEFICIENCY 01/26/2008   Past  Medical History:  Diagnosis Date  . Anemia   . Arthritis   . Diabetes mellitus   . Fibroids   . High frequency hearing loss   . History of abnormal Pap smear   . Hx of migraine headaches   . Labile blood pressure   . Pelvic pain   . Vaginal itching    Past Surgical History:  Procedure Laterality Date  . BUNIONECTOMY    . COLONOSCOPY  03/2007   neg  . FLEXIBLE SIGMOIDOSCOPY  2000  . LAPAROSCOPIC ASSISTED VAGINAL HYSTERECTOMY  08/2005   Fibroids, menorrhagia.  Benign pathology  . LEEP    . NASAL ENDOSCOPY  03/2007   neg   Social History  Substance Use Topics  . Smoking status: Never Smoker  . Smokeless tobacco: Never Used  . Alcohol use Yes     Comment: occasional/rare   Family History  Problem Relation Age of Onset    . Heart disease Sister   . Arthritis Sister   . Diabetes Maternal Grandmother   . Hypertension Maternal Grandmother   . Lung cancer Father   . Colon cancer Father   . Breast cancer Neg Hx    Allergies  Allergen Reactions  . Amoxicillin-Pot Clavulanate     REACTION: Itchy rash   Current Outpatient Prescriptions on File Prior to Visit  Medication Sig Dispense Refill  . cyclobenzaprine (FLEXERIL) 5 MG tablet TAKE 1 TABLET BY MOUTH 3 TIMES A DAY AS NEEDED FOR MUSCLE SPASMS (WATCH OUT FOR SEDATION) 30 tablet 5  . Diclofenac Sodium 1.5 % SOLN Apply to affected wrist QID 1 Bottle prn  . gabapentin (NEURONTIN) 300 MG capsule Take 1-2 pills by mouth every 8 hours 180 capsule 11  . glucose blood (ONE TOUCH ULTRA TEST) test strip Pt checks BS twice daily and as directed. Dx 250.00 100 each 3  . loratadine (CLARITIN) 10 MG tablet Take 10 mg by mouth as needed.     . metFORMIN (GLUCOPHAGE) 500 MG tablet Take 1 tablet (500 mg total) by mouth 2 (two) times daily with a meal. 60 tablet 11  . naproxen (NAPROSYN) 500 MG tablet Take one tablet twice daily with food for 5 days then as needed 60 tablet 0  . traMADol (ULTRAM) 50 MG tablet Take 1-2 tablets (50-100 mg total) by mouth 3 (three) times daily as needed for moderate pain or severe pain (caution of sedation). 60 tablet 1   No current facility-administered medications on file prior to visit.     Review of Systems  Constitutional: Negative for fever, appetite change, fatigue and unexpected weight change.  Eyes: Negative for pain and visual disturbance.  ent pos for sinus pain  Respiratory: Negative for cough and shortness of breath.   Cardiovascular: Negative for cp or palpitations    Gastrointestinal: Negative for nausea, diarrhea and constipation.  Genitourinary: Negative for urgency and frequency.  Skin: Negative for pallor or rash  pos for itching w/o rash  Neurological: Negative for weakness, light-headedness, numbness and headaches.   Hematological: Negative for adenopathy. Does not bruise/bleed easily.  Psychiatric/Behavioral: Negative for dysphoric mood. The patient is not nervous/anxious.         Objective:   Physical Exam  Constitutional: She appears well-developed and well-nourished. No distress.  obese and well appearing   HENT:  Head: Normocephalic and atraumatic.  Right Ear: External ear normal.  Left Ear: External ear normal.  Nose: Nose normal.  Mouth/Throat: Oropharynx is clear and moist. No oropharyngeal exudate.  Nares  are injected and congested  Bilateral maxillary sinus tenderness  Post nasal drip   Eyes: Pupils are equal, round, and reactive to light. Conjunctivae and EOM are normal. Right eye exhibits no discharge. Left eye exhibits no discharge. No scleral icterus.  Neck: Normal range of motion. Neck supple. No JVD present. Carotid bruit is not present. No thyromegaly present.  Cardiovascular: Normal rate, regular rhythm, normal heart sounds and intact distal pulses.  Exam reveals no gallop.   Pulmonary/Chest: Effort normal and breath sounds normal. No respiratory distress. She has no wheezes. She has no rales.  Abdominal: Soft. Bowel sounds are normal. She exhibits no distension and no mass. There is no tenderness.  Musculoskeletal: She exhibits no edema or tenderness.  Lymphadenopathy:    She has no cervical adenopathy.  Neurological: She is alert. She has normal reflexes. No cranial nerve deficit. She exhibits normal muscle tone. Coordination normal.  Skin: Skin is warm and dry. No rash noted. No erythema. No pallor.  Psychiatric: She has a normal mood and affect.          Assessment & Plan:   Problem List Items Addressed This Visit      Cardiovascular and Mediastinum   Essential hypertension    bp in fair control at this time -but levels are higher at home  BP Readings from Last 1 Encounters:  10/11/16 130/74  will begin lisinopril 10 mg daily for bp and also renal protection   Disc lifstyle change with low sodium diet and exercise  Labs reviewed       Relevant Medications   lisinopril (PRINIVIL,ZESTRIL) 10 MG tablet   Other Relevant Orders   Comprehensive metabolic panel     Respiratory   Acute sinusitis    Suspect this is worsening frontal ha  tx with zithromax (she is pcn all) Nasal saline Disc use of zyrtec for allergies instead of claritin as well  Update if not starting to improve in a week or if worsening        Relevant Medications   azithromycin (ZITHROMAX Z-PAK) 250 MG tablet     Endocrine   Type 2 diabetes mellitus without complications ([place]) - Primary    Lab Results  Component Value Date   HGBA1C 9.0 (H) 08/30/2016   Since this time with metformin and diet home glucose levels are improved! Also loosing weight and better exercise and self care in general  Re check in September Start lisinopril for bp and also bp control  Enc to keep up the good work      Relevant Medications   lisinopril (PRINIVIL,ZESTRIL) 10 MG tablet   Other Relevant Orders   Hemoglobin A1c     Other   ANEMIA, B12 DEFICIENCY    B12 shot today  She will add B12 1000 mcg daily  Re check for f/u       Relevant Medications   cyanocobalamin ((VITAMIN B-12)) injection 1,000 mcg (Completed)   Other Relevant Orders   Vitamin B12   Chronic headache    This may be multifactorial (gave up caffeine) Good sleep and hydration habits - given handout on chronic ha Strongly suspect sinusitis- with allergies (will tx with zpak and zyrtec) Update if not improving with treatment       Hyperlipidemia with target low density lipoprotein (LDL) cholesterol less than 100 mg/dL    Better diet Disc goals for lipids and reasons to control them Rev labs with pt Rev low sat fat diet in detail Expect imp at  sept visit for re check  Goal is LDL under 70 ultimately  Statin if not improving /consider      Relevant Medications   lisinopril (PRINIVIL,ZESTRIL) 10 MG tablet    Other Relevant Orders   Comprehensive metabolic panel   Lipid panel   Obesity    Discussed how this problem influences overall health and the risks it imposes  Reviewed plan for weight loss with lower calorie diet (via better food choices and also portion control or program like weight watchers) and exercise building up to or more than 30 minutes 5 days per week including some aerobic activity   Commended 10 lb loss so far/ she plans to keep it up      THROMBOCYTOPENIA    Platelets 143- reassuring No bruising or bleeding  Continue to monitor

## 2016-10-11 NOTE — Assessment & Plan Note (Signed)
This may be multifactorial (gave up caffeine) Good sleep and hydration habits - given handout on chronic ha Strongly suspect sinusitis- with allergies (will tx with zpak and zyrtec) Update if not improving with treatment

## 2016-10-11 NOTE — Assessment & Plan Note (Signed)
B12 shot today  She will add B12 1000 mcg daily  Re check for f/u

## 2016-10-11 NOTE — Assessment & Plan Note (Signed)
Discussed how this problem influences overall health and the risks it imposes  Reviewed plan for weight loss with lower calorie diet (via better food choices and also portion control or program like weight watchers) and exercise building up to or more than 30 minutes 5 days per week including some aerobic activity   Commended 10 lb loss so far/ she plans to keep it up

## 2016-10-11 NOTE — Assessment & Plan Note (Signed)
Suspect this is worsening frontal ha  tx with zithromax (she is pcn all) Nasal saline Disc use of zyrtec for allergies instead of claritin as well  Update if not starting to improve in a week or if worsening

## 2016-10-11 NOTE — Assessment & Plan Note (Addendum)
bp in fair control at this time -but levels are higher at home  BP Readings from Last 1 Encounters:  10/11/16 130/74  will begin lisinopril 10 mg daily for bp and also renal protection  Disc lifstyle change with low sodium diet and exercise  Labs reviewed

## 2016-10-13 NOTE — Assessment & Plan Note (Signed)
Platelets 143- reassuring No bruising or bleeding  Continue to monitor

## 2016-10-16 ENCOUNTER — Encounter: Payer: Self-pay | Admitting: Family Medicine

## 2016-10-18 ENCOUNTER — Telehealth: Payer: Self-pay

## 2016-10-18 NOTE — Telephone Encounter (Signed)
Pt notified of Dr. Marliss Coots instructions and verbalized understanding, she will update [place] next week

## 2016-10-18 NOTE — Telephone Encounter (Signed)
Hold the lisinopril over the weekend to see if cough stops- then call early next week and we will px something else if the cough is better If it ends up being a cold instead -that would be good to know

## 2016-10-18 NOTE — Telephone Encounter (Signed)
Pt left v/m; pt is taking lisinopril 10 mg and now pt is doing a lot of coughing with flu like symptoms; pt thinks lisinopril is causing and request different BP med.pt was seen 10/11/16; pt request cb. [facility].

## 2016-11-08 ENCOUNTER — Ambulatory Visit (INDEPENDENT_AMBULATORY_CARE_PROVIDER_SITE_OTHER): Payer: Commercial Managed Care - PPO | Admitting: Family Medicine

## 2016-11-08 ENCOUNTER — Encounter: Payer: Self-pay | Admitting: Family Medicine

## 2016-11-08 VITALS — BP 124/82 | HR 67 | Temp 98.1°F | Ht 63.0 in | Wt 191.2 lb

## 2016-11-08 DIAGNOSIS — J0100 Acute maxillary sinusitis, unspecified: Secondary | ICD-10-CM

## 2016-11-08 MED ORDER — DOXYCYCLINE HYCLATE 100 MG PO TABS: 100.0000 mg | ORAL_TABLET | Freq: Two times a day (BID) | ORAL | 0 refills | Status: DC

## 2016-11-08 MED ORDER — FLUTICASONE PROPIONATE 50 MCG/ACT NA SUSP: 2.0000 | Freq: Every day | NASAL | 11 refills | Status: DC

## 2016-11-08 MED ORDER — HYDROCODONE-HOMATROPINE 5-1.5 MG/5ML PO SYRP: 5.0000 mL | ORAL_SOLUTION | Freq: Every evening | ORAL | 0 refills | Status: DC | PRN

## 2016-11-08 MED ORDER — BENZONATATE 200 MG PO CAPS: 200.0000 mg | ORAL_CAPSULE | Freq: Three times a day (TID) | ORAL | 1 refills | Status: DC | PRN

## 2016-11-08 NOTE — Patient Instructions (Signed)
I think you have a sinus infection with cough and also chronic congestion   Continue claritin  Add flonase nasal spray every day  Drink lots of fluids For sinus infection take doxycycline as directed For cough- tessalon three times daily  Hycodan -at night (caution of sedation)   Update if not starting to improve in a week or if worsening    Stop the over the counter medicines If you run a fever over 100.0 let me know   If you can't swallow because throat hurts severely let me know

## 2016-11-08 NOTE — Progress Notes (Signed)
Subjective:    Patient ID: [name], female    DOB: 1960/04/16, 56 y.o.   MRN: [id number]  HPI  Here for cough and st   Since July  Had a cold  Cough is bad  Back of throat is sore - ? Red and painful  Cough is making it worse / gagging  Not very productive  Some  pnd  No wheeze or sob  Took z pak    Had some chills and aches   Held her lisinopril and it did not help   Some ear pain  Some nasal congestion / sinus under eyes  Mucous is clear from her nose when it will come out   Taking claritin  Off brand cough med- for cough and congestion ? What is in it    Patient Active Problem List   Diagnosis Date Noted  . Essential hypertension 10/11/2016  . Acute sinusitis 10/11/2016  . Screening mammogram, encounter for 08/30/2016  . Routine general medical examination at a health care facility 08/30/2016  . Encounter for screening for HIV 08/30/2016  . Need for hepatitis C screening test 08/30/2016  . History of herpes zoster 05/12/2015  . Hyperlipidemia with target low density lipoprotein (LDL) cholesterol less than 100 mg/dL 12/21/2012  . Obesity 12/21/2012  . Vaginal atrophy 12/18/2012  . Dyspepsia 08/20/2012  . Rectal bleeding 12/04/2011  . Lower back pain 12/04/2011  . ELEVATED BLOOD PRESSURE 06/04/2010  . Diseases of lips 04/10/2010  . ANEMIA, B12 DEFICIENCY 10/05/2008  . THROMBOCYTOPENIA 10/05/2008  . Allergic rhinitis 10/05/2008  . Chronic headache 10/05/2008  . FREQUENCY, URINARY 10/05/2008  . Type 2 diabetes mellitus without complications ([place]) 50/53/9767  . ANEMIA, IRON DEFICIENCY 01/26/2008   Past Medical History:  Diagnosis Date  . Anemia   . Arthritis   . Diabetes mellitus   . Fibroids   . High frequency hearing loss   . History of abnormal Pap smear   . Hx of migraine headaches   . Labile blood pressure   . Pelvic pain   . Vaginal itching    Past Surgical History:  Procedure Laterality Date  . BUNIONECTOMY    . COLONOSCOPY   03/2007   neg  . FLEXIBLE SIGMOIDOSCOPY  2000  . LAPAROSCOPIC ASSISTED VAGINAL HYSTERECTOMY  08/2005   Fibroids, menorrhagia.  Benign pathology  . LEEP    . NASAL ENDOSCOPY  03/2007   neg   Social History  Substance Use Topics  . Smoking status: Never Smoker  . Smokeless tobacco: Never Used  . Alcohol use Yes     Comment: occasional/rare   Family History  Problem Relation Age of Onset  . Heart disease Sister   . Arthritis Sister   . Diabetes Maternal Grandmother   . Hypertension Maternal Grandmother   . Lung cancer Father   . Colon cancer Father   . Breast cancer Neg Hx    Allergies  Allergen Reactions  . Amoxicillin-Pot Clavulanate     REACTION: Itchy rash   Current Outpatient Prescriptions on File Prior to Visit  Medication Sig Dispense Refill  . cyclobenzaprine (FLEXERIL) 5 MG tablet TAKE 1 TABLET BY MOUTH 3 TIMES A DAY AS NEEDED FOR MUSCLE SPASMS (WATCH OUT FOR SEDATION) 30 tablet 5  . Diclofenac Sodium 1.5 % SOLN Apply to affected wrist QID 1 Bottle prn  . gabapentin (NEURONTIN) 300 MG capsule Take 1-2 pills by mouth every 8 hours 180 capsule 11  . glucose blood (ONE TOUCH ULTRA  TEST) test strip Pt checks BS twice daily and as directed. Dx 250.00 100 each 3  . lisinopril (PRINIVIL,ZESTRIL) 10 MG tablet Take 1 tablet (10 mg total) by mouth daily. 30 tablet 11  . loratadine (CLARITIN) 10 MG tablet Take 10 mg by mouth as needed.     . metFORMIN (GLUCOPHAGE) 500 MG tablet Take 1 tablet (500 mg total) by mouth 2 (two) times daily with a meal. 60 tablet 11  . naproxen (NAPROSYN) 500 MG tablet Take one tablet twice daily with food for 5 days then as needed 60 tablet 0  . traMADol (ULTRAM) 50 MG tablet Take 1-2 tablets (50-100 mg total) by mouth 3 (three) times daily as needed for moderate pain or severe pain (caution of sedation). 60 tablet 1   No current facility-administered medications on file prior to visit.     Review of Systems  Constitutional: Positive for appetite  change. Negative for fatigue and fever.  HENT: Positive for congestion, ear pain, postnasal drip, rhinorrhea, sinus pressure and sore throat. Negative for nosebleeds.   Eyes: Negative for pain, redness and itching.  Respiratory: Positive for cough. Negative for shortness of breath and wheezing.   Cardiovascular: Negative for chest pain.  Gastrointestinal: Negative for abdominal pain, diarrhea, nausea and vomiting.  Endocrine: Negative for polyuria.  Genitourinary: Negative for dysuria, frequency and urgency.  Musculoskeletal: Negative for arthralgias and myalgias.  Allergic/Immunologic: Negative for immunocompromised state.  Neurological: Positive for headaches. Negative for dizziness, tremors, syncope, weakness and numbness.  Hematological: Negative for adenopathy. Does not bruise/bleed easily.  Psychiatric/Behavioral: Negative for dysphoric mood. The patient is not nervous/anxious.        Objective:   Physical Exam  Constitutional: She appears well-developed and well-nourished. No distress.  HENT:  Head: Normocephalic and atraumatic.  Right Ear: External ear normal.  Left Ear: External ear normal.  Mouth/Throat: Oropharynx is clear and moist. No oropharyngeal exudate.  Nares are injected and congested  Bilateral maxillary and frontal  sinus tenderness  Post nasal drip   Eyes: Pupils are equal, round, and reactive to light. Conjunctivae and EOM are normal. Right eye exhibits no discharge. Left eye exhibits no discharge.  Neck: Normal range of motion. Neck supple.  Cardiovascular: Normal rate and regular rhythm.   Pulmonary/Chest: Effort normal and breath sounds normal. No respiratory distress. She has no wheezes. She has no rales.  Harsh bs No rales/rhonchi or wheeze  Lymphadenopathy:    She has no cervical adenopathy.  Neurological: She is alert. No cranial nerve deficit.  Skin: Skin is warm and dry. No rash noted.  Psychiatric: She has a normal mood and affect.            Assessment & Plan:   Problem List Items Addressed This Visit      Respiratory   Acute sinusitis - Primary    With ongoing cough-no imp after holding ace and taking zpak  Doxycycline Tessalon  Hycodan at night  Allergy meds- claritin/flonase Disc symptomatic care - see instructions on AVS  Update if not starting to improve in a week or if worsening        Relevant Medications   doxycycline (VIBRA-TABS) 100 MG tablet   fluticasone (FLONASE) 50 MCG/ACT nasal spray   benzonatate (TESSALON) 200 MG capsule   HYDROcodone-homatropine (HYCODAN) 5-1.5 MG/5ML syrup

## 2016-11-10 NOTE — Assessment & Plan Note (Signed)
With ongoing cough-no imp after holding ace and taking zpak  Doxycycline Tessalon  Hycodan at night  Allergy meds- claritin/flonase Disc symptomatic care - see instructions on AVS  Update if not starting to improve in a week or if worsening

## 2016-11-21 ENCOUNTER — Other Ambulatory Visit: Payer: Self-pay | Admitting: Family Medicine

## 2016-12-19 ENCOUNTER — Other Ambulatory Visit: Payer: Commercial Managed Care - PPO

## 2016-12-20 ENCOUNTER — Other Ambulatory Visit (INDEPENDENT_AMBULATORY_CARE_PROVIDER_SITE_OTHER): Payer: Commercial Managed Care - PPO

## 2016-12-20 DIAGNOSIS — D518 Other vitamin B12 deficiency anemias: Secondary | ICD-10-CM

## 2016-12-20 DIAGNOSIS — E119 Type 2 diabetes mellitus without complications: Secondary | ICD-10-CM | POA: Diagnosis not present

## 2016-12-20 DIAGNOSIS — I10 Essential (primary) hypertension: Secondary | ICD-10-CM

## 2016-12-20 DIAGNOSIS — Z794 Long term (current) use of insulin: Secondary | ICD-10-CM | POA: Diagnosis not present

## 2016-12-20 DIAGNOSIS — E785 Hyperlipidemia, unspecified: Secondary | ICD-10-CM

## 2016-12-20 LAB — COMPREHENSIVE METABOLIC PANEL
ALT: 12 U/L (ref 0–35)
AST: 16 U/L (ref 0–37)
Albumin: 4.1 g/dL (ref 3.5–5.2)
Alkaline Phosphatase: 69 U/L (ref 39–117)
BUN: 13 mg/dL (ref 6–23)
CHLORIDE: 105 meq/L (ref 96–112)
CO2: 27 mEq/L (ref 19–32)
Calcium: 9.5 mg/dL (ref 8.4–10.5)
Creatinine, Ser: 0.75 mg/dL (ref 0.40–1.20)
GFR: 102.8 mL/min (ref >60.00)
GLUCOSE: 217 mg/dL — AB (ref 70–99)
POTASSIUM: 4.1 meq/L (ref 3.5–5.1)
SODIUM: 139 meq/L (ref 135–145)
Total Bilirubin: 0.4 mg/dL (ref 0.2–1.2)
Total Protein: 7.1 g/dL (ref 6.0–8.3)

## 2016-12-20 LAB — LIPID PANEL
CHOL/HDL RATIO: 4
Cholesterol: 174 mg/dL (ref 0–200)
HDL: 49.4 mg/dL (ref >39.00)
LDL Cholesterol: 105 mg/dL — ABNORMAL HIGH (ref 0–99)
NONHDL: 124.69
Triglycerides: 100 mg/dL (ref 0.0–149.0)
VLDL: 20 mg/dL (ref 0.0–40.0)

## 2016-12-20 LAB — HEMOGLOBIN A1C: Hgb A1c MFr Bld: 7.6 % — ABNORMAL HIGH (ref 4.6–6.5)

## 2016-12-20 LAB — VITAMIN B12: Vitamin B-12: 121 pg/mL — ABNORMAL LOW (ref 211–911)

## 2016-12-24 ENCOUNTER — Other Ambulatory Visit: Payer: Commercial Managed Care - PPO

## 2016-12-25 ENCOUNTER — Ambulatory Visit: Payer: Commercial Managed Care - PPO | Admitting: Family Medicine

## 2017-01-19 ENCOUNTER — Other Ambulatory Visit: Payer: Self-pay | Admitting: Family Medicine

## 2017-01-20 NOTE — Telephone Encounter (Signed)
Narcotic cough syrup-cannot refill without visit and eval  Is she sick?

## 2017-01-20 NOTE — Telephone Encounter (Signed)
Last filled on 11/08/16 #120 ml with 0 refills, last OV was 10/11/16, please advise

## 2017-01-21 NOTE — Telephone Encounter (Signed)
Left voicemail letting pt know Dr. Marliss Coots comments and requested her to call back with an update on how she is feeling and what sxs is she having to need the hycodan

## 2017-02-13 ENCOUNTER — Other Ambulatory Visit: Payer: Self-pay | Admitting: Family Medicine

## 2017-02-17 NOTE — Telephone Encounter (Signed)
Will route to Dr. Lorelei Pont since he is seeing pt tomorrow for URI sxs

## 2017-02-18 ENCOUNTER — Other Ambulatory Visit: Payer: Self-pay

## 2017-02-18 ENCOUNTER — Ambulatory Visit: Payer: Commercial Managed Care - PPO | Admitting: Family Medicine

## 2017-02-18 ENCOUNTER — Encounter: Payer: Self-pay | Admitting: Family Medicine

## 2017-02-18 VITALS — BP 140/90 | HR 86 | Temp 98.3°F | Ht 63.0 in | Wt 191.8 lb

## 2017-02-18 DIAGNOSIS — J189 Pneumonia, unspecified organism: Secondary | ICD-10-CM

## 2017-02-18 DIAGNOSIS — E119 Type 2 diabetes mellitus without complications: Secondary | ICD-10-CM | POA: Diagnosis not present

## 2017-02-18 DIAGNOSIS — I10 Essential (primary) hypertension: Secondary | ICD-10-CM

## 2017-02-18 MED ORDER — LOSARTAN POTASSIUM 25 MG PO TABS
25.0000 mg | ORAL_TABLET | Freq: Every day | ORAL | 3 refills | Status: DC
Start: 2017-02-18 — End: 2017-07-28

## 2017-02-18 MED ORDER — HYDROCODONE-HOMATROPINE 5-1.5 MG/5ML PO SYRP: ORAL_SOLUTION | ORAL | 0 refills | Status: DC

## 2017-02-18 MED ORDER — AZITHROMYCIN 250 MG PO TABS: ORAL_TABLET | ORAL | 0 refills | Status: DC

## 2017-02-18 NOTE — Progress Notes (Signed)
Dr. Frederico Hamman T. Jamaine Quintin, MD, [facility] [place], [address] Phone: [phone] Fax: [phone]  02/18/2017  Patient: [name], MRN: [id number], DOB: 02-02-1961, 56 y.o.  Primary Physician:  Tower, Wynelle Fanny, MD   Chief Complaint  Patient presents with  . Hypertension    Stopped Lisinopril due to cough  . Cough  . No Appetite   Subjective:   [name] is a 56 y.o. very pleasant female patient who presents with the following:  Feeling bad for > 1 month. Sinuses and congestion. Nausea. Dizziness.  Doesn't feel well in general. Bad cough.  ? If related to lisinopril.   HTN: hs DM.  Was on lisinopril No CP, no sob. No HA.  BP Readings from Last 3 Encounters:  02/18/17 140/90  11/08/16 124/82  10/11/16 865/78    Basic Metabolic Panel:    Component Value Date/Time   NA 139 12/20/2016 0839   NA 139 09/04/2013 0459   NA 139 12/01/2008 1304   K 4.1 12/20/2016 0839   K 4.1 09/04/2013 0459   K 3.8 12/01/2008 1304   CL 105 12/20/2016 0839   CL 107 09/04/2013 0459   CL 105 12/01/2008 1304   CO2 27 12/20/2016 0839   CO2 23 09/04/2013 0459   CO2 27 12/01/2008 1304   BUN 13 12/20/2016 0839   BUN 12 09/04/2013 0459   BUN 13 12/01/2008 1304   CREATININE 0.75 12/20/2016 0839   CREATININE 0.70 09/04/2013 0459   CREATININE 0.8 12/01/2008 1304   GLUCOSE 217 (H) 12/20/2016 0839   GLUCOSE 173 (H) 09/04/2013 0459   GLUCOSE 168 (H) 12/01/2008 1304   CALCIUM 9.5 12/20/2016 0839   CALCIUM 9.0 09/04/2013 0459   CALCIUM 9.2 12/01/2008 1304     Past Medical History, Surgical History, Social History, Family History, Problem List, Medications, and Allergies have been reviewed and updated if relevant.  Patient Active Problem List   Diagnosis Date Noted  . Essential hypertension 10/11/2016  . Acute sinusitis 10/11/2016  . Screening mammogram, encounter for 08/30/2016  . Routine general medical  examination at a health care facility 08/30/2016  . Encounter for screening for HIV 08/30/2016  . Need for hepatitis C screening test 08/30/2016  . History of herpes zoster 05/12/2015  . Hyperlipidemia with target low density lipoprotein (LDL) cholesterol less than 100 mg/dL 12/21/2012  . Obesity 12/21/2012  . Vaginal atrophy 12/18/2012  . Dyspepsia 08/20/2012  . Rectal bleeding 12/04/2011  . Lower back pain 12/04/2011  . ELEVATED BLOOD PRESSURE 06/04/2010  . Diseases of lips 04/10/2010  . ANEMIA, B12 DEFICIENCY 10/05/2008  . THROMBOCYTOPENIA 10/05/2008  . Allergic rhinitis 10/05/2008  . Chronic headache 10/05/2008  . FREQUENCY, URINARY 10/05/2008  . Type 2 diabetes mellitus without complications ([place]) 46/96/2952  . ANEMIA, IRON DEFICIENCY 01/26/2008    Past Medical History:  Diagnosis Date  . Anemia   . Arthritis   . Diabetes mellitus   . Fibroids   . High frequency hearing loss   . History of abnormal Pap smear   . Hx of migraine headaches   . Labile blood pressure   . Pelvic pain   . Vaginal itching     Past Surgical History:  Procedure Laterality Date  . BUNIONECTOMY    . COLONOSCOPY  03/2007   neg  . FLEXIBLE SIGMOIDOSCOPY  2000  . LAPAROSCOPIC ASSISTED VAGINAL HYSTERECTOMY  08/2005   Fibroids, menorrhagia.  Benign pathology  .  LEEP    . NASAL ENDOSCOPY  03/2007   neg    Social History   Socioeconomic History  . Marital status: Single    Spouse name: Not on file  . Number of children: Not on file  . Years of education: Not on file  . Highest education level: Not on file  Social Needs  . Financial resource strain: Not on file  . Food insecurity - worry: Not on file  . Food insecurity - inability: Not on file  . Transportation needs - medical: Not on file  . Transportation needs - non-medical: Not on file  Occupational History  . Not on file  Tobacco Use  . Smoking status: Never Smoker  . Smokeless tobacco: Never Used  Substance and Sexual Activity   . Alcohol use: Yes    Comment: occasional/rare  . Drug use: No  . Sexual activity: Yes    Partners: Male    Birth control/protection: Surgical    Comment: LAVH  Other Topics Concern  . Not on file  Social History Narrative  . Not on file    Family History  Problem Relation Age of Onset  . Heart disease Sister   . Arthritis Sister   . Diabetes Maternal Grandmother   . Hypertension Maternal Grandmother   . Lung cancer Father   . Colon cancer Father   . Breast cancer Neg Hx     Allergies  Allergen Reactions  . Amoxicillin-Pot Clavulanate     REACTION: Itchy rash    Medication list reviewed and updated in full in [place].   GEN: No acute illnesses, no fevers, chills. GI: No n/v/d, eating normally Pulm: No SOB Interactive and getting along well at home.  Otherwise, ROS is as per the HPI.  Objective:   BP 140/90   Pulse 86   Temp 98.3 F (36.8 C) (Oral)   Ht 5\' 3"  (1.6 m)   Wt 191 lb 12 oz (87 kg)   BMI 33.97 kg/m   GEN: WDWN, NAD, Non-toxic, A & O x 3 HEENT: Atraumatic, Normocephalic. Neck supple. No masses, No LAD. Ears and Nose: No external deformity. CV: RRR, No M/G/R. No JVD. No thrill. No extra heart sounds. PULM: CTA B, no wheezes, crackles, rhonchi. No retractions. No resp. distress. No accessory muscle use. EXTR: No c/c/e NEURO Normal gait.  PSYCH: Normally interactive. Conversant. Not depressed or anxious appearing.  Calm demeanor.   Laboratory and Imaging Data:  Assessment and Plan:   Atypical pneumonia  Essential hypertension  Type 2 diabetes mellitus without complication, without long-term current use of insulin ([place])  Cover for atypical pna and pertussis given 1 mo los  D/c ace, start arb for bp  Follow-up: No Follow-up on file.  No future appointments.  Meds ordered this encounter  Medications  . losartan (COZAAR) 25 MG tablet    Sig: Take 1 tablet (25 mg total) daily by mouth.    Dispense:  90 tablet    Refill:  3   . azithromycin (ZITHROMAX) 250 MG tablet    Sig: 2 tabs po on day 1, then 1 tab po for 4 days    Dispense:  6 tablet    Refill:  0  . HYDROcodone-homatropine (HYCODAN) 5-1.5 MG/5ML syrup    Sig: 1 tsp po tid prn for cough    Dispense:  75 mL    Refill:  0   Medications Discontinued During This Encounter  Medication Reason  . doxycycline (VIBRA-TABS) 100  MG tablet Completed Course  . benzonatate (TESSALON) 200 MG capsule Completed Course  . HYDROcodone-homatropine (HYCODAN) 5-1.5 MG/5ML syrup Completed Course  . lisinopril (PRINIVIL,ZESTRIL) 10 MG tablet Completed Course   No orders of the defined types were placed in this encounter.   Signed,  Maud Deed. Gabrella Stroh, MD   Allergies as of 02/18/2017      Reactions   Amoxicillin-pot Clavulanate    REACTION: Itchy rash   Ace Inhibitors Cough      Medication List        Accurate as of 02/18/17  8:59 AM. Always use your most recent med list.          azithromycin 250 MG tablet Commonly known as:  ZITHROMAX 2 tabs po on day 1, then 1 tab po for 4 days   cyclobenzaprine 5 MG tablet Commonly known as:  FLEXERIL TAKE 1 TABLET BY MOUTH 3 TIMES A DAY AS NEEDED FOR MUSCLE SPASMS (WATCH OUT FOR SEDATION)   Diclofenac Sodium 1.5 % Soln Apply to affected wrist QID   fluticasone 50 MCG/ACT nasal spray Commonly known as:  FLONASE Place 2 sprays into both nostrils daily.   gabapentin 300 MG capsule Commonly known as:  NEURONTIN Take 1-2 pills by mouth every 8 hours   glucose blood test strip Commonly known as:  ONE TOUCH ULTRA TEST Pt checks BS twice daily and as directed. Dx 250.00   HYDROcodone-homatropine 5-1.5 MG/5ML syrup Commonly known as:  HYCODAN 1 tsp po tid prn for cough   loratadine 10 MG tablet Commonly known as:  CLARITIN Take 10 mg by mouth as needed.   losartan 25 MG tablet Commonly known as:  COZAAR Take 1 tablet (25 mg total) daily by mouth.   metFORMIN 500 MG tablet Commonly known as:   GLUCOPHAGE TAKE 1 TABLET BY MOUTH TWICE A DAY WITH MEALS   naproxen 500 MG tablet Commonly known as:  NAPROSYN Take one tablet twice daily with food for 5 days then as needed   traMADol 50 MG tablet Commonly known as:  ULTRAM Take 1-2 tablets (50-100 mg total) by mouth 3 (three) times daily as needed for moderate pain or severe pain (caution of sedation).

## 2017-02-28 ENCOUNTER — Ambulatory Visit: Payer: Commercial Managed Care - PPO | Admitting: Primary Care

## 2017-04-10 ENCOUNTER — Encounter: Payer: Self-pay | Admitting: Family Medicine

## 2017-04-14 NOTE — Telephone Encounter (Signed)
Left VM requesting pt to call the office so I can schedule her an appt

## 2017-04-15 NOTE — Telephone Encounter (Signed)
Sent mychart message asking pt to call the office and schedule an appt

## 2017-05-20 LAB — HM DIABETES EYE EXAM

## 2017-07-01 ENCOUNTER — Encounter: Payer: Self-pay | Admitting: Gastroenterology

## 2017-07-17 ENCOUNTER — Ambulatory Visit: Payer: Commercial Managed Care - PPO | Admitting: Family Medicine

## 2017-07-28 ENCOUNTER — Encounter: Payer: Self-pay | Admitting: Podiatry

## 2017-07-28 ENCOUNTER — Ambulatory Visit: Payer: Commercial Managed Care - PPO | Admitting: Podiatry

## 2017-07-28 ENCOUNTER — Ambulatory Visit (INDEPENDENT_AMBULATORY_CARE_PROVIDER_SITE_OTHER): Payer: Commercial Managed Care - PPO

## 2017-07-28 DIAGNOSIS — M2022 Hallux rigidus, left foot: Secondary | ICD-10-CM | POA: Diagnosis not present

## 2017-07-28 DIAGNOSIS — M79673 Pain in unspecified foot: Secondary | ICD-10-CM | POA: Diagnosis not present

## 2017-07-28 DIAGNOSIS — E119 Type 2 diabetes mellitus without complications: Secondary | ICD-10-CM

## 2017-07-28 NOTE — Progress Notes (Signed)
Subjective:  Patient ID: [name], female    DOB: [birth year],  MRN: [id number] HPI Chief Complaint  Patient presents with  . Foot Ulcer    Patient presents today for painful lesion between left 4th and 5th toes x 3 months.  She states she has been using OTC fungal cream with some relief.  She reports her 5th metatarsal hurts and is tender to touch.  She also reports having right painful bunion x 1 month.  She states her bunion has been swelling, but is now starting to go down some  . Foot Pain  . New Patient (Initial Visit)    57 y.o. female presents with the above complaint.   ROS: Denies fever chills nausea vomiting muscle aches pains calf pain back pain chest pain shortness of breath and headache.  Past Medical History:  Diagnosis Date  . Anemia   . Arthritis   . Diabetes mellitus   . Fibroids   . High frequency hearing loss   . History of abnormal Pap smear   . Hx of migraine headaches   . Labile blood pressure   . Pelvic pain   . Vaginal itching    Past Surgical History:  Procedure Laterality Date  . BUNIONECTOMY    . COLONOSCOPY  03/2007   neg  . FLEXIBLE SIGMOIDOSCOPY  2000  . LAPAROSCOPIC ASSISTED VAGINAL HYSTERECTOMY  08/2005   Fibroids, menorrhagia.  Benign pathology  . LEEP    . NASAL ENDOSCOPY  03/2007   neg    Current Outpatient Medications:  .  fluticasone (FLONASE) 50 MCG/ACT nasal spray, Place 2 sprays into both nostrils daily., Disp: 16 g, Rfl: 11 .  gabapentin (NEURONTIN) 300 MG capsule, Take 1-2 pills by mouth every 8 hours, Disp: 180 capsule, Rfl: 11 .  glucose blood (ONE TOUCH ULTRA TEST) test strip, Pt checks BS twice daily and as directed. Dx 250.00, Disp: 100 each, Rfl: 3 .  loratadine (CLARITIN) 10 MG tablet, Take 10 mg by mouth as needed. , Disp: , Rfl:  .  metFORMIN (GLUCOPHAGE) 500 MG tablet, TAKE 1 TABLET BY MOUTH TWICE A DAY WITH MEALS, Disp: 60 tablet, Rfl: 1  Allergies  Allergen Reactions  . Amoxicillin-Pot Clavulanate    REACTION: Itchy rash  . Ace Inhibitors Cough   Review of Systems Objective:  There were no vitals filed for this visit.  General: Well developed, nourished, in no acute distress, alert and oriented x3   Dermatological: Skin is warm, dry and supple bilateral. Nails x 10 are well maintained; remaining integument appears unremarkable at this time. There are no open sores, no preulcerative lesions, no rash or signs of infection present.  Heloma molle appears to be resolving no signs of cellulitis interdigital space for left.  Vascular: Dorsalis Pedis artery and Posterior Tibial artery pedal pulses are 2/4 bilateral with immedate capillary fill time. Pedal hair growth present. No varicosities and no lower extremity edema present bilateral.   Neruologic: Grossly intact via light touch bilateral. Vibratory intact via tuning fork bilateral. Protective threshold with Semmes Wienstein monofilament intact to all pedal sites bilateral. Patellar and Achilles deep tendon reflexes 2+ bilateral. No Babinski or clonus noted bilateral.   Musculoskeletal: No gross boney pedal deformities bilateral. No pain, crepitus, or limitation noted with foot and ankle range of motion bilateral. Muscular strength 5/5 in all groups tested bilateral.  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  Gait: Unassisted, Nonantalgic.    Radiographs:  Radiographs taken today  demonstrate no acute findings.  Chronic findings consist of a moderate to severe hallux rigidus first metatarsophalangeal joint left foot with bone to bone ankylosing and spurring.  Right foot does demonstrate a first metatarsal osteotomy with K wire fixation which is retained.  Assessment & Plan:   Assessment: Heloma molle secondary to a deep sulcus left fourth intermetatarsal space/interdigital space.  Hallux rigidus first metatarsophalangeal joint left.  Plan: Discussed etiology pathology conservative versus surgical  therapies.  At this point encouraged her to continue antifungal since it seems to be resolving.  I placed Castellani's pain in the area today she will continue to cleanse daily with alcohol and apply a small amount of an antifungal from over-the-counter that she is currently using.  We did discuss the need for surgical intervention regarding a Keller arthroplasty with a single silicone implant left first metatarsophalangeal joint and possible syndactylization soft tissues fifth toe.     Vung Kush T. [place], [place]

## 2017-08-13 ENCOUNTER — Ambulatory Visit: Payer: Commercial Managed Care - PPO | Admitting: Gastroenterology

## 2017-08-15 ENCOUNTER — Encounter: Payer: Self-pay | Admitting: Gastroenterology

## 2017-08-15 ENCOUNTER — Ambulatory Visit: Payer: Commercial Managed Care - PPO | Admitting: Gastroenterology

## 2017-08-15 VITALS — BP 170/100 | HR 72 | Ht 63.0 in | Wt 196.4 lb

## 2017-08-15 DIAGNOSIS — R1310 Dysphagia, unspecified: Secondary | ICD-10-CM | POA: Diagnosis not present

## 2017-08-15 DIAGNOSIS — Z800 Family history of malignant neoplasm of digestive organs: Secondary | ICD-10-CM | POA: Diagnosis not present

## 2017-08-15 DIAGNOSIS — R1013 Epigastric pain: Secondary | ICD-10-CM | POA: Diagnosis not present

## 2017-08-15 DIAGNOSIS — K219 Gastro-esophageal reflux disease without esophagitis: Secondary | ICD-10-CM

## 2017-08-15 DIAGNOSIS — G8929 Other chronic pain: Secondary | ICD-10-CM | POA: Diagnosis not present

## 2017-08-15 DIAGNOSIS — K625 Hemorrhage of anus and rectum: Secondary | ICD-10-CM

## 2017-08-15 DIAGNOSIS — K648 Other hemorrhoids: Secondary | ICD-10-CM

## 2017-08-15 MED ORDER — OMEPRAZOLE 20 MG PO CPDR: 20.0000 mg | DELAYED_RELEASE_CAPSULE | Freq: Every day | ORAL | 3 refills | Status: DC

## 2017-08-15 MED ORDER — NA SULFATE-K SULFATE-MG SULF 17.5-3.13-1.6 GM/177ML PO SOLN: 1.0000 | Freq: Once | ORAL | 0 refills | Status: AC

## 2017-08-15 NOTE — Patient Instructions (Addendum)
You have been scheduled for a colonoscopy. Please follow written instructions given to you at your visit today.  Please pick up your prep supplies at the pharmacy within the next 1-3 days. If you use inhalers (even only as needed), please bring them with you on the day of your procedure. Your physician has requested that you go to www.startemmi.com and enter the access code given to you at your visit today. This web site gives a general overview about your procedure. However, you should still follow specific instructions given to you by our office regarding your  preparation for the procedure.   HOLD METFORMIN the morning of your procedure   We will send Omeprazole to yourpharmacy  Use Benefiber 1 tablespoon three times a day with meals  Take Miralax 1 capful daily as needed   Gastroesophageal Reflux Disease, Adult Normally, food travels down the esophagus and stays in the stomach to be digested. However, when a person has gastroesophageal reflux disease (GERD), food and stomach acid move back up into the esophagus. When this happens, the esophagus becomes sore and inflamed. Over time, GERD can create small holes (ulcers) in the lining of the esophagus. What are the causes? This condition is caused by a problem with the muscle between the esophagus and the stomach (lower esophageal sphincter, or LES). Normally, the LES muscle closes after food passes through the esophagus to the stomach. When the LES is weakened or abnormal, it does not close properly, and that allows food and stomach acid to go back up into the esophagus. The LES can be weakened by certain dietary substances, medicines, and medical conditions, including:  Tobacco use.  Pregnancy.  Having a hiatal hernia.  Heavy alcohol use.  Certain foods and beverages, such as coffee, chocolate, onions, and peppermint.  What increases the risk? This condition is more likely to develop in:  People who have an increased body  weight.  People who have connective tissue disorders.  People who use NSAID medicines.  What are the signs or symptoms? Symptoms of this condition include:  Heartburn.  Difficult or painful swallowing.  The feeling of having a lump in the throat.  Abitter taste in the mouth.  Bad breath.  Having a large amount of saliva.  Having an upset or bloated stomach.  Belching.  Chest pain.  Shortness of breath or wheezing.  Ongoing (chronic) cough or a night-time cough.  Wearing away of tooth enamel.  Weight loss.  Different conditions can cause chest pain. Make sure to see your health care provider if you experience chest pain. How is this diagnosed? Your health care provider will take a medical history and perform a physical exam. To determine if you have mild or severe GERD, your health care provider may also monitor how you respond to treatment. You may also have other tests, including:  An endoscopy toexamine your stomach and esophagus with a small camera.  A test thatmeasures the acidity level in your esophagus.  A test thatmeasures how much pressure is on your esophagus.  A barium swallow or modified barium swallow to show the shape, size, and functioning of your esophagus.  How is this treated? The goal of treatment is to help relieve your symptoms and to prevent complications. Treatment for this condition may vary depending on how severe your symptoms are. Your health care provider may recommend:  Changes to your diet.  Medicine.  Surgery.  Follow these instructions at home: Diet  Follow a diet as recommended by your  health care provider. This may involve avoiding foods and drinks such as: ? Coffee and tea (with or without caffeine). ? Drinks that containalcohol. ? Energy drinks and sports drinks. ? Carbonated drinks or sodas. ? Chocolate and cocoa. ? Peppermint and mint flavorings. ? Garlic and onions. ? Horseradish. ? Spicy and acidic foods,  including peppers, chili powder, curry powder, vinegar, hot sauces, and barbecue sauce. ? Citrus fruit juices and citrus fruits, such as oranges, lemons, and limes. ? Tomato-based foods, such as red sauce, chili, salsa, and pizza with red sauce. ? Fried and fatty foods, such as donuts, french fries, potato chips, and high-fat dressings. ? High-fat meats, such as hot dogs and fatty cuts of red and white meats, such as rib eye steak, sausage, ham, and bacon. ? High-fat dairy items, such as whole milk, butter, and cream cheese.  Eat small, frequent meals instead of large meals.  Avoid drinking large amounts of liquid with your meals.  Avoid eating meals during the 2-3 hours before bedtime.  Avoid lying down right after you eat.  Do not exercise right after you eat. General instructions  Pay attention to any changes in your symptoms.  Take over-the-counter and prescription medicines only as told by your health care provider. Do not take aspirin, ibuprofen, or other NSAIDs unless your health care provider told you to do so.  Do not use any tobacco products, including cigarettes, chewing tobacco, and e-cigarettes. If you need help quitting, ask your health care provider.  Wear loose-fitting clothing. Do not wear anything tight around your waist that causes pressure on your abdomen.  Raise (elevate) the head of your bed 6 inches (15cm).  Try to reduce your stress, such as with yoga or meditation. If you need help reducing stress, ask your health care provider.  If you are overweight, reduce your weight to an amount that is healthy for you. Ask your health care provider for guidance about a safe weight loss goal.  Keep all follow-up visits as told by your health care provider. This is important. Contact a health care provider if:  You have new symptoms.  You have unexplained weight loss.  You have difficulty swallowing, or it hurts to swallow.  You have wheezing or a persistent  cough.  Your symptoms do not improve with treatment.  You have a hoarse voice. Get help right away if:  You have pain in your arms, neck, jaw, teeth, or back.  You feel sweaty, dizzy, or light-headed.  You have chest pain or shortness of breath.  You vomit and your vomit looks like blood or coffee grounds.  You faint.  Your stool is bloody or black.  You cannot swallow, drink, or eat. This information is not intended to replace advice given to you by your health care provider. Make sure you discuss any questions you have with your health care provider. Document Released: 12/26/2004 Document Revised: 08/16/2015 Document Reviewed: 07/13/2014 Elsevier Interactive Patient Education  2018 [employer].   Constipation, Adult Constipation is when a person:  Poops (has a bowel movement) fewer times in a week than normal.  Has a hard time pooping.  Has poop that is dry, hard, or bigger than normal.  Follow these instructions at home: Eating and drinking   Eat foods that have a lot of fiber, such as: ? Fresh fruits and vegetables. ? Whole grains. ? Beans.  Eat less of foods that are high in fat, low in fiber, or overly processed, such as: ?  [place] fries. ? Hamburgers. ? Cookies. ? Candy. ? Soda.  Drink enough fluid to keep your pee (urine) clear or pale yellow. General instructions  Exercise regularly or as told by your doctor.  Go to the restroom when you feel like you need to poop. Do not hold it in.  Take over-the-counter and prescription medicines only as told by your doctor. These include any fiber supplements.  Do pelvic floor retraining exercises, such as: ? Doing deep breathing while relaxing your lower belly (abdomen). ? Relaxing your pelvic floor while pooping.  Watch your condition for any changes.  Keep all follow-up visits as told by your doctor. This is important. Contact a doctor if:  You have pain that gets worse.  You have a fever.  You  have not pooped for 4 days.  You throw up (vomit).  You are not hungry.  You lose weight.  You are bleeding from the anus.  You have thin, pencil-like poop (stool). Get help right away if:  You have a fever, and your symptoms suddenly get worse.  You leak poop or have blood in your poop.  Your belly feels hard or bigger than normal (is bloated).  You have very bad belly pain.  You feel dizzy or you faint. This information is not intended to replace advice given to you by your health care provider. Make sure you discuss any questions you have with your health care provider. Document Released: 09/04/2007 Document Revised: 10/06/2015 Document Reviewed: 09/06/2015 Elsevier Interactive Patient Education  2018 [employer].  Use Preparation H OTC suppositories at bedtime as needed

## 2017-08-15 NOTE — Progress Notes (Signed)
[name]    [id number]    Apr 04, 1960  Primary Care Physician:Tower, Wynelle Fanny, MD  Referring Physician: Tower, Wynelle Fanny, MD [place], [place] [address]  Chief complaint:  Epigastric  Abdominal pain, BRBPR  HPI: 57 year old female here for new patient visit with complaints of epigastric abdominal pain radiating to the left upper quadrant worse in the past 6 months.  She has loss of appetite.  Also complains of intermittent dysphagia feels that food gets hung up in her throat and also in the epigastric region in the lower chest, worse with solid food.  She is able to get it down with multiple sips of water.  Denies any difficulty swallowing with liquids or pills. Her father passed away last year from colon cancer.  She has noticed change in her bowel habits, unable to completely evacuate.  On most days she has 2 bowel movements per day but does not feel that she has completely evacuated.  She also is having intermittent bright red blood per rectum after a bowel movement when she wipes. 2 BM per day but doesn't feel completely empties  EGD May 29, 2012 diffuse mild mucosal abnormality biopsies were obtained, erosive gastritis small gastric nodule biopsied and normal duodenum.  On pathology mild chronic gastritis, negative for dysplasia or intestinal metaplasia.  Negative for H. pylori.  Esophageal biopsies showed focal increased intraepithelial lymphocytes, negative for dysplasia or malignancy. Colonoscopy May 29, 2012: Redundant colon, small internal hemorrhoids otherwise normal exam  Outpatient Encounter Medications as of 08/15/2017  Medication Sig  . metFORMIN (GLUCOPHAGE) 500 MG tablet TAKE 1 TABLET BY MOUTH TWICE A DAY WITH MEALS  . Phenylephrine-DM-GG-APAP (TYLENOL COLD/FLU SEVERE PO) Take 2 tablets by mouth as needed.  . [DISCONTINUED] fluticasone (FLONASE) 50 MCG/ACT nasal spray Place 2 sprays into both nostrils daily.  . [DISCONTINUED]  gabapentin (NEURONTIN) 300 MG capsule Take 1-2 pills by mouth every 8 hours  . [DISCONTINUED] glucose blood (ONE TOUCH ULTRA TEST) test strip Pt checks BS twice daily and as directed. Dx 250.00  . [DISCONTINUED] loratadine (CLARITIN) 10 MG tablet Take 10 mg by mouth as needed.    No facility-administered encounter medications on file as of 08/15/2017.     Allergies as of 08/15/2017 - Review Complete 08/15/2017  Allergen Reaction Noted  . Amoxicillin-pot clavulanate  07/07/2009  . Ace inhibitors Cough 02/18/2017    Past Medical History:  Diagnosis Date  . Anemia   . Arthritis   . Diabetes mellitus   . Fibroids   . High frequency hearing loss   . History of abnormal Pap smear   . Hx of migraine headaches   . Labile blood pressure   . Pelvic pain   . Vaginal itching     Past Surgical History:  Procedure Laterality Date  . BUNIONECTOMY Bilateral 1982  . COLONOSCOPY  03/2007   neg  . FLEXIBLE SIGMOIDOSCOPY  2000  . LAPAROSCOPIC ASSISTED VAGINAL HYSTERECTOMY  08/2005   Fibroids, menorrhagia.  Benign pathology  . LEEP    . NASAL ENDOSCOPY  03/2007   neg  . TONSILLECTOMY      Family History  Problem Relation Age of Onset  . Heart disease Sister   . Arthritis Sister   . Diabetes Sister   . Hypertension Sister   . Diabetes Maternal Grandmother   . Hypertension Maternal Grandmother   . Lung cancer Father   . Colon cancer Father   .  Breast cancer Neg Hx     Social History   Socioeconomic History  . Marital status: Single    Spouse name: Not on file  . Number of children: 0  . Years of education: Not on file  . Highest education level: Not on file  Occupational History  . Not on file  Social Needs  . Financial resource strain: Not on file  . Food insecurity:    Worry: Not on file    Inability: Not on file  . Transportation needs:    Medical: Not on file    Non-medical: Not on file  Tobacco Use  . Smoking status: Never Smoker  . Smokeless tobacco: Never Used    Substance and Sexual Activity  . Alcohol use: Yes    Comment: occasional/rare  . Drug use: No  . Sexual activity: Yes    Partners: Male    Birth control/protection: Surgical    Comment: LAVH  Lifestyle  . Physical activity:    Days per week: Not on file    Minutes per session: Not on file  . Stress: Not on file  Relationships  . Social connections:    Talks on phone: Not on file    Gets together: Not on file    Attends religious service: Not on file    Active member of club or organization: Not on file    Attends meetings of clubs or organizations: Not on file    Relationship status: Not on file  . Intimate partner violence:    Fear of current or ex partner: Not on file    Emotionally abused: Not on file    Physically abused: Not on file    Forced sexual activity: Not on file  Other Topics Concern  . Not on file  Social History Narrative  . Not on file      Review of systems: Review of Systems  Constitutional: Negative for fever and chills.  HENT: Negative.   Eyes: Negative for blurred vision.  Respiratory: Negative for cough, shortness of breath and wheezing.   Cardiovascular: Negative for chest pain and palpitations.  Gastrointestinal: as per HPI Genitourinary: Negative for dysuria, urgency, frequency and hematuria.  Musculoskeletal: Negative for myalgias, back pain and joint pain.  Skin: Negative for itching and rash.  Neurological: Negative for dizziness, tremors, focal weakness, seizures and loss of consciousness.  Endo/Heme/Allergies: Positive for seasonal allergies.  Psychiatric/Behavioral: Negative for depression, suicidal ideas and hallucinations.  All other systems reviewed and are negative.   Physical Exam: Vitals:   08/15/17 0826  BP: (!) 170/100  Pulse: 72   Body mass index is 34.79 kg/m. Gen:      No acute distress HEENT:  EOMI, sclera anicteric Neck:     No masses; no thyromegaly Lungs:    Clear to auscultation bilaterally; normal  respiratory effort CV:         Regular rate and rhythm; no murmurs Abd:      + bowel sounds; soft, non-tender; no palpable masses, no distension Ext:    No edema; adequate peripheral perfusion Skin:      Warm and dry; no rash Neuro: alert and oriented x 3 Psych: normal mood and affect  Data Reviewed:  Reviewed labs, radiology imaging, old records and pertinent past GI work up   Assessment and Plan/Recommendations:  57 year old female with complaints of intermittent solid dysphagia, GERD, epigastric abdominal pain, intermittent bright red blood per rectum, symptomatic hemorrhoids and constipation  Schedule for EGD with possible  esophageal dilation and biopsies for evaluation of dysphagia and epigastric abdominal pain Start omeprazole 20 mg daily, 30 minutes before breakfast Discussed antireflux measures and lifestyle modifications  Intermittent bright red blood per rectum likely secondary to internal hemorrhoids small volume bleed Preparation H suppository at bedtime as needed Benefiber 1 tablespoon 3 times daily with meals Advised patient to avoid excessive straining  Constipation: Continue MiraLAX 1 capful daily as needed Increase dietary fiber and fluid intake  Family history of colon cancer: Schedule for colonoscopy for screening  If continues to have intermittent small-volume rectal bleed from internal hemorrhoids and has no other etiology on colonoscopy, will consider hemorrhoidal band ligation  The risks and benefits as well as alternatives of endoscopic procedure(s) have been discussed and reviewed. All questions answered. The patient agrees to proceed.   Damaris Hippo , MD [phone]    CC: Tower, Wynelle Fanny, MD

## 2017-08-18 ENCOUNTER — Encounter: Payer: Self-pay | Admitting: Gastroenterology

## 2017-08-21 ENCOUNTER — Encounter: Payer: Self-pay | Admitting: Gastroenterology

## 2017-08-21 ENCOUNTER — Other Ambulatory Visit: Payer: Self-pay

## 2017-08-21 ENCOUNTER — Ambulatory Visit (INDEPENDENT_AMBULATORY_CARE_PROVIDER_SITE_OTHER): Payer: Commercial Managed Care - PPO | Admitting: Gastroenterology

## 2017-08-21 VITALS — BP 140/79 | HR 69 | Temp 98.4°F | Resp 15 | Ht 63.0 in | Wt 196.0 lb

## 2017-08-21 DIAGNOSIS — Z1211 Encounter for screening for malignant neoplasm of colon: Secondary | ICD-10-CM

## 2017-08-21 DIAGNOSIS — K629 Disease of anus and rectum, unspecified: Secondary | ICD-10-CM

## 2017-08-21 DIAGNOSIS — R1310 Dysphagia, unspecified: Secondary | ICD-10-CM

## 2017-08-21 DIAGNOSIS — Z800 Family history of malignant neoplasm of digestive organs: Secondary | ICD-10-CM

## 2017-08-21 DIAGNOSIS — K319 Disease of stomach and duodenum, unspecified: Secondary | ICD-10-CM | POA: Diagnosis not present

## 2017-08-21 MED ORDER — SODIUM CHLORIDE 0.9 % IV SOLN: 500.0000 mL | Freq: Once | INTRAVENOUS | Status: DC

## 2017-08-21 NOTE — Patient Instructions (Addendum)
** We took multiple biopsies today. You will get a letter in the mail with results in 1-3 weeks ** Handout given on hiatal hernia. Use preparation H AC suppositories for the next 5-7 days   YOU HAD AN ENDOSCOPIC PROCEDURE TODAY AT [facility]:   Refer to the procedure report that was given to you for any specific questions about what was found during the examination.  If the procedure report does not answer your questions, please call your gastroenterologist to clarify.  If you requested that your care partner not be given the details of your procedure findings, then the procedure report has been included in a sealed envelope for you to review at your convenience later.  YOU SHOULD EXPECT: Some feelings of bloating in the abdomen. Passage of more gas than usual.  Walking can help get rid of the air that was put into your GI tract during the procedure and reduce the bloating. If you had a lower endoscopy (such as a colonoscopy or flexible sigmoidoscopy) you may notice spotting of blood in your stool or on the toilet paper. If you underwent a bowel prep for your procedure, you may not have a normal bowel movement for a few days.  Please Note:  You might notice some irritation and congestion in your nose or some drainage.  This is from the oxygen used during your procedure.  There is no need for concern and it should clear up in a day or so.  SYMPTOMS TO REPORT IMMEDIATELY:   Following lower endoscopy (colonoscopy or flexible sigmoidoscopy):  Excessive amounts of blood in the stool  Significant tenderness or worsening of abdominal pains  Swelling of the abdomen that is new, acute  Fever of 100F or higher   Following upper endoscopy (EGD)  Vomiting of blood or coffee ground material  New chest pain or pain under the shoulder blades  Painful or persistently difficult swallowing  New shortness of breath  Fever of 100F or higher  Black, tarry-looking stools  For urgent or  emergent issues, a gastroenterologist can be reached at any hour by calling [phone].   DIET:  We do recommend a small meal at first, but then you may proceed to your regular diet.  Drink plenty of fluids but you should avoid alcoholic beverages for 24 hours.  ACTIVITY:  You should plan to take it easy for the rest of today and you should NOT DRIVE or use heavy machinery until tomorrow (because of the sedation medicines used during the test).    FOLLOW UP: Our staff will call the number listed on your records the next business day following your procedure to check on you and address any questions or concerns that you may have regarding the information given to you following your procedure. If we do not reach you, we will leave a message.  However, if you are feeling well and you are not experiencing any problems, there is no need to return our call.  We will assume that you have returned to your regular daily activities without incident.  If any biopsies were taken you will be contacted by phone or by letter within the next 1-3 weeks.  Please call us at [phone] if you have not heard about the biopsies in 3 weeks.    SIGNATURES/CONFIDENTIALITY: You and/or your care partner have signed paperwork which will be entered into your electronic medical record.  These signatures attest to the fact that that the information above on your  After Visit Summary has been reviewed and is understood.  Full responsibility of the confidentiality of this discharge information lies with you and/or your care-partner.

## 2017-08-21 NOTE — Op Note (Signed)
[facility] Patient Name: [name] Procedure Date: 08/21/2017 9:58 AM MRN: [id number] Endoscopist: Mauri Pole , MD Age: 57 Referring MD:  Date of Birth: Jul 01, 1960 Gender: Female Account #: [id number] Procedure:                Colonoscopy Indications:              Colon cancer screening in patient at increased                            risk: Colorectal cancer in father Medicines:                Monitored Anesthesia Care Procedure:                Pre-Anesthesia Assessment:                           - Prior to the procedure, a History and Physical                            was performed, and patient medications and                            allergies were reviewed. The patient's tolerance of                            previous anesthesia was also reviewed. The risks                            and benefits of the procedure and the sedation                            options and risks were discussed with the patient.                            All questions were answered, and informed consent                            was obtained. Prior Anticoagulants: The patient has                            taken no previous anticoagulant or antiplatelet                            agents. ASA Grade Assessment: II - A patient with                            mild systemic disease. After reviewing the risks                            and benefits, the patient was deemed in                            satisfactory condition to undergo the procedure.  After obtaining informed consent, the colonoscope                            was passed under direct vision. Throughout the                            procedure, the patient's blood pressure, pulse, and                            oxygen saturations were monitored continuously. The                            Colonoscope was introduced through the anus and                            advanced to the the  terminal ileum, with                            identification of the appendiceal orifice and IC                            valve. The colonoscopy was performed without                            difficulty. The patient tolerated the procedure                            well. The quality of the bowel preparation was                            good. The terminal ileum, ileocecal valve,                            appendiceal orifice, and rectum were photographed. Scope In: 10:14:23 AM Scope Out: 10:38:49 AM Scope Withdrawal Time: 0 hours 18 minutes 38 seconds  Total Procedure Duration: 0 hours 24 minutes 26 seconds  Findings:                 The perianal and digital rectal examinations were                            normal.                           A diffuse area of mucosa in the terminal ileum was                            nodular. Biopsies were taken with a cold forceps                            for histology.                           Non-bleeding internal hemorrhoids were found during  retroflexion. The hemorrhoids were medium-sized.                           A patchy area of granular mucosa was found at the                            anus. Biopsies were taken with a cold forceps for                            histology to exclude anal intraepithelial neoplasia. Complications:            No immediate complications. Estimated Blood Loss:     Estimated blood loss was minimal. Impression:               - Nodular ileal mucosa. Biopsied.                           - Non-bleeding internal hemorrhoids.                           - Granularity at the anus. Biopsied. Recommendation:           - Patient has a contact number available for                            emergencies. The signs and symptoms of potential                            delayed complications were discussed with the                            patient. Return to normal activities tomorrow.                             Written discharge instructions were provided to the                            patient.                           - Resume previous diet.                           - Continue present medications.                           - Await pathology results.                           - Repeat colonoscopy in 5 years for screening                            purposes. Mauri Pole, MD 08/21/2017 10:48:15 AM This report has been signed electronically.

## 2017-08-21 NOTE — Progress Notes (Signed)
Report to PACU, RN, vss, BBS= Clear.  

## 2017-08-21 NOTE — Op Note (Signed)
[facility] Patient Name: [name] Procedure Date: 08/21/2017 9:59 AM MRN: [id number] Endoscopist: Mauri Pole , MD Age: 57 Referring MD:  Date of Birth: [birth year] Gender: Female Account #: [id number] Procedure:                Upper GI endoscopy Indications:              Dysphagia Medicines:                Monitored Anesthesia Care Procedure:                Pre-Anesthesia Assessment:                           - Prior to the procedure, a History and Physical                            was performed, and patient medications and                            allergies were reviewed. The patient's tolerance of                            previous anesthesia was also reviewed. The risks                            and benefits of the procedure and the sedation                            options and risks were discussed with the patient.                            All questions were answered, and informed consent                            was obtained. Prior Anticoagulants: The patient has                            taken no previous anticoagulant or antiplatelet                            agents. ASA Grade Assessment: II - A patient with                            mild systemic disease. After reviewing the risks                            and benefits, the patient was deemed in                            satisfactory condition to undergo the procedure.                           - Prior to the procedure, a History and Physical  was performed, and patient medications and                            allergies were reviewed. The patient's tolerance of                            previous anesthesia was also reviewed. The risks                            and benefits of the procedure and the sedation                            options and risks were discussed with the patient.                            All questions were answered, and informed  consent                            was obtained. Prior Anticoagulants: The patient has                            taken no previous anticoagulant or antiplatelet                            agents. ASA Grade Assessment: II - A patient with                            mild systemic disease. After reviewing the risks                            and benefits, the patient was deemed in                            satisfactory condition to undergo the procedure.                           After obtaining informed consent, the endoscope was                            passed under direct vision. Throughout the                            procedure, the patient's blood pressure, pulse, and                            oxygen saturations were monitored continuously. The                            Endoscope was introduced through the mouth, and                            advanced to the second part of duodenum. The upper  GI endoscopy was accomplished without difficulty.                            The patient tolerated the procedure well. Scope In: Scope Out: Findings:                 The Z-line was regular and was found 35 cm from the                            incisors.                           No endoscopic abnormality was evident in the                            esophagus to explain the patient's complaint of                            dysphagia.                           Esophagogastric landmarks were identified: the                            Z-line was found at 35 cm and the site of hiatal                            narrowing was found at 38 cm from the incisors.                           A small hiatal hernia was present.                           Striped mildly erythematous mucosa without bleeding                            was found in the gastric antrum. Biopsies were                            taken with a cold forceps for Helicobacter pylori                             testing using CLOtest.                           The examined duodenum was normal. Complications:            No immediate complications. Estimated Blood Loss:     Estimated blood loss was minimal. Impression:               - Z-line regular, 35 cm from the incisors.                           - No endoscopic esophageal abnormality to explain  patient's dysphagia.                           - Esophagogastric landmarks identified.                           - Small hiatal hernia.                           - Erythematous mucosa in the antrum. Biopsied.                           - Normal examined duodenum. Recommendation:           - Patient has a contact number available for                            emergencies. The signs and symptoms of potential                            delayed complications were discussed with the                            patient. Return to normal activities tomorrow.                            Written discharge instructions were provided to the                            patient.                           - Resume previous diet.                           - Continue present medications.                           - Await pathology results. Mauri Pole, MD 08/21/2017 10:53:15 AM This report has been signed electronically.

## 2017-08-21 NOTE — Progress Notes (Signed)
Called to room to assist during endoscopic procedure.  Patient ID and intended procedure confirmed with present staff. Received instructions for my participation in the procedure from the performing physician.  

## 2017-08-22 ENCOUNTER — Telehealth: Payer: Self-pay | Admitting: *Deleted

## 2017-08-22 LAB — HELICOBACTER PYLORI SCREEN-BIOPSY: UREASE: NEGATIVE

## 2017-08-22 NOTE — Telephone Encounter (Signed)
Second attempt follow -up call.  Voicemail with name identifier.  Left message left to call if questions or concerns.

## 2017-08-22 NOTE — Telephone Encounter (Signed)
  Follow up Call-  Call back number 08/21/2017  Post procedure Call Back phone  # [phone]  Permission to leave phone message Yes  Some recent data might be hidden     Patient questions:  Message left to call us if necessary.

## 2017-09-03 ENCOUNTER — Encounter: Payer: Self-pay | Admitting: Podiatry

## 2017-09-11 NOTE — Telephone Encounter (Signed)
I called patient and left voice mail that if she is still having problems with her toe, she can call the office to be seen again.

## 2017-09-16 ENCOUNTER — Other Ambulatory Visit: Payer: Self-pay | Admitting: *Deleted

## 2017-09-16 ENCOUNTER — Ambulatory Visit: Payer: Self-pay | Admitting: General Surgery

## 2017-09-16 NOTE — Telephone Encounter (Signed)
Last office visit 02/18/2017 with Dr. Lorelei Pont for PNA.  Last seen for DM 10/11/2016.  Last refilled 11/22/2016 for #[id number] with 1 refill.  No future appointments scheduled.  Refill?

## 2017-09-16 NOTE — Telephone Encounter (Signed)
Please schedule a f/u and refill until then 

## 2017-09-16 NOTE — H&P (Signed)
History of Present Illness [name]; 5/68/6168 2:28 PM) The patient is a 57 year old female who presents with anal lesions. patient with rectal bleeding for several years. She was noted to have abnormal mucosa in her anal canal on colonoscopy. Biopsies showed high-grade AIN. She reports normal cervical screenings prior to hysterectomy 2004. She has never had any anal procedures in the past.   Past Surgical History Lisa Lorenzo, Brooks; 3/72/9021 1:15 PM) Foot Surgery Bilateral. Hysterectomy (not due to cancer) - Complete  Diagnostic Studies History Lisa Lorenzo, Brooks; 08/18/8020 3:36 PM) Colonoscopy within last year Mammogram 1-3 years ago  Allergies Lisa Lorenzo, Brooks; 04/23/4495 5:30 PM) Amoxicillin *PENICILLINS* ACE Inhibitors Cough.  Medication History Lisa Lorenzo, Brooks; 0/51/1021 1:17 PM) MetFORMIN HCl (500MG  Tablet, Oral) Active. Medications Reconciled  Social History Lisa Lorenzo, Brooks; 3/56/7014 1:03 PM) Alcohol use Occasional alcohol use. Caffeine use Carbonated beverages. No drug use Tobacco use Never smoker.  Family History Lisa Lorenzo, Brooks; 0/13/1438 8:87 PM) Arthritis Sister. Cerebrovascular Accident Sister. Colon Cancer Father. Diabetes Mellitus Sister. Hypertension Sister. Melanoma Father. Migraine Headache Sister. Rectal Cancer Father.  Pregnancy / Birth History Lisa Lorenzo, Brooks; 5/79/7282 0:60 PM) Age at menarche 59 years. Age of menopause 29-60     Review of Systems Lisa [name]; 1/56/1537 9:43 PM) General Present- Appetite Loss. Not Present- Chills, Fatigue, Fever, Night Sweats, Weight Gain and Weight Loss. Skin Not Present- Change in Wart/Mole, Dryness, Hives, Jaundice, New Lesions, Non-Healing Wounds, Rash and Ulcer. HEENT Present- Sinus Pain. Not Present- Earache, Hearing Loss, Hoarseness, Nose Bleed, Oral Ulcers, Ringing in the Ears, Seasonal Allergies, Sore Throat, Visual Disturbances, Wears  glasses/contact lenses and Yellow Eyes. Respiratory Not Present- Bloody sputum, Chronic Cough, Difficulty Breathing, Snoring and Wheezing. Breast Not Present- Breast Mass, Breast Pain, Nipple Discharge and Skin Changes. Cardiovascular Present- Leg Cramps. Not Present- Chest Pain, Difficulty Breathing Lying Down, Palpitations, Rapid Heart Rate, Shortness of Breath and Swelling of Extremities. Gastrointestinal Present- Bloating, Bloody Stool and Constipation. Not Present- Abdominal Pain, Change in Bowel Habits, Chronic diarrhea, Difficulty Swallowing, Excessive gas, Gets full quickly at meals, Hemorrhoids, Indigestion, Nausea, Rectal Pain and Vomiting.  Vitals Lisa [name]; 2/76/1470 9:29 PM) 09/16/2017 2:12 PM Weight: 197.6 lb Height: 63in Body Surface Area: 1.92 m Body Mass Index: 35 kg/m  Temp.: 98.74F(Oral)  Pulse: 83 (Regular)  BP: 126/88 (Sitting, Left Arm, Standard)      Physical Exam [name]; 5/74/7340 2:28 PM)  General Mental Status-Alert. General Appearance-Not in acute distress. Build & Nutrition-Well nourished. Posture-Normal posture. Gait-Normal.  Head and Neck Head-normocephalic, atraumatic with no lesions or palpable masses. Trachea-midline.  Chest and Lung Exam Chest and lung exam reveals -on auscultation, normal breath sounds, no adventitious sounds and normal vocal resonance.  Cardiovascular Cardiovascular examination reveals -normal heart sounds, regular rate and rhythm with no murmurs and no digital clubbing, cyanosis, edema, increased warmth or tenderness.  Abdomen Inspection Inspection of the abdomen reveals - No Hernias. Palpation/Percussion Palpation and Percussion of the abdomen reveal - Soft, Non Tender, No Rigidity (guarding), No hepatosplenomegaly and No Palpable abdominal masses.  Rectal Anorectal Exam External - normal external exam. Internal - Note: possible nodularity palpated in the right  anterior anal canal.  Neurologic Neurologic evaluation reveals -alert and oriented x 3 with no impairment of recent or remote memory, normal attention span and ability to concentrate, normal sensation and normal coordination.  Musculoskeletal Normal Exam - Bilateral-Upper Extremity Strength Normal and Lower Extremity Strength Normal.    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 On exam this appears to be in the right anterior anal canal. She also has grade 2 internal hemorrhoids in all 3 locations. I recommended that we start with a high-resolution anoscopy and possible biopsy versus resection depending on the extent of disease. Risk include bleeding pain and recurrence. I believe she understands this and wishes to proceed with surgery.

## 2017-09-16 NOTE — H&P (View-Only) (Signed)
History of Present Illness (Lisa Keeny MD; 09/16/2017 2:28 PM) The patient is a 57 year old female who presents with anal lesions. patient with rectal bleeding for several years. She was noted to have abnormal mucosa in her anal canal on colonoscopy. Biopsies showed high-grade AIN. She reports normal cervical screenings prior to hysterectomy 2004. She has never had any anal procedures in the past.   Past Surgical History (Kelly Dockery, [name]; 09/16/2017 2:11 PM) Foot Surgery Bilateral. Hysterectomy (not due to cancer) - Complete  Diagnostic Studies History (Kelly Dockery, [name]; 09/16/2017 2:11 PM) Colonoscopy within last year Mammogram 1-3 years ago  Allergies (Kelly Dockery, [name]; 09/16/2017 2:13 PM) Amoxicillin *PENICILLINS* ACE Inhibitors Cough.  Medication History (Kelly Dockery, [name]; 09/16/2017 2:13 PM) MetFORMIN HCl (500MG Tablet, Oral) Active. Medications Reconciled  Social History (Kelly Dockery, [name]; 09/16/2017 2:11 PM) Alcohol use Occasional alcohol use. Caffeine use Carbonated beverages. No drug use Tobacco use Never smoker.  Family History (Kelly Dockery, [name]; 09/16/2017 2:11 PM) Arthritis Sister. Cerebrovascular Accident Sister. Colon Cancer Father. Diabetes Mellitus Sister. Hypertension Sister. Melanoma Father. Migraine Headache Sister. Rectal Cancer Father.  Pregnancy / Birth History (Kelly Dockery, [name]; 09/16/2017 2:11 PM) Age at menarche 13 years. Age of menopause 56-60     Review of Systems (Kelly Dockery [name]; 09/16/2017 2:11 PM) General Present- Appetite Loss. Not Present- Chills, Fatigue, Fever, Night Sweats, Weight Gain and Weight Loss. Skin Not Present- Change in Wart/Mole, Dryness, Hives, Jaundice, New Lesions, Non-Healing Wounds, Rash and Ulcer. HEENT Present- Sinus Pain. Not Present- Earache, Hearing Loss, Hoarseness, Nose Bleed, Oral Ulcers, Ringing in the Ears, Seasonal Allergies, Sore Throat, Visual Disturbances, Wears  glasses/contact lenses and Yellow Eyes. Respiratory Not Present- Bloody sputum, Chronic Cough, Difficulty Breathing, Snoring and Wheezing. Breast Not Present- Breast Mass, Breast Pain, Nipple Discharge and Skin Changes. Cardiovascular Present- Leg Cramps. Not Present- Chest Pain, Difficulty Breathing Lying Down, Palpitations, Rapid Heart Rate, Shortness of Breath and Swelling of Extremities. Gastrointestinal Present- Bloating, Bloody Stool and Constipation. Not Present- Abdominal Pain, Change in Bowel Habits, Chronic diarrhea, Difficulty Swallowing, Excessive gas, Gets full quickly at meals, Hemorrhoids, Indigestion, Nausea, Rectal Pain and Vomiting.  Vitals (Kelly Dockery [name]; 09/16/2017 2:12 PM) 09/16/2017 2:12 PM Weight: 197.6 lb Height: 63in Body Surface Area: 1.92 m Body Mass Index: 35 kg/m  Temp.: 98.3F(Oral)  Pulse: 83 (Regular)  BP: 126/88 (Sitting, Left Arm, Standard)      Physical Exam (Milcah Dulany MD; 09/16/2017 2:28 PM)  General Mental Status-Alert. General Appearance-Not in acute distress. Build & Nutrition-Well nourished. Posture-Normal posture. Gait-Normal.  Head and Neck Head-normocephalic, atraumatic with no lesions or palpable masses. Trachea-midline.  Chest and Lung Exam Chest and lung exam reveals -on auscultation, normal breath sounds, no adventitious sounds and normal vocal resonance.  Cardiovascular Cardiovascular examination reveals -normal heart sounds, regular rate and rhythm with no murmurs and no digital clubbing, cyanosis, edema, increased warmth or tenderness.  Abdomen Inspection Inspection of the abdomen reveals - No Hernias. Palpation/Percussion Palpation and Percussion of the abdomen reveal - Soft, Non Tender, No Rigidity (guarding), No hepatosplenomegaly and No Palpable abdominal masses.  Rectal Anorectal Exam External - normal external exam. Internal - Note: possible nodularity palpated in the right  anterior anal canal.  Neurologic Neurologic evaluation reveals -alert and oriented x 3 with no impairment of recent or remote memory, normal attention span and ability to concentrate, normal sensation and normal coordination.  Musculoskeletal Normal Exam - Bilateral-Upper Extremity Strength Normal and Lower Extremity Strength Normal.    Assessment & Plan (  Zakyia Gagan MD; 09/16/2017 2:37 PM)  AIN GRADE II (K62.82) Impression: 57-year-old female who underwent recent colonoscopy for rectal bleeding and family history of colon cancer. 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 On exam this appears to be in the right anterior anal canal. She also has grade 2 internal hemorrhoids in all 3 locations. I recommended that we start with a high-resolution anoscopy and possible biopsy versus resection depending on the extent of disease. Risk include bleeding pain and recurrence. I believe she understands this and wishes to proceed with surgery. 

## 2017-09-17 NOTE — Telephone Encounter (Signed)
Lm on pts vm requesting a call back to schedule f/u/. Should pt contact office back, pls schedule and refill meds until that time. CRM created

## 2017-09-19 MED ORDER — METFORMIN HCL 500 MG PO TABS: 500.0000 mg | ORAL_TABLET | Freq: Two times a day (BID) | ORAL | 0 refills | Status: DC

## 2017-09-19 NOTE — Telephone Encounter (Signed)
Spoke with [name] about scheduling appointment with Dr. Glori Bickers to follow up on her diabetes.  She states currently she is waiting on colorectal surgery with [facility] because her anal canal biopsy from her colonoscopy was positive for high grade intra epithelial neoplasia so it is hard for her to miss work right now.  She thought it would be a few weeks before she could get in here to see Dr. Glori Bickers and she is completely out of her Metformin.  I advised I would go ahead and send in a month supply but to please call back and schedule an office visit with Dr. Glori Bickers as soon as she can to follow up on her diabetes.  Patient states understanding.

## 2017-10-05 ENCOUNTER — Encounter: Payer: Self-pay | Admitting: Gastroenterology

## 2017-10-06 ENCOUNTER — Encounter (HOSPITAL_BASED_OUTPATIENT_CLINIC_OR_DEPARTMENT_OTHER): Payer: Self-pay | Admitting: *Deleted

## 2017-10-06 ENCOUNTER — Other Ambulatory Visit: Payer: Self-pay

## 2017-10-06 NOTE — Progress Notes (Signed)
Spoke w/ pt via phone for pre-op interview.  Npo after mn.  Arrive at 0645.  Needs istat and ekg. 

## 2017-10-09 ENCOUNTER — Ambulatory Visit (HOSPITAL_BASED_OUTPATIENT_CLINIC_OR_DEPARTMENT_OTHER): Payer: Commercial Managed Care - PPO | Admitting: Anesthesiology

## 2017-10-09 ENCOUNTER — Encounter (HOSPITAL_BASED_OUTPATIENT_CLINIC_OR_DEPARTMENT_OTHER): Payer: Self-pay | Admitting: Anesthesiology

## 2017-10-09 ENCOUNTER — Ambulatory Visit (HOSPITAL_BASED_OUTPATIENT_CLINIC_OR_DEPARTMENT_OTHER)
Admission: RE | Admit: 2017-10-09 | Discharge: 2017-10-09 | Disposition: A | Discharge status: Home / Self Care | Payer: Commercial Managed Care - PPO | Source: Ambulatory Visit | Attending: General Surgery | Admitting: General Surgery

## 2017-10-09 ENCOUNTER — Other Ambulatory Visit: Payer: Self-pay

## 2017-10-09 ENCOUNTER — Encounter (HOSPITAL_BASED_OUTPATIENT_CLINIC_OR_DEPARTMENT_OTHER)
Admission: RE | Disposition: A | Discharge status: Home / Self Care | Payer: Self-pay | Source: Ambulatory Visit | Attending: General Surgery

## 2017-10-09 DIAGNOSIS — E119 Type 2 diabetes mellitus without complications: Secondary | ICD-10-CM | POA: Insufficient documentation

## 2017-10-09 DIAGNOSIS — K648 Other hemorrhoids: Secondary | ICD-10-CM | POA: Insufficient documentation

## 2017-10-09 DIAGNOSIS — K6282 Dysplasia of anus: Secondary | ICD-10-CM | POA: Diagnosis present

## 2017-10-09 DIAGNOSIS — Z7984 Long term (current) use of oral hypoglycemic drugs: Secondary | ICD-10-CM | POA: Insufficient documentation

## 2017-10-09 DIAGNOSIS — I10 Essential (primary) hypertension: Secondary | ICD-10-CM | POA: Diagnosis not present

## 2017-10-09 DIAGNOSIS — Z800 Family history of malignant neoplasm of digestive organs: Secondary | ICD-10-CM | POA: Insufficient documentation

## 2017-10-09 DIAGNOSIS — D013 Carcinoma in situ of anus and anal canal: Secondary | ICD-10-CM | POA: Insufficient documentation

## 2017-10-09 HISTORY — DX: Personal history of other benign neoplasm: Z86.018

## 2017-10-09 HISTORY — DX: Essential (primary) hypertension: I10

## 2017-10-09 HISTORY — PX: HIGH RESOLUTION ANOSCOPY: SHX6345

## 2017-10-09 HISTORY — DX: Type 2 diabetes mellitus without complications: E11.9

## 2017-10-09 HISTORY — DX: Dysplasia of anus: K62.82

## 2017-10-09 HISTORY — DX: Deficiency of other specified B group vitamins: E53.8

## 2017-10-09 HISTORY — DX: Iron deficiency anemia, unspecified: D50.9

## 2017-10-09 LAB — POCT I-STAT 4, (NA,K, GLUC, HGB,HCT)
Glucose, Bld: 140 mg/dL — ABNORMAL HIGH (ref 70–99)
HCT: 36 % (ref 36.0–46.0)
Hemoglobin: 12.2 g/dL (ref 12.0–15.0)
Potassium: 4.3 mmol/L (ref 3.5–5.1)
Sodium: 142 mmol/L (ref 135–145)

## 2017-10-09 LAB — GLUCOSE, CAPILLARY: Glucose-Capillary: 155 mg/dL — ABNORMAL HIGH (ref 70–99)

## 2017-10-09 SURGERY — ANOSCOPY, HIGH RESOLUTION
Anesthesia: Monitor Anesthesia Care

## 2017-10-09 MED ORDER — ACETAMINOPHEN 650 MG RE SUPP
650.0000 mg | RECTAL | Status: DC | PRN
  Filled 2017-10-09: qty 1

## 2017-10-09 MED ORDER — ACETAMINOPHEN 500 MG PO TABS
ORAL_TABLET | ORAL | Status: AC
  Filled 2017-10-09: qty 2

## 2017-10-09 MED ORDER — BUPIVACAINE-EPINEPHRINE 0.5% -1:200000 IJ SOLN
INTRAMUSCULAR | Status: DC | PRN
  Administered 2017-10-09: 30 mL

## 2017-10-09 MED ORDER — ACETAMINOPHEN 325 MG PO TABS
650.0000 mg | ORAL_TABLET | ORAL | Status: DC | PRN
  Filled 2017-10-09: qty 2

## 2017-10-09 MED ORDER — ACETAMINOPHEN 500 MG PO TABS
1000.0000 mg | ORAL_TABLET | ORAL | Status: AC
  Administered 2017-10-09: 1000 mg via ORAL
  Filled 2017-10-09: qty 2

## 2017-10-09 MED ORDER — PROPOFOL 10 MG/ML IV BOLUS
INTRAVENOUS | Status: AC
  Filled 2017-10-09: qty 20

## 2017-10-09 MED ORDER — OXYCODONE HCL 5 MG PO TABS
5.0000 mg | ORAL_TABLET | ORAL | Status: DC | PRN
  Filled 2017-10-09: qty 2

## 2017-10-09 MED ORDER — GABAPENTIN 300 MG PO CAPS
ORAL_CAPSULE | ORAL | Status: AC
  Filled 2017-10-09: qty 1

## 2017-10-09 MED ORDER — PROPOFOL 500 MG/50ML IV EMUL
INTRAVENOUS | Status: DC | PRN
  Administered 2017-10-09: 200 ug/kg/min via INTRAVENOUS

## 2017-10-09 MED ORDER — PROMETHAZINE HCL 25 MG/ML IJ SOLN
6.2500 mg | INTRAMUSCULAR | Status: DC | PRN
  Filled 2017-10-09: qty 1

## 2017-10-09 MED ORDER — CELECOXIB 200 MG PO CAPS
ORAL_CAPSULE | ORAL | Status: AC
  Filled 2017-10-09: qty 1

## 2017-10-09 MED ORDER — ONDANSETRON HCL 4 MG/2ML IJ SOLN
INTRAMUSCULAR | Status: AC
  Filled 2017-10-09: qty 2

## 2017-10-09 MED ORDER — LACTATED RINGERS IV SOLN
INTRAVENOUS | Status: DC
  Administered 2017-10-09: 08:00:00 via INTRAVENOUS
  Filled 2017-10-09: qty 1000

## 2017-10-09 MED ORDER — FENTANYL CITRATE (PF) 100 MCG/2ML IJ SOLN
25.0000 ug | INTRAMUSCULAR | Status: DC | PRN
  Filled 2017-10-09: qty 1

## 2017-10-09 MED ORDER — GABAPENTIN 300 MG PO CAPS
300.0000 mg | ORAL_CAPSULE | ORAL | Status: AC
  Administered 2017-10-09: 300 mg via ORAL
  Filled 2017-10-09: qty 1

## 2017-10-09 MED ORDER — HYDROCODONE-ACETAMINOPHEN 5-325 MG PO TABS: 1.0000 | ORAL_TABLET | Freq: Four times a day (QID) | ORAL | 0 refills | Status: DC | PRN

## 2017-10-09 MED ORDER — FENTANYL CITRATE (PF) 100 MCG/2ML IJ SOLN
INTRAMUSCULAR | Status: AC
  Filled 2017-10-09: qty 2

## 2017-10-09 MED ORDER — LIDOCAINE HCL (CARDIAC) PF 100 MG/5ML IV SOSY
PREFILLED_SYRINGE | INTRAVENOUS | Status: DC | PRN
  Administered 2017-10-09: 60 mg via INTRAVENOUS

## 2017-10-09 MED ORDER — LIDOCAINE 2% (20 MG/ML) 5 ML SYRINGE
INTRAMUSCULAR | Status: AC
  Filled 2017-10-09: qty 5

## 2017-10-09 MED ORDER — SODIUM CHLORIDE 0.9 % IV SOLN
250.0000 mL | INTRAVENOUS | Status: DC | PRN
  Filled 2017-10-09: qty 250

## 2017-10-09 MED ORDER — SODIUM CHLORIDE 0.9% FLUSH
3.0000 mL | INTRAVENOUS | Status: DC | PRN
  Filled 2017-10-09: qty 3

## 2017-10-09 MED ORDER — ONDANSETRON HCL 4 MG/2ML IJ SOLN
INTRAMUSCULAR | Status: DC | PRN
  Administered 2017-10-09: 4 mg via INTRAVENOUS

## 2017-10-09 MED ORDER — PROPOFOL 500 MG/50ML IV EMUL
INTRAVENOUS | Status: AC
  Filled 2017-10-09: qty 50

## 2017-10-09 MED ORDER — MIDAZOLAM HCL 2 MG/2ML IJ SOLN
INTRAMUSCULAR | Status: AC
  Filled 2017-10-09: qty 2

## 2017-10-09 MED ORDER — FENTANYL CITRATE (PF) 100 MCG/2ML IJ SOLN
INTRAMUSCULAR | Status: DC | PRN
  Administered 2017-10-09: 25 ug via INTRAVENOUS

## 2017-10-09 MED ORDER — MIDAZOLAM HCL 5 MG/5ML IJ SOLN
INTRAMUSCULAR | Status: DC | PRN
  Administered 2017-10-09: 2 mg via INTRAVENOUS

## 2017-10-09 MED ORDER — ACETIC ACID 5 % SOLN
Status: DC | PRN
  Administered 2017-10-09: 1 via TOPICAL

## 2017-10-09 MED ORDER — CELECOXIB 200 MG PO CAPS
200.0000 mg | ORAL_CAPSULE | ORAL | Status: AC
  Administered 2017-10-09: 200 mg via ORAL
  Filled 2017-10-09: qty 1

## 2017-10-09 MED ORDER — IODINE STRONG (LUGOLS) 5 % PO SOLN
ORAL | Status: DC | PRN
  Administered 2017-10-09: 1 mL via ORAL

## 2017-10-09 MED ORDER — SODIUM CHLORIDE 0.9% FLUSH
3.0000 mL | Freq: Two times a day (BID) | INTRAVENOUS | Status: DC
  Filled 2017-10-09: qty 3

## 2017-10-09 SURGICAL SUPPLY — 42 items
APL SKNCLS STERI-STRIP NONHPOA (GAUZE/BANDAGES/DRESSINGS) ×1
APL SWBSTK 6 STRL LF DISP (MISCELLANEOUS) ×1
APPLICATOR COTTON TIP 6 STRL (MISCELLANEOUS) IMPLANT
APPLICATOR COTTON TIP 6IN STRL (MISCELLANEOUS) ×3
BENZOIN TINCTURE PRP APPL 2/3 (GAUZE/BANDAGES/DRESSINGS) ×3 IMPLANT
BLADE EXTENDED COATED 6.5IN (ELECTRODE) IMPLANT
BLADE HEX COATED 2.75 (ELECTRODE) ×1 IMPLANT
BLADE SURG 15 STRL LF DISP TIS (BLADE) ×1 IMPLANT
BLADE SURG 15 STRL SS (BLADE) ×3
BRIEF STRETCH FOR OB PAD LRG (UNDERPADS AND DIAPERS) ×4 IMPLANT
CANISTER SUCT 3000ML PPV (MISCELLANEOUS) ×3 IMPLANT
COVER BACK TABLE 60X90IN (DRAPES) ×3 IMPLANT
DECANTER SPIKE VIAL GLASS SM (MISCELLANEOUS) ×1 IMPLANT
DRAPE LAPAROTOMY 100X72 PEDS (DRAPES) ×3 IMPLANT
DRAPE UTILITY XL STRL (DRAPES) ×3 IMPLANT
ELECT REM PT RETURN 9FT ADLT (ELECTROSURGICAL) ×3
ELECTRODE REM PT RTRN 9FT ADLT (ELECTROSURGICAL) ×1 IMPLANT
GAUZE SPONGE 4X4 12PLY STRL (GAUZE/BANDAGES/DRESSINGS) IMPLANT
GAUZE SPONGE 4X4 12PLY STRL LF (GAUZE/BANDAGES/DRESSINGS) ×2 IMPLANT
GAUZE SPONGE 4X4 16PLY XRAY LF (GAUZE/BANDAGES/DRESSINGS) IMPLANT
GLOVE BIO SURGEON STRL SZ 6.5 (GLOVE) ×2 IMPLANT
GLOVE BIO SURGEONS STRL SZ 6.5 (GLOVE) ×1
KIT TURNOVER CYSTO (KITS) ×3 IMPLANT
NEEDLE HYPO 22GX1.5 SAFETY (NEEDLE) ×3 IMPLANT
NS IRRIG 500ML POUR BTL (IV SOLUTION) ×3 IMPLANT
PACK BASIN DAY SURGERY FS (CUSTOM PROCEDURE TRAY) ×3 IMPLANT
PAD ABD 8X10 STRL (GAUZE/BANDAGES/DRESSINGS) ×2 IMPLANT
PAD ARMBOARD 7.5X6 YLW CONV (MISCELLANEOUS) ×2 IMPLANT
PENCIL BUTTON HOLSTER BLD 10FT (ELECTRODE) ×3 IMPLANT
SPONGE HEMORRHOID 8X3CM (HEMOSTASIS) ×2 IMPLANT
SPONGE SURGIFOAM ABS GEL 12-7 (HEMOSTASIS) IMPLANT
SUT CHROMIC 2 0 SH (SUTURE) IMPLANT
SUT CHROMIC 3 0 SH 27 (SUTURE) ×2 IMPLANT
SUT VIC AB 4-0 P-3 18XBRD (SUTURE) IMPLANT
SUT VIC AB 4-0 P3 18 (SUTURE)
SYR BULB IRRIGATION 50ML (SYRINGE) ×3 IMPLANT
SYR CONTROL 10ML LL (SYRINGE) ×3 IMPLANT
TOWEL OR 17X24 6PK STRL BLUE (TOWEL DISPOSABLE) ×3 IMPLANT
TRAY DSU PREP LF (CUSTOM PROCEDURE TRAY) ×4 IMPLANT
TUBE CONNECTING 12'X1/4 (SUCTIONS) ×1
TUBE CONNECTING 12X1/4 (SUCTIONS) ×2 IMPLANT
YANKAUER SUCT BULB TIP NO VENT (SUCTIONS) ×2 IMPLANT

## 2017-10-09 NOTE — Anesthesia Postprocedure Evaluation (Signed)
Anesthesia Post Note  Patient: [name]  Procedure(s) Performed: HIGH RESOLUTION ANOSCOPY,  BIOPSY  EXCISION (N/A )     Patient location during evaluation: PACU Anesthesia Type: MAC Level of consciousness: awake and alert Pain management: pain level controlled Vital Signs Assessment: post-procedure vital signs reviewed and stable Respiratory status: spontaneous breathing, nonlabored ventilation, respiratory function stable and patient connected to nasal cannula oxygen Cardiovascular status: stable and blood pressure returned to baseline Postop Assessment: no apparent nausea or vomiting Anesthetic complications: no    Last Vitals:  Vitals:   10/09/17 1000 10/09/17 1015  BP: (!) 147/99 (!) 161/93  Pulse: 70 71  Resp: 16 17  Temp:    SpO2: 98% 100%    Last Pain:  Vitals:   10/09/17 0950  TempSrc:   PainSc: 0-No pain                 Latricia Cerrito S

## 2017-10-09 NOTE — Transfer of Care (Signed)
  Last Vitals:  Vitals Value Taken Time  BP 147/91 10/09/2017  9:39 AM  Temp    Pulse 84 10/09/2017  9:42 AM  Resp 17 10/09/2017  9:42 AM  SpO2 100 % 10/09/2017  9:42 AM  Vitals shown include unvalidated device data.  Last Pain:  Vitals:   10/09/17 0805  TempSrc:   PainSc: 0-No pain      Patients Stated Pain Goal: 1 (10/09/17 0805)  Immediate Anesthesia Transfer of Care Note  Patient: [name]  Procedure(s) Performed: Procedure(s) (LRB): HIGH RESOLUTION ANOSCOPY,  BIOPSY  EXCISION (N/A)  Patient Location: PACU  Anesthesia Type: MAC  Level of Consciousness: awake, alert  and oriented  Airway & Oxygen Therapy: Patient Spontanous Breathing and Patient connected to nasal cannula oxygen  Post-op Assessment: Report given to PACU RN and Post -op Vital signs reviewed and stable  Post vital signs: Reviewed and stable  Complications: No apparent anesthesia complications

## 2017-10-09 NOTE — Op Note (Signed)
10/09/2017  9:33 AM  PATIENT:  [name]  57 y.o. female  Patient Care Team: Tower, Wynelle Fanny, MD as PCP - General  PRE-OPERATIVE DIAGNOSIS:  AIN II  POST-OPERATIVE DIAGNOSIS:  AIN II  PROCEDURE:  HIGH RESOLUTION ANOSCOPY with BIOPSY     Surgeon(s): Leighton Ruff, MD  ASSISTANT: none   ANESTHESIA:   local and MAC  EBL: 69m  No intake/output data recorded.  SPECIMEN:  Source of Specimen:  anterior, L lateral, posterior and R lateral anal canal  DISPOSITION OF SPECIMEN:  PATHOLOGY  COUNTS:  YES  PLAN OF CARE: Discharge to home after PACU  PATIENT DISPOSITION:  PACU - hemodynamically stable.  INDICATION: 57y.o. F with AIN II found on biopsy.  I recommended a HRA to evaluate her risk  OR FINDINGS: significant staining throughout the dentate line circumferentially  DESCRIPTION: The patient was identified in the preoperative holding area and taken to the OR where they were laid prone on the operating room table in jacknife position. MAC anesthesia was smoothly induced.  The patient was then prepped and draped in the usual sterile fashion. A surgical timeout was performed indicating the correct patient, procedure, positioning and preoperative antibioitics. SCDs were noted to be in place and functioning prior to the operation.   After this was completed, a sponge was soaked in 5% acetic acid was placed over the perianal region. This was allowed to soak for 2 minutes. The sponge was removed and the perianal region was evaluated with a colposcope .  There were no lesions noted externally.  The internal anal canal was evaluated via anoscopy with a Hill-Ferguson anoscope.  There were multiple lesions noted almost circumferentially around the level of the dentate line.  A biopsy was taken with Metzenbaum scissors in all 4 quadrants for a representative sample.  These were sent to pathology for further examination.  The biopsy sites were then closed with 3-0 chromic suture.   There was a raised lesion in the right lateral anal canal.  This was mostly ablated using electrocautery.  A roll of Gelfoam was placed into the anal canal for added hemostasis.  A dressing was applied.  The patient was then awakened from anesthesia and sent to the postanesthesia care unit in stable condition.  All counts were correct per operating room staff.  [facility] for this patient

## 2017-10-09 NOTE — Anesthesia Procedure Notes (Signed)
Procedure Name: MAC Date/Time: 10/09/2017 8:58 AM Performed by: Myrtie Soman, MD Pre-anesthesia Checklist: Patient identified, Emergency Drugs available, Suction available, Patient being monitored and Timeout performed Oxygen Delivery Method: Nasal cannula Placement Confirmation: positive ETCO2,  CO2 detector and breath sounds checked- equal and bilateral

## 2017-10-09 NOTE — Discharge Instructions (Addendum)

## 2017-10-09 NOTE — Interval H&P Note (Signed)
History and Physical Interval Note:  10/09/2017 7:08 AM  [name]  has presented today for surgery, with the diagnosis of AIN II  The various methods of treatment have been discussed with the patient and family. After consideration of risks, benefits and other options for treatment, the patient has consented to  Procedure(s): HIGH RESOLUTION ANOSCOPY, POSSIBLE BIOPSY OR EXCISION (N/A) as a surgical intervention .  The patient's history has been reviewed, patient examined, no change in status, stable for surgery.  I have reviewed the patient's chart and labs.  Questions were answered to the patient's satisfaction.    Rosario Adie, MD  [facility]

## 2017-10-09 NOTE — Anesthesia Preprocedure Evaluation (Signed)
Anesthesia Evaluation    Airway Mallampati: II  TM Distance: >3 FB Neck ROM: Full    Dental no notable dental hx.    Pulmonary    Pulmonary exam normal breath sounds clear to auscultation       Cardiovascular hypertension, Normal cardiovascular exam Rhythm:Regular Rate:Normal     Neuro/Psych    GI/Hepatic   Endo/Other  diabetes  Renal/GU      Musculoskeletal   Abdominal   Peds  Hematology   Anesthesia Other Findings   Reproductive/Obstetrics                             Anesthesia Physical Anesthesia Plan  ASA: III  Anesthesia Plan: MAC   Post-op Pain Management:    Induction: Intravenous  PONV Risk Score and Plan: 0  Airway Management Planned: Simple Face Mask  Additional Equipment:   Intra-op Plan:   Post-operative Plan:   Informed Consent: I have reviewed the patients History and Physical, chart, labs and discussed the procedure including the risks, benefits and alternatives for the proposed anesthesia with the patient or authorized representative who has indicated his/her understanding and acceptance.   Dental advisory given  Plan Discussed with: CRNA and Surgeon  Anesthesia Plan Comments:         Anesthesia Quick Evaluation

## 2017-10-10 ENCOUNTER — Encounter (HOSPITAL_BASED_OUTPATIENT_CLINIC_OR_DEPARTMENT_OTHER): Payer: Self-pay | Admitting: General Surgery

## 2017-10-18 ENCOUNTER — Other Ambulatory Visit: Payer: Self-pay

## 2017-10-18 ENCOUNTER — Emergency Department (HOSPITAL_COMMUNITY)
Admission: EM | Admit: 2017-10-18 | Discharge: 2017-10-18 | Disposition: A | Discharge status: Home / Self Care | Payer: Commercial Managed Care - PPO | Attending: Emergency Medicine | Admitting: Emergency Medicine

## 2017-10-18 ENCOUNTER — Emergency Department (HOSPITAL_COMMUNITY): Payer: Commercial Managed Care - PPO

## 2017-10-18 ENCOUNTER — Encounter (HOSPITAL_COMMUNITY): Payer: Self-pay

## 2017-10-18 DIAGNOSIS — E119 Type 2 diabetes mellitus without complications: Secondary | ICD-10-CM | POA: Diagnosis not present

## 2017-10-18 DIAGNOSIS — I10 Essential (primary) hypertension: Secondary | ICD-10-CM | POA: Diagnosis not present

## 2017-10-18 DIAGNOSIS — R102 Pelvic and perineal pain: Secondary | ICD-10-CM | POA: Diagnosis not present

## 2017-10-18 DIAGNOSIS — K625 Hemorrhage of anus and rectum: Secondary | ICD-10-CM | POA: Insufficient documentation

## 2017-10-18 DIAGNOSIS — Z7984 Long term (current) use of oral hypoglycemic drugs: Secondary | ICD-10-CM | POA: Insufficient documentation

## 2017-10-18 DIAGNOSIS — R1030 Lower abdominal pain, unspecified: Secondary | ICD-10-CM | POA: Insufficient documentation

## 2017-10-18 LAB — COMPREHENSIVE METABOLIC PANEL
ALT: 19 U/L (ref 0–44)
AST: 20 U/L (ref 15–41)
Albumin: 3.8 g/dL (ref 3.5–5.0)
Alkaline Phosphatase: 59 U/L (ref 38–126)
Anion gap: 6 (ref 5–15)
BUN: 10 mg/dL (ref 6–20)
CO2: 26 mmol/L (ref 22–32)
Calcium: 8.6 mg/dL — ABNORMAL LOW (ref 8.9–10.3)
Chloride: 109 mmol/L (ref 98–111)
Creatinine, Ser: 0.6 mg/dL (ref 0.44–1.00)
GFR calc Af Amer: >60 mL/min (ref >60)
Glucose, Bld: 105 mg/dL — ABNORMAL HIGH (ref 70–99)
Potassium: 3.7 mmol/L (ref 3.5–5.1)
Sodium: 141 mmol/L (ref 135–145)
Total Bilirubin: 0.4 mg/dL (ref 0.3–1.2)
Total Protein: 7 g/dL (ref 6.5–8.1)

## 2017-10-18 LAB — POC OCCULT BLOOD, ED: Fecal Occult Bld: POSITIVE — AB

## 2017-10-18 LAB — URINALYSIS, ROUTINE W REFLEX MICROSCOPIC
Bilirubin Urine: NEGATIVE
Glucose, UA: NEGATIVE mg/dL
Hgb urine dipstick: NEGATIVE
Ketones, ur: 5 mg/dL — AB
Leukocytes, UA: NEGATIVE
Nitrite: NEGATIVE
Protein, ur: NEGATIVE mg/dL
Specific Gravity, Urine: 1.019 (ref 1.005–1.030)
pH: 5 (ref 5.0–8.0)

## 2017-10-18 LAB — TYPE AND SCREEN
ABO/RH(D): O NEG
Antibody Screen: NEGATIVE

## 2017-10-18 LAB — I-STAT CHEM 8, ED
BUN: 8 mg/dL (ref 6–20)
Calcium, Ion: 1.16 mmol/L (ref 1.15–1.40)
Chloride: 107 mmol/L (ref 98–111)
Creatinine, Ser: 0.6 mg/dL (ref 0.44–1.00)
Glucose, Bld: 103 mg/dL — ABNORMAL HIGH (ref 70–99)
HCT: 32 % — ABNORMAL LOW (ref 36.0–46.0)
HEMOGLOBIN: 10.9 g/dL — AB (ref 12.0–15.0)
Potassium: 3.6 mmol/L (ref 3.5–5.1)
SODIUM: 142 mmol/L (ref 135–145)
TCO2: 24 mmol/L (ref 22–32)

## 2017-10-18 LAB — CBC
HEMATOCRIT: 34.9 % — AB (ref 36.0–46.0)
Hemoglobin: 10.9 g/dL — ABNORMAL LOW (ref 12.0–15.0)
MCH: 24.5 pg — ABNORMAL LOW (ref 26.0–34.0)
MCHC: 31.2 g/dL (ref 30.0–36.0)
MCV: 78.6 fL (ref 78.0–100.0)
Platelets: 152 10*3/uL (ref 150–400)
RBC: 4.44 MIL/uL (ref 3.87–5.11)
RDW: 16.8 % — ABNORMAL HIGH (ref 11.5–15.5)
WBC: 5 10*3/uL (ref 4.0–10.5)

## 2017-10-18 LAB — I-STAT BETA HCG BLOOD, ED (MC, WL, AP ONLY): I-stat hCG, quantitative: <5 m[IU]/mL (ref <5)

## 2017-10-18 LAB — ABO/RH: ABO/RH(D): O NEG

## 2017-10-18 MED ORDER — ONDANSETRON HCL 4 MG PO TABS: 4.0000 mg | ORAL_TABLET | Freq: Three times a day (TID) | ORAL | 0 refills | Status: DC | PRN

## 2017-10-18 MED ORDER — IOPAMIDOL (ISOVUE-300) INJECTION 61%
100.0000 mL | Freq: Once | INTRAVENOUS | Status: AC | PRN
  Administered 2017-10-18: 100 mL via INTRAVENOUS

## 2017-10-18 MED ORDER — IOPAMIDOL (ISOVUE-300) INJECTION 61%
INTRAVENOUS | Status: AC
  Filled 2017-10-18: qty 100

## 2017-10-18 MED ORDER — FENTANYL CITRATE (PF) 100 MCG/2ML IJ SOLN
50.0000 ug | Freq: Once | INTRAMUSCULAR | Status: AC
  Administered 2017-10-18: 50 ug via INTRAVENOUS
  Filled 2017-10-18: qty 2

## 2017-10-18 MED ORDER — SODIUM CHLORIDE 0.9 % IV BOLUS
1000.0000 mL | Freq: Once | INTRAVENOUS | Status: AC
  Administered 2017-10-18: 1000 mL via INTRAVENOUS

## 2017-10-18 MED ORDER — ONDANSETRON HCL 4 MG/2ML IJ SOLN
4.0000 mg | Freq: Once | INTRAMUSCULAR | Status: AC
  Administered 2017-10-18: 4 mg via INTRAVENOUS
  Filled 2017-10-18: qty 2

## 2017-10-18 MED ORDER — MORPHINE SULFATE (PF) 4 MG/ML IV SOLN
4.0000 mg | Freq: Once | INTRAVENOUS | Status: AC
  Administered 2017-10-18: 4 mg via INTRAVENOUS
  Filled 2017-10-18: qty 1

## 2017-10-18 NOTE — ED Provider Notes (Signed)
[facility] DEPT Provider Note   CSN: [id number] Arrival date & time: 10/18/17  1033     History   Chief Complaint Chief Complaint  Patient presents with  . Rectal Bleeding    HPI [name] is a 57 y.o. female with a history of hypertension, anemia who presents emergency department today for rectal bleeding.  Patient reports that she underwent a colonoscopy by Dr. Raliegh Ip approximate 1 week ago.  She reports that this did show some internal hemorrhoids but she has not had banding done for this.  She reports that last week on 7/11, Dr. Marcello Moores took her for biopsy.  On her operation note it shows that she was evaluated with an anoscopy and showed multiple lesions circumferentially around the level of the dentate line and a biopsy was done in all 4 quadrants, followed by closure with sutures.  Patient reports since that time she has had ongoing bleeding including bright red bloody bowel movements per day. She reports yesterday she started having diffuse lower abdominal pain that she describes as a cramping sensation like she is going to have the start of a period (patient has had a hysterectomy).  She reports the pain is constant, dull/achy with occasional sharp characteristic and rates as a 8/10.  She reports associated nausea without emesis.  She notes she is taking over-the-counter medication for symptoms without relief.  She reports she is prescribed hydrocodone but did not want to take this as a causes constipation.  Patient denies any fever, chills, chest pain, shortness of breath, cough, hemoptysis, upper abdominal pain, anticoagulation use, urinary symptoms.  She denies prior abdominal surgeries.  On chart review it appears patient was recommended by GI to have a CT scan to evaluate her chronic left upper quadrant abdominal pain.  HPI  Past Medical History:  Diagnosis Date  . Anal intraepithelial neoplasia II (AIN II)   . Arthritis    shoulder, feet  .  B12 deficiency   . Essential hypertension    followed by pcp--- currently no meds,  states when she stopped sinus medication otc , bp better  . High frequency hearing loss   . History of abnormal Pap smear   . History of uterine leiomyoma   . Hx of migraine headaches   . Iron deficiency anemia   . Type 2 diabetes mellitus ([place])    followed by pcp    Patient Active Problem List   Diagnosis Date Noted  . Essential hypertension 10/11/2016  . Screening mammogram, encounter for 08/30/2016  . Routine general medical examination at a health care facility 08/30/2016  . Encounter for screening for HIV 08/30/2016  . Need for hepatitis C screening test 08/30/2016  . History of herpes zoster 05/12/2015  . Hyperlipidemia with target low density lipoprotein (LDL) cholesterol less than 100 mg/dL 12/21/2012  . Obesity 12/21/2012  . Vaginal atrophy 12/18/2012  . Dyspepsia 08/20/2012  . Rectal bleeding 12/04/2011  . Lower back pain 12/04/2011  . ELEVATED BLOOD PRESSURE 06/04/2010  . Diseases of lips 04/10/2010  . ANEMIA, B12 DEFICIENCY 10/05/2008  . THROMBOCYTOPENIA 10/05/2008  . Allergic rhinitis 10/05/2008  . Chronic headache 10/05/2008  . FREQUENCY, URINARY 10/05/2008  . Type 2 diabetes mellitus without complications ([place]) 43/15/4008  . ANEMIA, IRON DEFICIENCY 01/26/2008    Past Surgical History:  Procedure Laterality Date  . BUNIONECTOMY Bilateral 1982  . CARDIOVASCULAR STRESS TEST  09-19-2010   @ [place]   normal nuclear study w/ no evidence ishemia/  normal LV funciton and wall motion , ef 81%  . COLONOSCOPY WITH ESOPHAGOGASTRODUODENOSCOPY (EGD)  last one 08-21-2017  . HIGH RESOLUTION ANOSCOPY N/A 10/09/2017   Procedure: HIGH RESOLUTION ANOSCOPY,  BIOPSY  EXCISION;  Surgeon: Leighton Ruff, MD;  Location: [facility];  Service: General;  Laterality: N/A;  . LAPAROSCOPIC ASSISTED VAGINAL HYSTERECTOMY  09-18-2005    dr Earvin Hansen  . LEEP    . MYOMECTOMY ABDOMINAL APPROACH   09-04-2001    dr [facility]   w/ lysis adhesions  . NASAL ENDOSCOPY  03/2007   neg  . TONSILLECTOMY  child     OB History    Gravida  0   Para  0   Term  0   Preterm  0   AB  0   Living  0     SAB  0   TAB  0   Ectopic  0   Multiple  0   Live Births               Home Medications    Prior to Admission medications   Medication Sig Start Date End Date Taking? Authorizing Provider  acetaminophen (TYLENOL) 500 MG tablet Take 500 mg by mouth every 6 (six) hours as needed.    [provider]  HYDROcodone-acetaminophen (NORCO/VICODIN) 5-325 MG tablet Take 1-2 tablets by mouth every 6 (six) hours as needed. 1/32/44   Leighton Ruff, MD  metFORMIN (GLUCOPHAGE) 500 MG tablet Take 1 tablet (500 mg total) by mouth 2 (two) times daily with a meal. Patient taking differently: Take 500 mg by mouth 2 (two) times daily with a meal.  09/19/17   Tower, Wynelle Fanny, MD    Family History Family History  Problem Relation Age of Onset  . Heart disease Sister   . Arthritis Sister   . Diabetes Sister   . Hypertension Sister   . Diabetes Maternal Grandmother   . Hypertension Maternal Grandmother   . Lung cancer Father   . Colon cancer Father   . Breast cancer Neg Hx   . Esophageal cancer Neg Hx   . Stomach cancer Neg Hx   . Rectal cancer Neg Hx     Social History Social History   Tobacco Use  . Smoking status: Never Smoker  . Smokeless tobacco: Never Used  Substance Use Topics  . Alcohol use: Yes    Comment: occasional wine  . Drug use: No     Allergies   Lisinopril and Amoxicillin-pot clavulanate   Review of Systems Review of Systems  All other systems reviewed and are negative.    Physical Exam Updated Vital Signs BP (!) 148/76 (BP Location: Right Arm)   Pulse 80   Temp 98.2 F (36.8 C) (Oral)   Resp 18   Ht 5\' 3"  (1.6 m)   Wt 88.5 kg (195 lb)   SpO2 100%   BMI 34.54 kg/m   Physical Exam  Constitutional: She appears well-developed and  well-nourished.  HENT:  Head: Normocephalic and atraumatic.  Right Ear: External ear normal.  Left Ear: External ear normal.  Nose: Nose normal.  Mouth/Throat: Uvula is midline, oropharynx is clear and moist and mucous membranes are normal. No tonsillar exudate.  Eyes: Pupils are equal, round, and reactive to light. Right eye exhibits no discharge. Left eye exhibits no discharge. No scleral icterus.  Neck: Trachea normal. Neck supple. No spinous process tenderness present. No neck rigidity. Normal range of motion present.  Cardiovascular:  Normal rate, regular rhythm and intact distal pulses.  No murmur heard. Pulses:      Radial pulses are 2+ on the right side, and 2+ on the left side.       Dorsalis pedis pulses are 2+ on the right side, and 2+ on the left side.       Posterior tibial pulses are 2+ on the right side, and 2+ on the left side.  No lower extremity swelling or edema. Calves symmetric in size bilaterally.  Pulmonary/Chest: Effort normal and breath sounds normal. She exhibits no tenderness.  Abdominal: Soft. Bowel sounds are normal. There is tenderness in the right lower quadrant, suprapubic area and left lower quadrant. There is no rigidity, no rebound, no guarding and no CVA tenderness.  Genitourinary:  Genitourinary Comments: Chaperone was present.  Patient without pain around the rectal area. Perianal sensory intact. No external fissure's palpated or examined. External hemorrhoids noted without evidence of thrombosis or bleeding. No induration of the skin or swelling. Digital Rectal Exam reveals sphincter with good tone. No masses palpated. Stool color is scant with mainly mucous and a small amount of blood noted.  Musculoskeletal: She exhibits no edema.  Lymphadenopathy:    She has no cervical adenopathy.  Neurological: She is alert.  Skin: Skin is warm and dry. No rash noted. She is not diaphoretic.  Psychiatric: She has a normal mood and affect.  Nursing note and vitals  reviewed.    ED Treatments / Results  Labs (all labs ordered are listed, but only abnormal results are displayed) Labs Reviewed  COMPREHENSIVE METABOLIC PANEL - Abnormal; Notable for the following components:      Result Value   Glucose, Bld 105 (*)    Calcium 8.6 (*)    All other components within normal limits  CBC - Abnormal; Notable for the following components:   Hemoglobin 10.9 (*)    HCT 34.9 (*)    MCH 24.5 (*)    RDW 16.8 (*)    All other components within normal limits  URINALYSIS, ROUTINE W REFLEX MICROSCOPIC - Abnormal; Notable for the following components:   Ketones, ur 5 (*)    All other components within normal limits  POC OCCULT BLOOD, ED - Abnormal; Notable for the following components:   Fecal Occult Bld POSITIVE (*)    All other components within normal limits  I-STAT CHEM 8, ED - Abnormal; Notable for the following components:   Glucose, Bld 103 (*)    Hemoglobin 10.9 (*)    HCT 32.0 (*)    All other components within normal limits  I-STAT BETA HCG BLOOD, ED ([facility], [facility], [facility] ONLY)  TYPE AND SCREEN  ABO/RH    EKG None  Radiology Ct Abdomen Pelvis W Contrast  Result Date: 10/18/2017 CLINICAL DATA:  Abdominal pain. Red blood per rectum. Patient reports recent colonoscopy with removal of a lesion EXAM: CT ABDOMEN AND PELVIS WITH CONTRAST TECHNIQUE: Multidetector CT imaging of the abdomen and pelvis was performed using the standard protocol following bolus administration of intravenous contrast. CONTRAST:  100 mL ISOVUE-300 IOPAMIDOL (ISOVUE-300) INJECTION 61% COMPARISON:  CT pelvis Aug 23, 2011 FINDINGS: Lower chest: There is bibasilar atelectatic change. There is no lung base edema or consolidation. Hepatobiliary: There is fatty infiltration near the fissure for the ligamentum teres. No focal liver lesions are evident. Gallbladder wall is not appreciably thickened. There is no biliary duct dilatation. Pancreas: There is no pancreatic mass or inflammatory focus.  Spleen: No splenic lesions are  evident. Adrenals/Urinary Tract: Adrenals appear normal bilaterally. Kidneys bilaterally show no evident mass or hydronephrosis on either side. There is no evident renal or ureteral calculus on either side. Urinary bladder is midline with wall thickness within normal limits. A small urachal remnant is noted without mass or inflammatory focus. Stomach/Bowel: There is no appreciable bowel wall or mesenteric thickening. No evident bowel obstruction. No free air or portal venous air. Vascular/Lymphatic: There is no abdominal aortic aneurysm. No vascular lesions are appreciable. No adenopathy is evident in the abdomen or pelvis. Reproductive: Uterus is absent. No pelvic masses evident. There is a small amount of fluid in the cul-de-sac region. Other: Appendix appears normal. No evident abscess in the abdomen or pelvis. There is no ascites beyond small amount of fluid in the dependent portion of the pelvis. There is a small ventral hernia containing only fat. Musculoskeletal: There is degenerative change in the lumbar spine. There are no blastic or lytic bone lesions. There is no intramuscular or abdominal wall lesion evident. IMPRESSION: 1. Small amount of free fluid in the dependent portion of the pelvis of uncertain etiology. 2. No bowel wall thickening or bowel obstruction. No abscess in the abdomen or pelvis. Appendix appears normal. 3.  No evident renal or ureteral calculus.  No hydronephrosis. 4.  Small ventral hernia containing only fat. Electronically Signed   By: Lowella Grip III M.D.   On: 10/18/2017 15:25    Procedures Procedures (including critical care time)  Medications Ordered in ED Medications  iopamidol (ISOVUE-300) 61 % injection 100 mL (has no administration in time range)  morphine 4 MG/ML injection 4 mg (4 mg Intravenous Given 10/18/17 1209)  sodium chloride 0.9 % bolus 1,000 mL (0 mLs Intravenous Stopped 10/18/17 1358)  ondansetron (ZOFRAN) injection 4 mg  (4 mg Intravenous Given 10/18/17 1209)  fentaNYL (SUBLIMAZE) injection 50 mcg (50 mcg Intravenous Given 10/18/17 1358)     Initial Impression / Assessment and Plan / ED Course  I have reviewed the triage vital signs and the nursing notes.  Pertinent labs & imaging results that were available during my care of the patient were reviewed by me and considered in my medical decision making (see chart for details).     57 year old female presenting with rectal bleeding.  Patient reports that she underwent a colonoscopy by Dr. Raliegh Ip approximate 1 week ago.  She reports that this did show some internal hemorrhoids but she has not had banding done for this.  She reports that last week on 7/11, Dr. Marcello Moores took her for biopsy.  On her operation note it shows that she was evaluated with an anoscopy and showed multiple lesions circumferentially around the level of the dentate line and a biopsy was done in all 4 quadrants, followed by closure with sutures.  Patient reports since that time she has had ongoing bleeding including bright red bloody bowel movements per day. She reports yesterday she started having diffuse lower abdominal pain.  Patient does have pain medication at home.  Has some nausea without any emesis.  She is not anticoagulated.  Patient vital signs are reassuring on presentation.  Patient is without fever, tachycardia, tachypnea, hypoxia or significant hypotension (148/76).  Patient's abdominal exam is with diffuse tenderness to lower abdomen.  No focal tenderness on right or left.  No peritoneal signs.  Abdomen is nondistended.  DRE with blood on exam.  There is external hemorrhage without thrombosis.  Will assess with lab work, and CT scan.  Given IV fluids, nausea  medication pain medication.  Patient without leukocytosis.  Patient has drop in Hgb from 12.2 to 10.9 from 7/11 2 today.  No significant electrolyte derangements.  No acute kidney injury.  LFTs within normal limits.  No anion gap acidosis.   UA without evidence of UTI.  Pregnancy test negative.  Pain controlled at this time. With CT scan pending, case signed out to [facility] with disposition pending. If negative, plan to send the patient home with follow up with GI.  Marland Kitchen CT resulted prior to shift change.  This shows a small amount of free fluid in the dependent portion of the pelvis with unclear etiology.  There is no evidence of bowel thickening or bowel obstruction.  There was no evidence of abscess or appendicitis.  Discussed results with Dr. Hilarie Fredrickson who reviewed patient's chart.  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 Marcello Moores done on the 11th.  He recommends follow-up with surgery but does not feel there is any need to keep patient at this time.  Case was discussed with Dr. Rex Kras who is in agreement with plan.  Will plan to refer patient to [facility] surgery for further evaluation.  Surgical pathology reviewed appears there is no invasive carcinoma found of biopsies.  Patient's vital signs remained stable in the department.  The evaluation does not show pathology that would require ongoing emergent intervention or inpatient treatment. I advised the patient to follow-up with [facility] on Monday or Tuesday of this week. I advised the patient to return to the emergency department with new or worsening symptoms or new concerns. Specific return precautions discussed. The patient verbalized understanding and agreement with plan. All questions answered. No further questions at this time. The patient is hemodynamically stable, mentating appropriately and appears safe for discharge.  Final Clinical Impressions(s) / ED Diagnoses   Final diagnoses:  Lower abdominal pain  Rectal bleeding    ED Discharge Orders        Ordered    ondansetron (ZOFRAN) 4 MG tablet  Every 8 hours PRN     10/18/17 1613       Jillyn Ledger,  PA-C 10/18/17 1622    Little, Wenda Overland, MD 10/19/17 0700

## 2017-10-18 NOTE — ED Notes (Signed)
Made Michael PA aware that Mrphine didn't help with pain.

## 2017-10-18 NOTE — Discharge Instructions (Addendum)
You were seen here today for lower abdominal pain as well as rectal bleeding.  Your lab work and imaging were reassuring.  Your blood work did show a minimal drop in your hemoglobin but did not require transfusion.  Your imaging showed some non-specific free fluid in your abdomen without reason for your pain I spoke with your gastroenterologist who would like you to follow up with Dr. Marcello Moores on Monday.  Please take zofran as needed for nausea and vomting.  You can take your previously prescribed Hydrocodone for pain. Do not drive while taking this medication.   Get help right away if: You have new or increased rectal bleeding. You have black or dark red stools. You vomit blood or something that looks like coffee grounds. You have increased pain or tenderness in your abdomen. You have a fever. You feel weak. You have continued vomiting despite zofran You faint or pass out You have severe pain in your rectum. You cannot have a bowel movement.

## 2017-10-18 NOTE — ED Triage Notes (Signed)
Pt states 9 days ago, she had a colonoscopy. Pt states since then she has been having pain and bleeding since then.  Pt states she has been having blood with every bowel movement, usually frank red. Pt states that they found a mass during the colonoscopy, and they removed it during the procedure.

## 2017-10-23 ENCOUNTER — Other Ambulatory Visit: Payer: Self-pay | Admitting: *Deleted

## 2017-10-23 MED ORDER — METFORMIN HCL 500 MG PO TABS: 500.0000 mg | ORAL_TABLET | Freq: Two times a day (BID) | ORAL | 3 refills | Status: DC

## 2017-10-23 NOTE — Telephone Encounter (Signed)
Pt was recently diagnosed with cancer, but no recent or future appts to address DM, please see notes on refill from last month. Please advise

## 2017-12-04 ENCOUNTER — Other Ambulatory Visit: Payer: Self-pay

## 2017-12-04 MED ORDER — FLUTICASONE PROPIONATE 50 MCG/ACT NA SUSP: 2.0000 | Freq: Every day | NASAL | 6 refills | Status: DC

## 2017-12-19 ENCOUNTER — Encounter: Payer: Self-pay | Admitting: Family Medicine

## 2017-12-19 ENCOUNTER — Ambulatory Visit: Payer: Commercial Managed Care - PPO | Admitting: Family Medicine

## 2017-12-19 VITALS — BP 158/96 | HR 62 | Temp 98.4°F | Ht 63.0 in | Wt 189.8 lb

## 2017-12-19 DIAGNOSIS — I10 Essential (primary) hypertension: Secondary | ICD-10-CM

## 2017-12-19 DIAGNOSIS — E119 Type 2 diabetes mellitus without complications: Secondary | ICD-10-CM

## 2017-12-19 DIAGNOSIS — Z6833 Body mass index (BMI) 33.0-33.9, adult: Secondary | ICD-10-CM

## 2017-12-19 DIAGNOSIS — D013 Carcinoma in situ of anus and anal canal: Secondary | ICD-10-CM

## 2017-12-19 DIAGNOSIS — D518 Other vitamin B12 deficiency anemias: Secondary | ICD-10-CM

## 2017-12-19 DIAGNOSIS — E785 Hyperlipidemia, unspecified: Secondary | ICD-10-CM | POA: Diagnosis not present

## 2017-12-19 DIAGNOSIS — E6609 Other obesity due to excess calories: Secondary | ICD-10-CM

## 2017-12-19 DIAGNOSIS — D696 Thrombocytopenia, unspecified: Secondary | ICD-10-CM

## 2017-12-19 DIAGNOSIS — K6282 Dysplasia of anus: Secondary | ICD-10-CM

## 2017-12-19 LAB — CBC WITH DIFFERENTIAL/PLATELET
BASOS PCT: 0.5 % (ref 0.0–3.0)
Basophils Absolute: 0 10*3/uL (ref 0.0–0.1)
EOS PCT: 1.6 % (ref 0.0–5.0)
Eosinophils Absolute: 0.1 10*3/uL (ref 0.0–0.7)
HEMATOCRIT: 35.9 % — AB (ref 36.0–46.0)
HEMOGLOBIN: 11.4 g/dL — AB (ref 12.0–15.0)
LYMPHS PCT: 38.2 % (ref 12.0–46.0)
Lymphs Abs: 1.3 10*3/uL (ref 0.7–4.0)
MCHC: 31.8 g/dL (ref 30.0–36.0)
MCV: 77.4 fl — ABNORMAL LOW (ref 78.0–100.0)
MONO ABS: 0.3 10*3/uL (ref 0.1–1.0)
Monocytes Relative: 9.5 % (ref 3.0–12.0)
NEUTROS ABS: 1.8 10*3/uL (ref 1.4–7.7)
NEUTROS PCT: 50.2 % (ref 43.0–77.0)
Platelets: 149 10*3/uL — ABNORMAL LOW (ref 150.0–400.0)
RBC: 4.64 Mil/uL (ref 3.87–5.11)
RDW: 17.1 % — ABNORMAL HIGH (ref 11.5–15.5)
WBC: 3.5 10*3/uL — AB (ref 4.0–10.5)

## 2017-12-19 LAB — COMPREHENSIVE METABOLIC PANEL
ALK PHOS: 66 U/L (ref 39–117)
ALT: 12 U/L (ref 0–35)
AST: 15 U/L (ref 0–37)
Albumin: 4.2 g/dL (ref 3.5–5.2)
BILIRUBIN TOTAL: 0.4 mg/dL (ref 0.2–1.2)
BUN: 14 mg/dL (ref 6–23)
CO2: 29 mEq/L (ref 19–32)
CREATININE: 0.72 mg/dL (ref 0.40–1.20)
Calcium: 9.4 mg/dL (ref 8.4–10.5)
Chloride: 106 mEq/L (ref 96–112)
GFR: 107.37 mL/min (ref >60.00)
Glucose, Bld: 141 mg/dL — ABNORMAL HIGH (ref 70–99)
Potassium: 4 mEq/L (ref 3.5–5.1)
SODIUM: 141 meq/L (ref 135–145)
TOTAL PROTEIN: 7.2 g/dL (ref 6.0–8.3)

## 2017-12-19 LAB — LIPID PANEL
Cholesterol: 172 mg/dL (ref 0–200)
HDL: 46.7 mg/dL (ref >39.00)
LDL Cholesterol: 115 mg/dL — ABNORMAL HIGH (ref 0–99)
NonHDL: 125.76
TRIGLYCERIDES: 53 mg/dL (ref 0.0–149.0)
Total CHOL/HDL Ratio: 4
VLDL: 10.6 mg/dL (ref 0.0–40.0)

## 2017-12-19 LAB — VITAMIN B12: VITAMIN B 12: 118 pg/mL — AB (ref 211–911)

## 2017-12-19 LAB — HEMOGLOBIN A1C: Hgb A1c MFr Bld: 8.2 % — ABNORMAL HIGH (ref 4.6–6.5)

## 2017-12-19 LAB — TSH: TSH: 0.86 u[IU]/mL (ref 0.35–4.50)

## 2017-12-19 MED ORDER — LOSARTAN POTASSIUM 50 MG PO TABS: 50.0000 mg | ORAL_TABLET | Freq: Every day | ORAL | 11 refills | Status: DC

## 2017-12-19 MED ORDER — METFORMIN HCL 500 MG PO TABS: 500.0000 mg | ORAL_TABLET | Freq: Two times a day (BID) | ORAL | 11 refills | Status: DC

## 2017-12-19 MED ORDER — FLUTICASONE PROPIONATE 50 MCG/ACT NA SUSP: 2.0000 | Freq: Every day | NASAL | 11 refills | Status: DC

## 2017-12-19 MED ORDER — CYANOCOBALAMIN 1000 MCG/ML IJ SOLN
1000.0000 ug | Freq: Once | INTRAMUSCULAR | Status: AC
  Administered 2017-12-19: 1000 ug via INTRAMUSCULAR

## 2017-12-19 NOTE — Progress Notes (Signed)
Subjective:    Patient ID: [name], female    DOB: Nov 02, 1960, 57 y.o.   MRN: [id number]  HPI  Here for f/u of chronic medical problems   Recently underwent procedure for AIN   (now using Aldara for this) - supposed to f/u with [facility] but she does not like the doctor she saw (Dr Marcello Moores)  Is looking into who she may want to see someone in [place]  Had a lot of bleeding -stopped now  Taking benefiber   Is supposed to see surgeon   Wt Readings from Last 3 Encounters:  12/19/17 189 lb 12 oz (86.1 kg)  10/18/17 195 lb (88.5 kg)  10/09/17 195 lb 9.6 oz (88.7 kg)  wt is down 6 lb - did not have an appetite for a long time  33.61 kg/m    Last visit added lisinopril for HTN and renal protection (stopped it because it caused her to cough)  bp is up today- also at home and she is sometimes symptomatic  No cp or palpitations or headaches or edema  No side effects to medicines  BP Readings from Last 3 Encounters:  12/19/17 (!) 158/96  10/18/17 136/77  10/09/17 (!) 151/98     DM2 Lab Results  Component Value Date   HGBA1C 7.6 (H) 12/20/2016  this was down from 9 in 6/18 Due for a check  Ran out of metformin about 2 weeks ago  ams about 140  Same in the afternoon  She follows a good diet (avoids sweets and breads)  No exercise -with everything going on  Starting to feel well enough to start    H/o anemia and low B12 Lab Results  Component Value Date   VITAMINB12 121 (L) 12/20/2016  has not taken any B12    Hyperlipidemia Lab Results  Component Value Date   CHOL 174 12/20/2016   HDL 49.40 12/20/2016   LDLCALC 105 (H) 12/20/2016   TRIG 100.0 12/20/2016   CHOLHDL 4 12/20/2016    Patient Active Problem List   Diagnosis Date Noted  . 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 Essential hypertension 10/11/2016  . Screening mammogram, encounter for 08/30/2016  . Routine general medical examination at a health care facility 08/30/2016    . Encounter for screening for HIV 08/30/2016  . Need for hepatitis C screening test 08/30/2016  . History of herpes zoster 05/12/2015  . Hyperlipidemia with target low density lipoprotein (LDL) cholesterol less than 100 mg/dL 12/21/2012  . Obesity 12/21/2012  . Vaginal atrophy 12/18/2012  . Dyspepsia 08/20/2012  . Rectal bleeding 12/04/2011  . Lower back pain 12/04/2011  . ELEVATED BLOOD PRESSURE 06/04/2010  . ANEMIA, B12 DEFICIENCY 10/05/2008  . THROMBOCYTOPENIA 10/05/2008  . Allergic rhinitis 10/05/2008  . Chronic headache 10/05/2008  . FREQUENCY, URINARY 10/05/2008  . Type 2 diabetes mellitus without complications ([place]) 63/84/6659  . ANEMIA, IRON DEFICIENCY 01/26/2008   Past Medical History:  Diagnosis Date  . Anal intraepithelial neoplasia II (AIN II)   . Arthritis    shoulder, feet  . B12 deficiency   . Essential hypertension    followed by pcp--- currently no meds,  states when she stopped sinus medication otc , bp better  . High frequency hearing loss   . History of abnormal Pap smear   . History of uterine leiomyoma   . Hx of migraine headaches   . Iron deficiency anemia   . Type 2 diabetes mellitus ([place])  followed by pcp   Past Surgical History:  Procedure Laterality Date  . BUNIONECTOMY Bilateral 1982  . CARDIOVASCULAR STRESS TEST  09-19-2010   @ [place]   normal nuclear study w/ no evidence ishemia/  normal LV funciton and wall motion , ef 81%  . COLONOSCOPY WITH ESOPHAGOGASTRODUODENOSCOPY (EGD)  last one 08-21-2017  . HIGH RESOLUTION ANOSCOPY N/A 10/09/2017   Procedure: HIGH RESOLUTION ANOSCOPY,  BIOPSY  EXCISION;  Surgeon: Leighton Ruff, MD;  Location: [facility];  Service: General;  Laterality: N/A;  . LAPAROSCOPIC ASSISTED VAGINAL HYSTERECTOMY  09-18-2005    dr Earvin Hansen  . LEEP    . MYOMECTOMY ABDOMINAL APPROACH  09-04-2001    dr Cletis Media [facility]   w/ lysis adhesions  . NASAL ENDOSCOPY  03/2007   neg  . TONSILLECTOMY  child   Social History    Tobacco Use  . Smoking status: Never Smoker  . Smokeless tobacco: Never Used  Substance Use Topics  . Alcohol use: Yes    Comment: occasional wine  . Drug use: No   Family History  Problem Relation Age of Onset  . Heart disease Sister   . Arthritis Sister   . Diabetes Sister   . Hypertension Sister   . Diabetes Maternal Grandmother   . Hypertension Maternal Grandmother   . Lung cancer Father   . Colon cancer Father   . Breast cancer Neg Hx   . Esophageal cancer Neg Hx   . Stomach cancer Neg Hx   . Rectal cancer Neg Hx    Allergies  Allergen Reactions  . Lisinopril Cough  . Amoxicillin-Pot Clavulanate Rash    REACTION: Itchy rash   Current Outpatient Medications on File Prior to Visit  Medication Sig Dispense Refill  . acetaminophen (TYLENOL) 500 MG tablet Take 500 mg by mouth every 6 (six) hours as needed.    Marland Kitchen HYDROcodone-acetaminophen (NORCO/VICODIN) 5-325 MG tablet Take 1-2 tablets by mouth every 6 (six) hours as needed. 20 tablet 0  . ondansetron (ZOFRAN) 4 MG tablet Take 1 tablet (4 mg total) by mouth every 8 (eight) hours as needed for nausea or vomiting. 4 tablet 0   No current facility-administered medications on file prior to visit.     Review of Systems  Constitutional: Positive for fatigue. Negative for activity change, appetite change, fever and unexpected weight change.  HENT: Negative for congestion, ear pain, rhinorrhea, sinus pressure and sore throat.   Eyes: Negative for pain, redness and visual disturbance.  Respiratory: Negative for cough, shortness of breath and wheezing.   Cardiovascular: Negative for chest pain and palpitations.  Gastrointestinal: Negative for abdominal pain, blood in stool, constipation and diarrhea.  Endocrine: Negative for polydipsia and polyuria.  Genitourinary: Negative for dysuria, frequency and urgency.  Musculoskeletal: Negative for arthralgias, back pain and myalgias.  Skin: Negative for pallor and rash.   Allergic/Immunologic: Negative for environmental allergies.  Neurological: Negative for dizziness, syncope and headaches.  Hematological: Negative for adenopathy. Does not bruise/bleed easily.  Psychiatric/Behavioral: Negative for decreased concentration and dysphoric mood. The patient is not nervous/anxious.        Objective:   Physical Exam  Constitutional: She appears well-developed and well-nourished. No distress.  obese and well appearing   HENT:  Head: Normocephalic and atraumatic.  Right Ear: External ear normal.  Left Ear: External ear normal.  Nose: Nose normal.  Mouth/Throat: Oropharynx is clear and moist.  Eyes: Pupils are equal, round, and reactive to light. Conjunctivae and EOM are  normal. Right eye exhibits no discharge. Left eye exhibits no discharge. No scleral icterus.  Neck: Normal range of motion. Neck supple. No JVD present. Carotid bruit is not present. No thyromegaly present.  Cardiovascular: Normal rate, regular rhythm, normal heart sounds and intact distal pulses. Exam reveals no gallop.  Pulmonary/Chest: Effort normal and breath sounds normal. No respiratory distress. She has no wheezes. She has no rales.  Abdominal: Soft. Bowel sounds are normal. She exhibits no distension and no mass. There is no tenderness.  Musculoskeletal: She exhibits no edema or tenderness.  Lymphadenopathy:    She has no cervical adenopathy.  Neurological: She is alert. She has normal reflexes. She displays normal reflexes. No cranial nerve deficit. She exhibits normal muscle tone. Coordination normal.  Skin: Skin is warm and dry. No rash noted. No erythema. No pallor.  Psychiatric: She has a normal mood and affect.  Pleasant           Assessment & Plan:   Problem List Items Addressed This Visit      Cardiovascular and Mediastinum   Essential hypertension - Primary    Intol of lisinopril (cough)  Will try losartan 50 mg daily  bp in fair control at this time  BP Readings  from Last 1 Encounters:  12/19/17 (!) 158/96   Most recent labs reviewed  Disc lifstyle change with low sodium diet and exercise   F/u planned 1 mo      Relevant Medications   losartan (COZAAR) 50 MG tablet   Other Relevant Orders   CBC with Differential/Platelet (Completed)   Comprehensive metabolic panel (Completed)   Lipid panel (Completed)   TSH (Completed)     Digestive   AIN (anal intraepithelial neoplasia) anal canal    Pt is using aldara  Does not want to f/u with [facility]- she is considering [facility] instead  Will call when she is ready for a referral         Endocrine   Type 2 diabetes mellitus without complications ([place])    Due for A1C Ran out of metformin 2 wk ago  Thinks diet is good/fasting glucose around 140  Disc imp of diet/exercise        Relevant Medications   metFORMIN (GLUCOPHAGE) 500 MG tablet   losartan (COZAAR) 50 MG tablet   Other Relevant Orders   Hemoglobin A1c (Completed)     Other   ANEMIA, B12 DEFICIENCY    B12 level 121 a year ago- pt never treated  B12 shot today  Level and plan to follow tx should have favorable eff on energy      Relevant Medications   cyanocobalamin ((VITAMIN B-12)) injection 1,000 mcg (Completed)   Other Relevant Orders   Vitamin B12 (Completed)   Hyperlipidemia with target low density lipoprotein (LDL) cholesterol less than 100 mg/dL    Disc goals for lipids and reasons to control them Rev last labs with pt Rev low sat fat diet in detail Labs today  Disc rec for all diabetics to be on statin -she is hesitant to start now  Will work on bp control first and disc at f/u      Relevant Medications   losartan (COZAAR) 50 MG tablet   Other Relevant Orders   Lipid panel (Completed)   Obesity    Discussed how this problem influences overall health and the risks it imposes  Reviewed plan for weight loss with lower calorie diet (via better food choices and also portion control or program  like weight watchers)  and exercise building up to or more than 30 minutes 5 days per week including some aerobic activity         Relevant Medications   metFORMIN (GLUCOPHAGE) 500 MG tablet   THROMBOCYTOPENIA    Cbc today  Rectal bleeding haas stopped

## 2017-12-19 NOTE — Patient Instructions (Addendum)
Let us know when or if you want a referral to a new surgeon   miralax is safe for constipation  Also colace (a stool softener)  Keep drinking lots of fluids  Labs today   B12 shot today   Start losartan 50 mg once daily for blood pressure  If any side effects or problems please hold it and let us know   Follow up in about a month

## 2017-12-20 DIAGNOSIS — K6282 Dysplasia of anus: Secondary | ICD-10-CM | POA: Insufficient documentation

## 2017-12-20 DIAGNOSIS — D013 Carcinoma in situ of anus and anal canal: Secondary | ICD-10-CM | POA: Insufficient documentation

## 2017-12-20 NOTE — Assessment & Plan Note (Signed)
Disc goals for lipids and reasons to control them Rev last labs with pt Rev low sat fat diet in detail Labs today  Disc rec for all diabetics to be on statin -she is hesitant to start now  Will work on bp control first and disc at f/u

## 2017-12-20 NOTE — Assessment & Plan Note (Signed)
Intol of lisinopril (cough)  Will try losartan 50 mg daily  bp in fair control at this time  BP Readings from Last 1 Encounters:  12/19/17 (!) 158/96   Most recent labs reviewed  Disc lifstyle change with low sodium diet and exercise   F/u planned 1 mo

## 2017-12-20 NOTE — Assessment & Plan Note (Signed)
B12 level 121 a year ago- pt never treated  B12 shot today  Level and plan to follow tx should have favorable eff on energy

## 2017-12-20 NOTE — Assessment & Plan Note (Signed)
Pt is using aldara  Does not want to f/u with [facility]- she is considering [facility] instead  Will call when she is ready for a referral

## 2017-12-20 NOTE — Assessment & Plan Note (Signed)
Due for A1C Ran out of metformin 2 wk ago  Thinks diet is good/fasting glucose around 140  Disc imp of diet/exercise

## 2017-12-20 NOTE — Assessment & Plan Note (Signed)
Discussed how this problem influences overall health and the risks it imposes  Reviewed plan for weight loss with lower calorie diet (via better food choices and also portion control or program like weight watchers) and exercise building up to or more than 30 minutes 5 days per week including some aerobic activity    

## 2017-12-20 NOTE — Assessment & Plan Note (Signed)
Cbc today  Rectal bleeding haas stopped

## 2017-12-22 ENCOUNTER — Ambulatory Visit (INDEPENDENT_AMBULATORY_CARE_PROVIDER_SITE_OTHER): Payer: Commercial Managed Care - PPO | Admitting: Family Medicine

## 2017-12-22 ENCOUNTER — Encounter: Payer: Self-pay | Admitting: Family Medicine

## 2017-12-22 ENCOUNTER — Telehealth: Payer: Self-pay

## 2017-12-22 VITALS — BP 142/94 | HR 78 | Temp 98.9°F | Ht 63.0 in | Wt 194.8 lb

## 2017-12-22 DIAGNOSIS — I10 Essential (primary) hypertension: Secondary | ICD-10-CM

## 2017-12-22 DIAGNOSIS — R0789 Other chest pain: Secondary | ICD-10-CM

## 2017-12-22 NOTE — Telephone Encounter (Signed)
Pt walked in; pt was seen 12/19/17 for elevated BP but no chest symptoms; since 12/21/17 at 7 PM has consistent chest tightness across upper chest.light headed and SOB on and off. Pt does not look in any distress. Pt has been taking Losartan 50 mg one daily since 12/19/17; 12/22/17 at 7AM BP 173/98. Pt not having any weakness in extremities, No N&V and no H/A. Glenda Chroman FNP to see pt at 2:15 today. T 98.9 P 78 BP 160/94 lge cuff LA sitting; pulse ox 98% room air. FYI to Dr Glori Bickers and Glenda Chroman FNP.

## 2017-12-22 NOTE — Patient Instructions (Addendum)
Take your blood pressure twice a week- keep a log  Follow up with Dr. Glori Bickers in 2-3 weeks   How to Take Your Blood Pressure You can take your blood pressure at home with a machine. You may need to check your blood pressure at home:  To check if you have high blood pressure (hypertension).  To check your blood pressure over time.  To make sure your blood pressure medicine is working.  Supplies needed: You will need a blood pressure machine, or monitor. You can buy one at a drugstore or online. When choosing one:  Choose one with an arm cuff.  Choose one that wraps around your upper arm. Only one finger should fit between your arm and the cuff.  Do not choose one that measures your blood pressure from your wrist or finger.  Your doctor can suggest a monitor. How to prepare Avoid these things for 30 minutes before checking your blood pressure:  Drinking caffeine.  Drinking alcohol.  Eating.  Smoking.  Exercising.  Five minutes before checking your blood pressure:  Pee.  Sit in a dining chair. Avoid sitting in a soft couch or armchair.  Be quiet. Do not talk.  How to take your blood pressure Follow the instructions that came with your machine. If you have a digital blood pressure monitor, these may be the instructions: 1. Sit up straight. 2. Place your feet on the floor. Do not cross your ankles or legs. 3. Rest your left arm at the level of your heart. You may rest it on a table, desk, or chair. 4. Pull up your shirt sleeve. 5. Wrap the blood pressure cuff around the upper part of your left arm. The cuff should be 1 inch (2.5 cm) above your elbow. It is best to wrap the cuff around bare skin. 6. Fit the cuff snugly around your arm. You should be able to place only one finger between the cuff and your arm. 7. Put the cord inside the groove of your elbow. 8. Press the power button. 9. Sit quietly while the cuff fills with air and loses air. 10. Write down the numbers  on the screen. 11. Wait 2-3 minutes and then repeat steps 1-10.  What do the numbers mean? Two numbers make up your blood pressure. The first number is called systolic pressure. The second is called diastolic pressure. An example of a blood pressure reading is "120 over 80" (or 120/80). If you are an adult and do not have a medical condition, use this guide to find out if your blood pressure is normal: Normal  First number: below 120.  Second number: below 80. Elevated  First number: 120-129.  Second number: below 80. Hypertension stage 1  First number: 130-139.  Second number: 80-89. Hypertension stage 2  First number: 140 or above.  Second number: 26 or above. Your blood pressure is above normal even if only the top or bottom number is above normal. Follow these instructions at home:  Check your blood pressure as often as your doctor tells you to.  Take your monitor to your next doctor's appointment. Your doctor will: ? Make sure you are using it correctly. ? Make sure it is working right.  Make sure you understand what your blood pressure numbers should be.  Tell your doctor if your medicines are causing side effects. Contact a doctor if:  Your blood pressure keeps being high. Get help right away if:  Your first blood pressure number is higher  than 180.  Your second blood pressure number is higher than 120. This information is not intended to replace advice given to you by your health care provider. Make sure you discuss any questions you have with your health care provider. Document Released: 02/29/2008 Document Revised: 02/14/2016 Document Reviewed: 08/25/2015 Elsevier Interactive Patient Education  [employer].

## 2017-12-22 NOTE — Progress Notes (Signed)
Subjective:    Patient ID: [name], female    DOB: [birth year], 57 y.o.   MRN: [id number]  HPI This is a 57 yo female who presents today as a walk in with chest tightness. Started last night, has not gone away. No recent exercise. Has been feeling short of breath, "breathing real hard." No nausea. No pain up neck or down arms. No diaphoresis. Episodes for about 1 month. Have been coming and going, sometimes notices at work. Stands all day at work, uses arms on assembly line. This does not bring on episodes. Unable to identify any trigger, can't discern how long episodes last.  Home blood pressures- 175/10, 258-527 systolic. Started losartan 50 mg 3 days ago, last dose this morning at 6:30. Some increased fatigue. No cough. No recent symptoms of acid reflux or indigestion. Was instructed to follow up with PCP at visit 3 days ago, did not make follow up appointment. Denies stress/anxiety.  History of hysterectomy, ovaries remain.    Past Medical History:  Diagnosis Date  . Anal intraepithelial neoplasia II (AIN II)   . Arthritis    shoulder, feet  . B12 deficiency   . Essential hypertension    followed by pcp--- currently no meds,  states when she stopped sinus medication otc , bp better  . High frequency hearing loss   . History of abnormal Pap smear   . History of uterine leiomyoma   . Hx of migraine headaches   . Iron deficiency anemia   . Type 2 diabetes mellitus ([place])    followed by pcp   Past Surgical History:  Procedure Laterality Date  . BUNIONECTOMY Bilateral 1982  . CARDIOVASCULAR STRESS TEST  09-19-2010   @ [place]   normal nuclear study w/ no evidence ishemia/  normal LV funciton and wall motion , ef 81%  . COLONOSCOPY WITH ESOPHAGOGASTRODUODENOSCOPY (EGD)  last one 08-21-2017  . HIGH RESOLUTION ANOSCOPY N/A 10/09/2017   Procedure: HIGH RESOLUTION ANOSCOPY,  BIOPSY  EXCISION;  Surgeon: Leighton Ruff, MD;  Location: [place];  Service: General;   Laterality: N/A;  . LAPAROSCOPIC ASSISTED VAGINAL HYSTERECTOMY  09-18-2005    dr Earvin Hansen  . LEEP    . MYOMECTOMY ABDOMINAL APPROACH  09-04-2001    dr [facility]   w/ lysis adhesions  . NASAL ENDOSCOPY  03/2007   neg  . TONSILLECTOMY  child   Family History  Problem Relation Age of Onset  . Heart disease Sister   . Arthritis Sister   . Diabetes Sister   . Hypertension Sister   . Diabetes Maternal Grandmother   . Hypertension Maternal Grandmother   . Lung cancer Father   . Colon cancer Father   . Breast cancer Neg Hx   . Esophageal cancer Neg Hx   . Stomach cancer Neg Hx   . Rectal cancer Neg Hx    Social History   Tobacco Use  . Smoking status: Never Smoker  . Smokeless tobacco: Never Used  Substance Use Topics  . Alcohol use: Yes    Comment: occasional wine  . Drug use: No      Review of Systems Per HPi    Objective:   Physical Exam Physical Exam  Constitutional: Oriented to person, place, and time. She appears well-developed and well-nourished.  HENT:  Head: Normocephalic and atraumatic.  Eyes: Conjunctivae are normal.  Neck: Normal range of motion. Neck supple.  Cardiovascular: Normal rate, regular rhythm and normal heart sounds.  Pulmonary/Chest: Effort normal and breath sounds normal. No chest wall tenderness.  Musculoskeletal: No edema.  Neurological: Alert and oriented to person, place, and time.  Skin: Skin is warm and dry.  Psychiatric: Normal mood and affect. Behavior is normal. Judgment and thought content normal.  Vitals reviewed.     BP (!) 160/94 (BP Location: Left Arm, Patient Position: Sitting, Cuff Size: Large)   Pulse 78   Temp 98.9 F (37.2 C) (Oral)   Ht 5\' 3"  (1.6 m)   Wt 194 lb 12.8 oz (88.4 kg)   SpO2 98%   BMI 34.51 kg/m  Wt Readings from Last 3 Encounters:  12/22/17 194 lb 12.8 oz (88.4 kg)  12/19/17 189 lb 12 oz (86.1 kg)  10/18/17 195 lb (88.5 kg)   BP Readings from Last 3 Encounters:  12/22/17 (!) 160/94  12/19/17  (!) 158/96  10/18/17 136/77   BP: (!) 142/94    EKG- NSR, rate 74    Assessment & Plan:  1. Chest tightness - EKG reveals NSR, symptoms do not sound cardiac in nature - RTC/ER precautions reviewed - EKG 12-Lead  2. Essential hypertension - blood pressure improved on recheck, discussed importance of follow up with PCP since starting losartan, she was instructed to make follow up appointment prior to leaving the office today  Clarene Reamer, FNP-BC  [facility], [facility]  12/28/2017 8:20 PM

## 2017-12-22 NOTE — Telephone Encounter (Signed)
Noted. Patient with appointment.

## 2017-12-28 ENCOUNTER — Encounter: Payer: Self-pay | Admitting: Family Medicine

## 2018-01-16 ENCOUNTER — Ambulatory Visit: Payer: Commercial Managed Care - PPO | Admitting: Family Medicine

## 2018-01-18 ENCOUNTER — Encounter: Payer: Self-pay | Admitting: Family Medicine

## 2018-01-19 ENCOUNTER — Telehealth: Payer: Self-pay | Admitting: Family Medicine

## 2018-01-19 NOTE — Telephone Encounter (Signed)
Copied from [place] [id number]. Topic: Quick Communication - See Telephone Encounter >> Jan 19, 2018  5:23 PM [place] N[place], [place] wrote: CRM for notification. See Telephone encounter for: 01/19/18. Patient calling and states that she is on losartan (COZAAR) 50 MG tablet and Dr Glori Bickers told her yesterday [facility] because [facility] is [facility]. States today the pharmacy told her it is on back order. States that they said they could get the 25 mg, but Dr Glori Bickers would have to rewrite the prescription to fit the mg. CB#: [phone] [id number] [place], [place] - [address]

## 2018-01-20 MED ORDER — LOSARTAN POTASSIUM 25 MG PO TABS: 50.0000 mg | ORAL_TABLET | Freq: Every day | ORAL | 11 refills | Status: DC

## 2018-01-20 NOTE — Telephone Encounter (Signed)
I sent the 25 mg to take 2 once daily

## 2018-01-30 ENCOUNTER — Ambulatory Visit: Payer: Commercial Managed Care - PPO | Admitting: Family Medicine

## 2018-01-30 ENCOUNTER — Encounter: Payer: Self-pay | Admitting: Family Medicine

## 2018-01-30 VITALS — BP 156/94 | HR 75 | Temp 98.2°F | Ht 63.0 in | Wt 190.0 lb

## 2018-01-30 DIAGNOSIS — I10 Essential (primary) hypertension: Secondary | ICD-10-CM | POA: Diagnosis not present

## 2018-01-30 DIAGNOSIS — E119 Type 2 diabetes mellitus without complications: Secondary | ICD-10-CM

## 2018-01-30 DIAGNOSIS — R300 Dysuria: Secondary | ICD-10-CM | POA: Insufficient documentation

## 2018-01-30 DIAGNOSIS — E785 Hyperlipidemia, unspecified: Secondary | ICD-10-CM | POA: Diagnosis not present

## 2018-01-30 DIAGNOSIS — J069 Acute upper respiratory infection, unspecified: Secondary | ICD-10-CM

## 2018-01-30 DIAGNOSIS — Z6833 Body mass index (BMI) 33.0-33.9, adult: Secondary | ICD-10-CM

## 2018-01-30 DIAGNOSIS — E6609 Other obesity due to excess calories: Secondary | ICD-10-CM

## 2018-01-30 LAB — POC URINALSYSI DIPSTICK (AUTOMATED)
BILIRUBIN UA: NEGATIVE
Blood, UA: NEGATIVE
Glucose, UA: NEGATIVE
Ketones, UA: NEGATIVE
Leukocytes, UA: NEGATIVE
NITRITE UA: NEGATIVE
PH UA: 6 (ref 5.0–8.0)
Protein, UA: NEGATIVE
Spec Grav, UA: >=1.03 — AB (ref 1.010–1.025)
Urobilinogen, UA: 0.2 E.U./dL

## 2018-01-30 MED ORDER — BENZONATATE 200 MG PO CAPS: 200.0000 mg | ORAL_CAPSULE | Freq: Three times a day (TID) | ORAL | 1 refills | Status: DC | PRN

## 2018-01-30 MED ORDER — PROMETHAZINE-DM 6.25-15 MG/5ML PO SYRP: 5.0000 mL | ORAL_SOLUTION | Freq: Four times a day (QID) | ORAL | 0 refills | Status: DC | PRN

## 2018-01-30 MED ORDER — SULFAMETHOXAZOLE-TRIMETHOPRIM 800-160 MG PO TABS: 1.0000 | ORAL_TABLET | Freq: Two times a day (BID) | ORAL | 0 refills | Status: DC

## 2018-01-30 NOTE — Progress Notes (Signed)
Subjective:    Patient ID: [name], female    DOB: 1960/09/26, 57 y.o.   MRN: [id number]  HPI Here for f/u of chronic med problems  Actually acute issues are more important   Also uri symptoms for 2 weeks  Feels "like crap"  Cough comes and goes  Blows nose a little -but mostly drainage Headache - ? Sinus pain and top of head  Thinks she had a fever last night - sweats (low grade when she checks it)  Chills and body aches   Throat hurts  Ears hurt  Mucous is - very thick / some tinge of blood   Cough -dry and nagging cough (comes and goes)  Gags her  Some mucous- ? Color / in her throat  Taking a lot of otc cold meds (? That raised her bp)   Had the flu shot   Yesterday- started to burn with urination  Urine is dark - she is now drinking more fluids  Frequency and urgency this week  No blood in urine  Does not drink enough fluids   UA: Results for orders placed or performed in visit on 01/30/18  POCT Urinalysis Dipstick (Automated)  Result Value Ref Range   Color, UA Yellow    Clarity, UA Hazy    Glucose, UA Negative Negative   Bilirubin, UA Negative    Ketones, UA Negative    Spec Grav, UA >=1.030 (A) 1.010 - 1.025   Blood, UA Negative    pH, UA 6.0 5.0 - 8.0   Protein, UA Negative Negative   Urobilinogen, UA 0.2 0.2 or 1.0 E.U./dL   Nitrite, UA Negative    Leukocytes, UA Negative Negative       Wt Readings from Last 3 Encounters:  01/30/18 190 lb (86.2 kg)  12/22/17 194 lb 12.8 oz (88.4 kg)  12/19/17 189 lb 12 oz (86.1 kg)  33.66 kg/m Down 4 lb    Last visit added cozaar 50 mg for HTN  (lisinopril had caused cough)  bp is stable today (still elevated)-pt thinks due to discomfort and cold medicines No cp or palpitations or headaches or edema  No side effects to medicines  BP Readings from Last 3 Encounters:  01/30/18 (!) 156/94  12/22/17 (!) 142/94  12/19/17 (!) 158/96    ? If cold medicines  At home [id number] average systolic  over 19F    Lab Results  Component Value Date   CREATININE 0.72 12/19/2017   BUN 14 12/19/2017   NA 141 12/19/2017   K 4.0 12/19/2017   CL 106 12/19/2017   CO2 29 12/19/2017    Diabetes Home sugar results 130s to 140s in am (does not check later)  When she gets home lower- around 100  DM diet - eating healthy  Exercise -works  Symptoms A1C last  Lab Results  Component Value Date   HGBA1C 8.2 (H) 12/19/2017  now back on metformin 500 mg bid (? Diarrhea/ she also takes stool softeners)  No problems with medications  Renal protection-arb Last eye exam -has planned at end of dec Last was dec 2018    Cholesterol   Lab Results  Component Value Date   CHOL 172 12/19/2017   CHOL 174 12/20/2016   CHOL 173 08/30/2016   Lab Results  Component Value Date   HDL 46.70 12/19/2017   HDL 49.40 12/20/2016   HDL 41.60 08/30/2016   Lab Results  Component Value Date   LDLCALC 115 (H) 12/19/2017  LDLCALC 105 (H) 12/20/2016   LDLCALC 121 (H) 08/30/2016   Lab Results  Component Value Date   TRIG 53.0 12/19/2017   TRIG 100.0 12/20/2016   TRIG 56.0 08/30/2016   Lab Results  Component Value Date   CHOLHDL 4 12/19/2017   CHOLHDL 4 12/20/2016   CHOLHDL 4 08/30/2016   No results found for: LDLDIRECT   Patient Active Problem List   Diagnosis Date Noted  . Dysuria 01/30/2018  . Upper respiratory infection 01/30/2018  . 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 Essential hypertension 10/11/2016  . Screening mammogram, encounter for 08/30/2016  . Routine general medical examination at a health care facility 08/30/2016  . Encounter for screening for HIV 08/30/2016  . Need for hepatitis C screening test 08/30/2016  . History of herpes zoster 05/12/2015  . Hyperlipidemia with target low density lipoprotein (LDL) cholesterol less than 100 mg/dL 12/21/2012  . Obesity 12/21/2012  . Vaginal atrophy 12/18/2012  . Dyspepsia 08/20/2012  . Rectal bleeding  12/04/2011  . Lower back pain 12/04/2011  . ELEVATED BLOOD PRESSURE 06/04/2010  . ANEMIA, B12 DEFICIENCY 10/05/2008  . THROMBOCYTOPENIA 10/05/2008  . Allergic rhinitis 10/05/2008  . Chronic headache 10/05/2008  . FREQUENCY, URINARY 10/05/2008  . Type 2 diabetes mellitus without complications ([place]) 14/78/2956  . ANEMIA, IRON DEFICIENCY 01/26/2008   Past Medical History:  Diagnosis Date  . Anal intraepithelial neoplasia II (AIN II)   . Arthritis    shoulder, feet  . B12 deficiency   . Essential hypertension    followed by pcp--- currently no meds,  states when she stopped sinus medication otc , bp better  . High frequency hearing loss   . History of abnormal Pap smear   . History of uterine leiomyoma   . Hx of migraine headaches   . Iron deficiency anemia   . Type 2 diabetes mellitus ([place])    followed by pcp   Past Surgical History:  Procedure Laterality Date  . BUNIONECTOMY Bilateral 1982  . CARDIOVASCULAR STRESS TEST  09-19-2010   @ [facility]   normal nuclear study w/ no evidence ishemia/  normal LV funciton and wall motion , ef 81%  . COLONOSCOPY WITH ESOPHAGOGASTRODUODENOSCOPY (EGD)  last one 08-21-2017  . HIGH RESOLUTION ANOSCOPY N/A 10/09/2017   Procedure: HIGH RESOLUTION ANOSCOPY,  BIOPSY  EXCISION;  Surgeon: Leighton Ruff, MD;  Location: [facility];  Service: General;  Laterality: N/A;  . LAPAROSCOPIC ASSISTED VAGINAL HYSTERECTOMY  09-18-2005    dr Earvin Hansen  . LEEP    . MYOMECTOMY ABDOMINAL APPROACH  09-04-2001    dr [facility]   w/ lysis adhesions  . NASAL ENDOSCOPY  03/2007   neg  . TONSILLECTOMY  child   Social History   Tobacco Use  . Smoking status: Never Smoker  . Smokeless tobacco: Never Used  Substance Use Topics  . Alcohol use: Yes    Comment: occasional wine  . Drug use: No   Family History  Problem Relation Age of Onset  . Heart disease Sister   . Arthritis Sister   . Diabetes Sister   . Hypertension Sister   . Diabetes Maternal  Grandmother   . Hypertension Maternal Grandmother   . Lung cancer Father   . Colon cancer Father   . Breast cancer Neg Hx   . Esophageal cancer Neg Hx   . Stomach cancer Neg Hx   . Rectal cancer Neg Hx    Allergies  Allergen Reactions  .  Lisinopril Cough  . Amoxicillin-Pot Clavulanate Rash    REACTION: Itchy rash   Current Outpatient Medications on File Prior to Visit  Medication Sig Dispense Refill  . acetaminophen (TYLENOL) 500 MG tablet Take 500 mg by mouth every 6 (six) hours as needed.    . fluticasone (FLONASE) 50 MCG/ACT nasal spray Place 2 sprays into both nostrils daily. 16 g 11  . HYDROcodone-acetaminophen (NORCO/VICODIN) 5-325 MG tablet Take 1-2 tablets by mouth every 6 (six) hours as needed. 20 tablet 0  . losartan (COZAAR) 25 MG tablet Take 2 tablets (50 mg total) by mouth daily. 60 tablet 11  . metFORMIN (GLUCOPHAGE) 500 MG tablet Take 1 tablet (500 mg total) by mouth 2 (two) times daily with a meal. 60 tablet 11  . ondansetron (ZOFRAN) 4 MG tablet Take 1 tablet (4 mg total) by mouth every 8 (eight) hours as needed for nausea or vomiting. 4 tablet 0   No current facility-administered medications on file prior to visit.     Review of Systems  Constitutional: Positive for appetite change and fatigue. Negative for activity change and fever.  HENT: Positive for congestion, postnasal drip, rhinorrhea, sinus pressure, sneezing and sore throat. Negative for ear pain.   Eyes: Negative for pain, discharge, itching and visual disturbance.  Respiratory: Positive for cough. Negative for shortness of breath, wheezing and stridor.   Cardiovascular: Negative for chest pain and leg swelling.  Gastrointestinal: Negative for abdominal distention, abdominal pain, constipation, diarrhea, nausea and vomiting.  Endocrine: Negative for cold intolerance and polydipsia.  Genitourinary: Positive for dysuria. Negative for difficulty urinating, flank pain, frequency, hematuria and urgency.    Musculoskeletal: Negative for arthralgias and myalgias.  Skin: Negative for rash.  Allergic/Immunologic: Negative for immunocompromised state.  Neurological: Positive for headaches. Negative for dizziness, weakness and light-headedness.  Hematological: Negative for adenopathy.  Psychiatric/Behavioral: Negative for confusion and dysphoric mood.       Objective:   Physical Exam  Constitutional: She appears well-developed and well-nourished. No distress.  obese and well appearing (but fatigued)   HENT:  Head: Normocephalic and atraumatic.  Right Ear: External ear normal.  Left Ear: External ear normal.  Mouth/Throat: Oropharynx is clear and moist. No oropharyngeal exudate.  Nares are injected and congested  Bilateral maxillary sinus tenderness  Post nasal drip   Eyes: Pupils are equal, round, and reactive to light. Conjunctivae and EOM are normal. Right eye exhibits no discharge. Left eye exhibits no discharge.  Neck: Normal range of motion. Neck supple.  Cardiovascular: Normal rate and regular rhythm.  Pulmonary/Chest: Effort normal and breath sounds normal. No stridor. No respiratory distress. She has no wheezes. She has no rales. She exhibits no tenderness.  Good air exch Few scattered rhonchi No rales or wheeze  Abdominal: Soft. Bowel sounds are normal. She exhibits no distension and no mass. There is no tenderness. There is no guarding.  Musculoskeletal: She exhibits no edema.  Lymphadenopathy:    She has no cervical adenopathy.  Neurological: She is alert. She displays normal reflexes. No cranial nerve deficit. Coordination normal.  Skin: Skin is warm and dry. No rash noted. No erythema.  Psychiatric: She has a normal mood and affect.          Assessment & Plan:   Problem List Items Addressed This Visit      Cardiovascular and Mediastinum   Essential hypertension    bp is still elevated-pt thinks due to illness and cold medication  Was lower at home before she got  sick Will f/u in about a month for re check BP: (!) 156/94    Will need to add tx if needed Enc lifestyle change         Respiratory   Upper respiratory infection - Primary    2 weeks of upper and lower resp symptoms  Cover with bactrim DS Fluids/rest Disc symptomatic care - see instructions on AVS  Update if not starting to improve in a week or if worsening    For cough -tessalon and prometh dm(caution of sedation) px      Relevant Medications   sulfamethoxazole-trimethoprim (BACTRIM DS,SEPTRA DS) 800-160 MG tablet     Endocrine   Type 2 diabetes mellitus without complications ([place])    Now back on metformin  Glucose is improved later in the day  Per pt eating well/no time for exercise  Enc to stop addnl med for constipation since metformin cause looser stool  Eye exam sent for  May inc metformin at f/u in a mo        Other   Dysuria    UA is neg but concentrated (this may cause pain with urination)  Enc inc fluids (water esp - up to 64 oz per day)       Relevant Orders   POCT Urinalysis Dipstick (Automated) (Completed)   Hyperlipidemia with target low density lipoprotein (LDL) cholesterol less than 100 mg/dL    LDL 115 Disc goals for lipids and reasons to control them Rev last labs with pt Rev low sat fat diet in detail Will disc tx opt at f/u (has DM- statin is adv)      Obesity    Discussed how this problem influences overall health and the risks it imposes  Reviewed plan for weight loss with lower calorie diet (via better food choices and also portion control or program like weight watchers) and exercise building up to or more than 30 minutes 5 days per week including some aerobic activity   Unfortunately-no time for exercise

## 2018-01-30 NOTE — Patient Instructions (Addendum)
For upper respiratory infection  Drink lots of fluids Get rest when you can   Continue the store brand claritin for post nasal drip  Take the bactrim DS - for possible bacterial infection (it also covers urine infections)  Try tessalon pills for cough  Also promethazine dm when you are not working or driving  Update if not starting to improve in a week or if worsening    I'm hoping when you feel better - your blood pressure will improve   For urinary symptoms- please leave a urine sample on the way out  Take the bactrim DS   Follow up 3-4 weeks (to see how your blood pressure and sugar are when you feel better)

## 2018-02-01 NOTE — Assessment & Plan Note (Signed)
Now back on metformin  Glucose is improved later in the day  Per pt eating well/no time for exercise  Enc to stop addnl med for constipation since metformin cause looser stool  Eye exam sent for  May inc metformin at f/u in a mo

## 2018-02-01 NOTE — Assessment & Plan Note (Signed)
UA is neg but concentrated (this may cause pain with urination)  Enc inc fluids (water esp - up to 64 oz per day)

## 2018-02-01 NOTE — Assessment & Plan Note (Signed)
LDL 115 Disc goals for lipids and reasons to control them Rev last labs with pt Rev low sat fat diet in detail Will disc tx opt at f/u (has DM- statin is adv)

## 2018-02-01 NOTE — Assessment & Plan Note (Addendum)
2 weeks of upper and lower resp symptoms  Cover with bactrim DS Fluids/rest Disc symptomatic care - see instructions on AVS  Update if not starting to improve in a week or if worsening    For cough -tessalon and prometh dm(caution of sedation) px

## 2018-02-01 NOTE — Assessment & Plan Note (Signed)
Discussed how this problem influences overall health and the risks it imposes  Reviewed plan for weight loss with lower calorie diet (via better food choices and also portion control or program like weight watchers) and exercise building up to or more than 30 minutes 5 days per week including some aerobic activity   Unfortunately-no time for exercise

## 2018-02-01 NOTE — Assessment & Plan Note (Signed)
bp is still elevated-pt thinks due to illness and cold medication  Was lower at home before she got sick Will f/u in about a month for re check BP: (!) 156/94    Will need to add tx if needed Enc lifestyle change

## 2018-02-04 ENCOUNTER — Encounter: Payer: Self-pay | Admitting: Podiatry

## 2018-02-04 ENCOUNTER — Ambulatory Visit: Payer: Commercial Managed Care - PPO | Admitting: Podiatry

## 2018-02-04 DIAGNOSIS — M2022 Hallux rigidus, left foot: Secondary | ICD-10-CM | POA: Diagnosis not present

## 2018-02-04 DIAGNOSIS — L84 Corns and callosities: Secondary | ICD-10-CM

## 2018-02-04 NOTE — Progress Notes (Signed)
She presents today concerned about the continuing pain of the first metatarsophalangeal joint of her left foot.  She states that it just seems to be getting worse and worse as time goes on she states that the small wound between the fourth and fifth toes has never gone to heal event either.  She states that her blood sugars getting better after she has been back to the doctor and her labs are becoming more normalized.  Objective: Vital signs are stable she is alert and oriented x3.  Pulses are palpable.  Neurologic sensorium is intact degenerative flexors are intact muscle strength is normal symmetrical.  Orthopedic evaluation demonstrates all joints distal to the ankle full range of motion without crepitation.  With exception of the first metatarsophalangeal joint of the left foot.  No open lesions or wounds with the exception of the heloma molle between the fourth and fifth toes of the left foot that does not demonstrate any signs of bacterial infection.  Radiographs reviewed today from the first metatarsophalangeal joint of the left foot demonstrates severe osteoarthritic change severe spurring joint space narrowing.  She has no motion today and with any attempted range of motion is exquisitely painful.  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  Plan: Discussed etiology pathology conservative versus surgical therapies.  At this point I went ahead and consented her for a Keller arthroplasty with a single silicone implant left I also consented her for a syndactylization of the fourth and fifth toes of the left foot.  I answered all the questions regarding these procedures to the best my ability in layman's terms.  She understands and is amenable to it.  She also understands that she needs to keep her blood sugars under control prior to and during recovery.  We did discuss the possible postop  complications which may include but are not limited to postop pain bleeding swelling infection recurrence need further surgery overcorrection under correction loss of digit loss of limb loss of life.  We dispensed a Cam walker today as well as information regarding the surgery center anesthesia group.  I will follow-up with her in the near future for surgical intervention.

## 2018-02-04 NOTE — Patient Instructions (Signed)
Pre-Operative Instructions  Congratulations, you have decided to take an important step towards improving your quality of life.  You can be assured that the doctors and staff at [facility] will be with you every step of the way.  Here are some important things you should know:  1. Plan to be at the surgery center/hospital at least 1 (one) hour prior to your scheduled time, unless otherwise directed by the surgical center/hospital staff.  You must have a responsible adult accompany you, remain during the surgery and drive you home.  Make sure you have directions to the surgical center/hospital to ensure you arrive on time. 2. If you are having surgery at [facility] or [facility] hospitals, you will need a copy of your medical history and physical form from your family physician within one month prior to the date of surgery. We will give you a form for your primary physician to complete.  3. We make every effort to accommodate the date you request for surgery.  However, there are times where surgery dates or times have to be moved.  We will contact you as soon as possible if a change in schedule is required.   4. No aspirin/ibuprofen for one week before surgery.  If you are on aspirin, any non-steroidal anti-inflammatory medications (Mobic, Aleve, Ibuprofen) should not be taken seven (7) days prior to your surgery.  You make take Tylenol for pain prior to surgery.  5. Medications - If you are taking daily heart and blood pressure medications, seizure, reflux, allergy, asthma, anxiety, pain or diabetes medications, make sure you notify the surgery center/hospital before the day of surgery so they can tell you which medications you should take or avoid the day of surgery. 6. No food or drink after midnight the night before surgery unless directed otherwise by surgical center/hospital staff. 7. No alcoholic beverages 24-hours prior to surgery.  No smoking 24-hours prior or 24-hours after  surgery. 8. Wear loose pants or shorts. They should be loose enough to fit over bandages, boots, and casts. 9. Don't wear slip-on shoes. Sneakers are preferred. 10. Bring your boot with you to the surgery center/hospital.  Also bring crutches or a walker if your physician has prescribed it for you.  If you do not have this equipment, it will be provided for you after surgery. 11. If you have not been contacted by the surgery center/hospital by the day before your surgery, call to confirm the date and time of your surgery. 12. Leave-time from work may vary depending on the type of surgery you have.  Appropriate arrangements should be made prior to surgery with your employer. 13. Prescriptions will be provided immediately following surgery by your doctor.  Fill these as soon as possible after surgery and take the medication as directed. Pain medications will not be refilled on weekends and must be approved by the doctor. 14. Remove nail polish on the operative foot and avoid getting pedicures prior to surgery. 15. Wash the night before surgery.  The night before surgery wash the foot and leg well with water and the antibacterial soap provided. Be sure to pay special attention to beneath the toenails and in between the toes.  Wash for at least three (3) minutes. Rinse thoroughly with water and dry well with a towel.  Perform this wash unless told not to do so by your physician.  Enclosed: 1 Ice pack (please put in freezer the night before surgery)   1 Hibiclens skin cleaner     Pre-op instructions  If you have any questions regarding the instructions, please do not hesitate to call our office.  : [address], , [place] [address] -- [phone]  [place]: [address] [place], [place] [address] -- [phone]  [place]: [address]  [place], [place] [address] -- [phone]  [place]: [address], [address], [place], [place] [address] -- [phone]  Website:  [email or web address] 

## 2018-02-14 ENCOUNTER — Encounter: Payer: Self-pay | Admitting: Podiatry

## 2018-02-20 ENCOUNTER — Ambulatory Visit: Payer: Commercial Managed Care - PPO | Admitting: Family Medicine

## 2018-02-24 ENCOUNTER — Ambulatory Visit: Payer: Commercial Managed Care - PPO | Admitting: Family Medicine

## 2018-02-24 ENCOUNTER — Encounter: Payer: Self-pay | Admitting: Family Medicine

## 2018-02-24 VITALS — BP 138/80 | HR 77 | Temp 98.5°F | Ht 63.0 in | Wt 191.8 lb

## 2018-02-24 DIAGNOSIS — I10 Essential (primary) hypertension: Secondary | ICD-10-CM | POA: Diagnosis not present

## 2018-02-24 DIAGNOSIS — F419 Anxiety disorder, unspecified: Secondary | ICD-10-CM | POA: Insufficient documentation

## 2018-02-24 DIAGNOSIS — F4321 Adjustment disorder with depressed mood: Secondary | ICD-10-CM

## 2018-02-24 DIAGNOSIS — J069 Acute upper respiratory infection, unspecified: Secondary | ICD-10-CM | POA: Diagnosis not present

## 2018-02-24 NOTE — Assessment & Plan Note (Signed)
Unexpectedly lost daughter right after she gave birth (? Blood clot)  The father of the baby "took off"  So far - raising 66 day old baby alone and grieving  Doing as well as can be expected  Disc imp of support (sister)  Offered counseling at any time if needed

## 2018-02-24 NOTE — Patient Instructions (Addendum)
No change in blood pressure medicine (better on 2nd check)  Drink lots of fluids and get rest when you can  Have your sister help with the baby as much as possible   If you develop sinus pain or green nasal discharge or fever please call and let me know

## 2018-02-24 NOTE — Assessment & Plan Note (Signed)
Improved (despite severe stressors this week) bp in fair control at this time  BP Readings from Last 1 Encounters:  02/24/18 138/80   No changes needed-continue losartan 50 mg  Most recent labs reviewed  Disc lifstyle change with low sodium diet and exercise

## 2018-02-24 NOTE — Progress Notes (Signed)
Subjective:    Patient ID: [name], female    DOB: [birth year], 57 y.o.   MRN: [id number]  HPI Here for BP visit   Wt Readings from Last 3 Encounters:  02/24/18 191 lb 12 oz (87 kg)  01/30/18 190 lb (86.2 kg)  12/22/17 194 lb 12.8 oz (88.4 kg)   33.97 kg/m   Last visit bp was elevated -thought it was due to illness and cold medication  Was lower at home  BP Readings from Last 3 Encounters:  02/24/18 (!) 144/86  01/30/18 (!) 156/94  12/22/17 (!) 142/94  better on 2nd check  138/80    Takes losartan 50 mg daily   Very stressed  Her daughter died after childbirth  ? A blood clot / or MI  She was prev healthy with a healthy pregnancy   Took abx for uri- finished abx (bactrim)  Cough is persistent  Review of Systems  Constitutional: Positive for fatigue. Negative for activity change, appetite change, chills, diaphoresis, fever and unexpected weight change.  HENT: Positive for congestion and postnasal drip. Negative for ear pain, rhinorrhea, sinus pressure, sore throat and voice change.   Eyes: Negative for pain, redness and visual disturbance.  Respiratory: Positive for cough. Negative for shortness of breath, wheezing and stridor.   Cardiovascular: Negative for chest pain and palpitations.  Gastrointestinal: Negative for abdominal pain, blood in stool, constipation and diarrhea.  Endocrine: Negative for polydipsia and polyuria.  Genitourinary: Negative for dysuria, frequency and urgency.  Musculoskeletal: Positive for arthralgias. Negative for back pain and myalgias.       Foot pain Having surgery soon  Skin: Negative for pallor and rash.  Allergic/Immunologic: Negative for environmental allergies.  Neurological: Negative for dizziness, syncope and headaches.  Hematological: Negative for adenopathy. Does not bruise/bleed easily.  Psychiatric/Behavioral: Positive for dysphoric mood. Negative for decreased concentration. The patient is nervous/anxious.      Recent death of daughter -unexpected        Objective:   Physical Exam  Constitutional: She appears well-developed and well-nourished. No distress.  Well appearing   HENT:  Head: Normocephalic and atraumatic.  Right Ear: External ear normal.  Left Ear: External ear normal.  Mouth/Throat: Oropharynx is clear and moist.  Boggy nares No sinus tenderness  Eyes: Pupils are equal, round, and reactive to light. Conjunctivae and EOM are normal. Right eye exhibits no discharge. Left eye exhibits no discharge. No scleral icterus.  Neck: Normal range of motion. Neck supple. No JVD present. Carotid bruit is not present. No thyromegaly present.  Cardiovascular: Normal rate, regular rhythm, normal heart sounds and intact distal pulses. Exam reveals no gallop.  Pulmonary/Chest: Effort normal and breath sounds normal. No stridor. No respiratory distress. She has no wheezes. She has no rales.  No crackles  Good air exch No rales or rhonchi   Abdominal: She exhibits no abdominal bruit.  Musculoskeletal: She exhibits no edema.  Lymphadenopathy:    She has no cervical adenopathy.  Neurological: She is alert. She has normal reflexes.  Skin: Skin is warm and dry. No rash noted.  Psychiatric: Her mood appears anxious. She exhibits a depressed mood.  Discussed recent unexpected loss of daughter and care of a newborn by herself           Assessment & Plan:   Problem List Items Addressed This Visit      Cardiovascular and Mediastinum   Essential hypertension - Primary    Improved (despite severe stressors this week) bp  in fair control at this time  BP Readings from Last 1 Encounters:  02/24/18 138/80   No changes needed-continue losartan 50 mg  Most recent labs reviewed  Disc lifstyle change with low sodium diet and exercise           Respiratory   Upper respiratory infection    Improved with residual cough  No s/s of bacterial infection  Reassuring exam  Continue to tx  symptoms Update if no further improvement         Other   Grief    Unexpectedly lost daughter right after she gave birth (? Blood clot)  The father of the baby "took off"  So far - raising 30 day old baby alone and grieving  Doing as well as can be expected  Disc imp of support (sister)  Offered counseling at any time if needed

## 2018-02-24 NOTE — Assessment & Plan Note (Signed)
Improved with residual cough  No s/s of bacterial infection  Reassuring exam  Continue to tx symptoms Update if no further improvement

## 2018-03-10 ENCOUNTER — Telehealth: Payer: Self-pay | Admitting: Podiatry

## 2018-03-10 NOTE — Telephone Encounter (Signed)
I'm calling about my foot surgery that I thought I was supposed to have on the 13 th of this month. I just need to know what time. My number is [phone]. Thank you.

## 2018-03-16 ENCOUNTER — Other Ambulatory Visit: Payer: Self-pay | Admitting: Podiatry

## 2018-03-16 DIAGNOSIS — L852 Keratosis punctata (palmaris et plantaris): Secondary | ICD-10-CM

## 2018-03-16 DIAGNOSIS — M2022 Hallux rigidus, left foot: Secondary | ICD-10-CM | POA: Diagnosis not present

## 2018-03-16 MED ORDER — CLINDAMYCIN HCL 150 MG PO CAPS: 150.0000 mg | ORAL_CAPSULE | Freq: Three times a day (TID) | ORAL | 0 refills | Status: DC

## 2018-03-16 MED ORDER — OXYCODONE-ACETAMINOPHEN 10-325 MG PO TABS: 1.0000 | ORAL_TABLET | Freq: Four times a day (QID) | ORAL | 0 refills | Status: AC | PRN

## 2018-03-16 MED ORDER — ONDANSETRON HCL 4 MG PO TABS: 4.0000 mg | ORAL_TABLET | Freq: Three times a day (TID) | ORAL | 0 refills | Status: DC | PRN

## 2018-03-17 ENCOUNTER — Encounter: Payer: Self-pay | Admitting: Family Medicine

## 2018-03-17 DIAGNOSIS — M79676 Pain in unspecified toe(s): Secondary | ICD-10-CM

## 2018-03-23 ENCOUNTER — Encounter: Payer: Self-pay | Admitting: Podiatry

## 2018-03-23 ENCOUNTER — Ambulatory Visit (INDEPENDENT_AMBULATORY_CARE_PROVIDER_SITE_OTHER): Payer: Commercial Managed Care - PPO

## 2018-03-23 ENCOUNTER — Ambulatory Visit (INDEPENDENT_AMBULATORY_CARE_PROVIDER_SITE_OTHER): Payer: Commercial Managed Care - PPO | Admitting: Podiatry

## 2018-03-23 VITALS — BP 150/83 | HR 86 | Temp 98.3°F | Resp 16

## 2018-03-23 DIAGNOSIS — M2022 Hallux rigidus, left foot: Secondary | ICD-10-CM

## 2018-03-23 DIAGNOSIS — Z9889 Other specified postprocedural states: Secondary | ICD-10-CM

## 2018-03-23 MED ORDER — OXYCODONE-ACETAMINOPHEN 10-325 MG PO TABS: 1.0000 | ORAL_TABLET | Freq: Four times a day (QID) | ORAL | 0 refills | Status: AC | PRN

## 2018-03-23 NOTE — Progress Notes (Signed)
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  She states that she has some discomfort major pain is only about a 6 out of 10 she is most the time is just bothersome.  She denies fever chills nausea vomiting muscle aches and pains denies chest pain calf pain back pain shortness of breath and headache.  Objective: Presents today using a 4 prong walker to the right hand cam walker to the left foot ambulating slowly.  Vital signs are stable alert and oriented x3.  Pulses are palpable once a dry sterile dressing was removed demonstrating mild edema no erythema cellulitis drainage or odor radiographs taken today demonstrate a Keller arthroplasty with a single silicone implant in good position.  Syndactylization of fourth toe left.  No signs of infection.  Assessment: Well-healing surgical foot left.  Plan: Discussed etiology pathology and surgical therapies went ahead and replaced dry sterile dressing today she will keep this on for the next week or 2 I will follow-up with her at that time for suture removal fourth and fifth toes.

## 2018-04-01 DIAGNOSIS — E119 Type 2 diabetes mellitus without complications: Secondary | ICD-10-CM | POA: Diagnosis not present

## 2018-04-08 ENCOUNTER — Ambulatory Visit (INDEPENDENT_AMBULATORY_CARE_PROVIDER_SITE_OTHER): Payer: Commercial Managed Care - PPO | Admitting: Podiatry

## 2018-04-08 ENCOUNTER — Encounter: Payer: Self-pay | Admitting: Podiatry

## 2018-04-08 DIAGNOSIS — M2022 Hallux rigidus, left foot: Secondary | ICD-10-CM

## 2018-04-08 NOTE — Progress Notes (Signed)
She presents for second postop visit today date of surgery 03/16/2018 status post Jake Michaelis bunionectomy with implant left.  Webbing of the fourth and fifth toes left.  She denies fever chills nausea vomiting states that his nails pretty painful.  Objective: Dry sterile dressing intact was removed demonstrates no erythema edema cellulitis drainage odor good range of motion of the first metatarsophalangeal joint though somewhat tender.  Sutures are intact I went ahead and remove the sutures today margins remain well coapted I see no signs of infection.  Assessment: Well-healing surgical foot.  Plan: Encourage range of motion exercises follow-up with her in 1 to 2 weeks.

## 2018-04-09 LAB — HM DIABETES EYE EXAM

## 2018-04-27 ENCOUNTER — Encounter: Payer: Commercial Managed Care - PPO | Admitting: Podiatry

## 2018-04-29 ENCOUNTER — Ambulatory Visit (INDEPENDENT_AMBULATORY_CARE_PROVIDER_SITE_OTHER): Payer: Commercial Managed Care - PPO | Admitting: Podiatry

## 2018-04-29 ENCOUNTER — Ambulatory Visit (INDEPENDENT_AMBULATORY_CARE_PROVIDER_SITE_OTHER): Payer: Commercial Managed Care - PPO

## 2018-04-29 ENCOUNTER — Encounter: Payer: Self-pay | Admitting: Podiatry

## 2018-04-29 DIAGNOSIS — M2022 Hallux rigidus, left foot: Secondary | ICD-10-CM

## 2018-04-29 DIAGNOSIS — Z9889 Other specified postprocedural states: Secondary | ICD-10-CM

## 2018-04-29 DIAGNOSIS — L84 Corns and callosities: Secondary | ICD-10-CM

## 2018-04-29 NOTE — Progress Notes (Addendum)
She presents today date of surgery 03/16/2018 status post Jake Michaelis bunion implant with webbing of the toes fourth and fifth left she states that she has severe pain in the skin at the webbing site and she is concerned that she has a grinding sensation around the first metatarsophalangeal joint when she moves it with her hands.  She is also concerned that the second toe seems to be encroaching plantarly and medially upon the hallux.  Objective: Vital signs are stable she alert and oriented x3.  She still has tenderness on range of motion with no crepitation first metatarsophalangeal joint left.  They fourth and fifth toe has gone on to heal uneventfully I see no erythema cellulitis drainage odor no signs of infection no signs of pain other than tenderness at the inferior aspect of the incision line which is healed completely and you cannot even see it.  Radiographs taken today do demonstrate a rectus first metatarsophalangeal joint with single silicone implant and grommets.  They appear to be in good position.  She has some medial deviation of the second metatarsal phalangeal joint and the second toe more than likely secondary to the bunion deformity she had previously and is now resulting in some medial deviation of the toe.  Hopefully when the swelling goes down this will abate.  Assessment: Well-healing surgical foot.  Plan: Follow-up with her in about a month to 6 weeks.  She will have to remain out of work probably for another 6 to 8 weeks anyway just to make sure that this heals well because at this point with the tenderness in the first metatarsophalangeal joint and in the fifth toe area she will not be able to wear regular shoe gear though I am requesting that she try that over the next month.

## 2018-04-30 ENCOUNTER — Encounter: Payer: Self-pay | Admitting: Podiatry

## 2018-05-02 DIAGNOSIS — E119 Type 2 diabetes mellitus without complications: Secondary | ICD-10-CM | POA: Diagnosis not present

## 2018-05-11 ENCOUNTER — Encounter: Payer: Commercial Managed Care - PPO | Admitting: Podiatry

## 2018-05-13 ENCOUNTER — Encounter: Payer: Commercial Managed Care - PPO | Admitting: Podiatry

## 2018-05-13 ENCOUNTER — Telehealth: Payer: Self-pay | Admitting: Family Medicine

## 2018-05-13 NOTE — Telephone Encounter (Signed)
Form in your inbox for review  

## 2018-05-13 NOTE — Telephone Encounter (Signed)
Pt dropped off Wellness results from her job. She states her blood pressure is high and she would like to discuss changing her bp medication. I scheduled her an appt for Monday 05/18/18. I placed the results on Carrie's cart to be delivered to Dr. Glori Bickers.

## 2018-05-14 NOTE — Telephone Encounter (Signed)
I will hold the form and see her then

## 2018-05-18 ENCOUNTER — Encounter: Payer: Self-pay | Admitting: Family Medicine

## 2018-05-18 ENCOUNTER — Ambulatory Visit: Payer: Commercial Managed Care - PPO | Admitting: Family Medicine

## 2018-05-18 VITALS — BP 146/92 | HR 76 | Temp 98.1°F | Ht 63.0 in | Wt 199.2 lb

## 2018-05-18 DIAGNOSIS — E119 Type 2 diabetes mellitus without complications: Secondary | ICD-10-CM | POA: Diagnosis not present

## 2018-05-18 DIAGNOSIS — F419 Anxiety disorder, unspecified: Secondary | ICD-10-CM

## 2018-05-18 DIAGNOSIS — Z6835 Body mass index (BMI) 35.0-35.9, adult: Secondary | ICD-10-CM

## 2018-05-18 DIAGNOSIS — I10 Essential (primary) hypertension: Secondary | ICD-10-CM

## 2018-05-18 MED ORDER — LOSARTAN POTASSIUM 100 MG PO TABS: 100.0000 mg | ORAL_TABLET | Freq: Every day | ORAL | 3 refills | Status: DC

## 2018-05-18 MED ORDER — BUSPIRONE HCL 15 MG PO TABS: 7.5000 mg | ORAL_TABLET | Freq: Two times a day (BID) | ORAL | 5 refills | Status: DC

## 2018-05-18 NOTE — Assessment & Plan Note (Signed)
Worse lately  Forbidden from seeing grand child Much loss Reviewed stressors/ coping techniques/symptoms/ support sources/ tx options and side effects in detail today  Declines counseling right now /urged to check on ins Trial of buspar 7.5 mg bid  Discussed expectations of this medication including time to effectiveness and mechanism of action, also poss of side effects (early and late)- including mental fuzziness, weight or appetite change, nausea and poss of worse dep or anxiety (even suicidal thoughts)  Pt voiced understanding and will stop med and update if this occurs   Will alert if problems or need to titrate F/u 1 mo  Disc imp of self care

## 2018-05-18 NOTE — Assessment & Plan Note (Signed)
bp in fair control at this time  BP Readings from Last 1 Encounters:  05/18/18 (!) 146/92  not controlled Will inc losartan to 100 mg daily  Alert if side eff F/u 1 mo  Work on exercise Most recent labs reviewed  Disc lifstyle change with low sodium diet and exercise

## 2018-05-18 NOTE — Assessment & Plan Note (Signed)
Discussed how this problem influences overall health and the risks it imposes  Reviewed plan for weight loss with lower calorie diet (via better food choices and also portion control or program like weight watchers) and exercise building up to or more than 30 minutes 5 days per week including some aerobic activity    

## 2018-05-18 NOTE — Progress Notes (Signed)
Subjective:    Patient ID: [name], female    DOB: 02-26-1961, 58 y.o.   MRN: [id number]  HPI Here for f/u of HTN   Wt Readings from Last 3 Encounters:  05/18/18 199 lb 4 oz (90.4 kg)  02/24/18 191 lb 12 oz (87 kg)  01/30/18 190 lb (86.2 kg)   35.30 kg/m   last visit here bp was improved with losartan 50 mg -in November (had cough with lisinopril so changed to arb)   She had a screening at work and had bp of 160/98 and 162/104 however  BP Readings from Last 3 Encounters:  05/18/18 (!) 146/92  03/23/18 (!) 150/83  02/24/18 138/80  at home 160s /90s   DM2 Lab Results  Component Value Date   HGBA1C 8.2 (H) 12/19/2017   Went back on metformin at that time  She is doing a monitor at work - her blood sugars have been 80s to low 120s (suspects this is inaccurate)  On another machine - sister/ a different one for insurance  Then 150s/160  Taking metformin  Diet is avoiding junk / no fried  Sugar - no sweets  carbs - no bread/ still eats some pasta but a lot les Has not exercised since foot surgery- did fine   Goes back to work next month  Likely will be able to go back to exercise then   Lost her daughter unexpectedly in nov Father of the baby will not let them see the baby She also lost her father   Talked to a church member for counseling   Very anxious   Patient Active Problem List   Diagnosis Date Noted  . Grief 02/24/2018  . Dysuria 01/30/2018  . 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 Essential hypertension 10/11/2016  . Screening mammogram, encounter for 08/30/2016  . Routine general medical examination at a health care facility 08/30/2016  . Encounter for screening for HIV 08/30/2016  . Need for hepatitis C screening test 08/30/2016  . History of herpes zoster 05/12/2015  . Hyperlipidemia with target low density lipoprotein (LDL) cholesterol less than 100 mg/dL 12/21/2012  . Obesity 12/21/2012  . Vaginal atrophy  12/18/2012  . Dyspepsia 08/20/2012  . Rectal bleeding 12/04/2011  . Lower back pain 12/04/2011  . ELEVATED BLOOD PRESSURE 06/04/2010  . ANEMIA, B12 DEFICIENCY 10/05/2008  . THROMBOCYTOPENIA 10/05/2008  . Allergic rhinitis 10/05/2008  . Chronic headache 10/05/2008  . FREQUENCY, URINARY 10/05/2008  . Type 2 diabetes mellitus without complications ([place]) 27/05/5007  . ANEMIA, IRON DEFICIENCY 01/26/2008   Past Medical History:  Diagnosis Date  . Anal intraepithelial neoplasia II (AIN II)   . Arthritis    shoulder, feet  . B12 deficiency   . Essential hypertension    followed by pcp--- currently no meds,  states when she stopped sinus medication otc , bp better  . High frequency hearing loss   . History of abnormal Pap smear   . History of uterine leiomyoma   . Hx of migraine headaches   . Iron deficiency anemia   . Type 2 diabetes mellitus ([place])    followed by pcp   Past Surgical History:  Procedure Laterality Date  . BUNIONECTOMY Bilateral 1982  . CARDIOVASCULAR STRESS TEST  09-19-2010   @ [facility]   normal nuclear study w/ no evidence ishemia/  normal LV funciton and wall motion , ef 81%  . COLONOSCOPY WITH ESOPHAGOGASTRODUODENOSCOPY (EGD)  last one 08-21-2017  . HIGH RESOLUTION  ANOSCOPY N/A 10/09/2017   Procedure: HIGH RESOLUTION ANOSCOPY,  BIOPSY  EXCISION;  Surgeon: Leighton Ruff, MD;  Location: [facility];  Service: General;  Laterality: N/A;  . LAPAROSCOPIC ASSISTED VAGINAL HYSTERECTOMY  09-18-2005    dr Earvin Hansen  . LEEP    . MYOMECTOMY ABDOMINAL APPROACH  09-04-2001    dr [facility]   w/ lysis adhesions  . NASAL ENDOSCOPY  03/2007   neg  . TONSILLECTOMY  child   Social History   Tobacco Use  . Smoking status: Never Smoker  . Smokeless tobacco: Never Used  Substance Use Topics  . Alcohol use: Yes    Comment: occasional wine  . Drug use: No   Family History  Problem Relation Age of Onset  . Heart disease Sister   . Arthritis Sister   . Diabetes  Sister   . Hypertension Sister   . Diabetes Maternal Grandmother   . Hypertension Maternal Grandmother   . Lung cancer Father   . Colon cancer Father   . Sudden death Daughter        after giving birth / ? if blood clot  . Breast cancer Neg Hx   . Esophageal cancer Neg Hx   . Stomach cancer Neg Hx   . Rectal cancer Neg Hx    Allergies  Allergen Reactions  . Lisinopril Cough  . Amoxicillin-Pot Clavulanate Rash    REACTION: Itchy rash   Current Outpatient Medications on File Prior to Visit  Medication Sig Dispense Refill  . acetaminophen (TYLENOL) 500 MG tablet Take 500 mg by mouth every 6 (six) hours as needed.    . fluticasone (FLONASE) 50 MCG/ACT nasal spray Place 2 sprays into both nostrils daily. 16 g 11  . metFORMIN (GLUCOPHAGE) 500 MG tablet Take 1 tablet (500 mg total) by mouth 2 (two) times daily with a meal. 60 tablet 11   No current facility-administered medications on file prior to visit.      Review of Systems  Constitutional: Positive for fatigue. Negative for activity change, appetite change, fever and unexpected weight change.  HENT: Negative for congestion, ear pain, rhinorrhea, sinus pressure and sore throat.   Eyes: Negative for pain, redness and visual disturbance.  Respiratory: Negative for cough, shortness of breath and wheezing.   Cardiovascular: Negative for chest pain and palpitations.  Gastrointestinal: Negative for abdominal pain, blood in stool, constipation and diarrhea.  Endocrine: Negative for polydipsia and polyuria.  Genitourinary: Negative for dysuria, frequency and urgency.  Musculoskeletal: Negative for arthralgias, back pain and myalgias.  Skin: Negative for pallor and rash.  Allergic/Immunologic: Negative for environmental allergies.  Neurological: Positive for headaches. Negative for dizziness and syncope.  Hematological: Negative for adenopathy. Does not bruise/bleed easily.  Psychiatric/Behavioral: Negative for decreased concentration,  dysphoric mood, self-injury, sleep disturbance and suicidal ideas. The patient is nervous/anxious.        Objective:   Physical Exam Constitutional:      General: She is not in acute distress.    Appearance: Normal appearance. She is well-developed. She is obese. She is not ill-appearing.  HENT:     Head: Normocephalic and atraumatic.     Mouth/Throat:     Mouth: Mucous membranes are moist.     Pharynx: Oropharynx is clear.  Eyes:     General: No scleral icterus.    Extraocular Movements: Extraocular movements intact.     Conjunctiva/sclera: Conjunctivae normal.     Pupils: Pupils are equal, round, and reactive to light.  Neck:     Musculoskeletal: Normal range of motion and neck supple.     Thyroid: No thyromegaly.     Vascular: No carotid bruit or JVD.  Cardiovascular:     Rate and Rhythm: Normal rate and regular rhythm.     Pulses: Normal pulses.     Heart sounds: Normal heart sounds. No gallop.   Pulmonary:     Effort: Pulmonary effort is normal. No respiratory distress.     Breath sounds: Normal breath sounds. No wheezing or rales.  Abdominal:     General: Bowel sounds are normal. There is no distension or abdominal bruit.     Palpations: Abdomen is soft. There is no mass.     Tenderness: There is no abdominal tenderness.  Musculoskeletal:     Right lower leg: No edema.     Left lower leg: No edema.  Lymphadenopathy:     Cervical: No cervical adenopathy.  Skin:    General: Skin is warm and dry.     Coloration: Skin is not pale.     Findings: No rash.  Neurological:     Mental Status: She is alert. Mental status is at baseline.     Coordination: Coordination normal.     Deep Tendon Reflexes: Reflexes are normal and symmetric. Reflexes normal.  Psychiatric:        Mood and Affect: Mood is anxious.        Speech: Speech normal.        Behavior: Behavior normal.        Thought Content: Thought content normal.        Cognition and Memory: Cognition normal.      Comments: Anxious  Disc symptoms candidly           Assessment & Plan:   Problem List Items Addressed This Visit      Cardiovascular and Mediastinum   Essential hypertension - Primary    bp in fair control at this time  BP Readings from Last 1 Encounters:  05/18/18 (!) 146/92  not controlled Will inc losartan to 100 mg daily  Alert if side eff F/u 1 mo  Work on exercise Most recent labs reviewed  Disc lifstyle change with low sodium diet and exercise        Relevant Medications   losartan (COZAAR) 100 MG tablet     Endocrine   Type 2 diabetes mellitus without complications (HCC)    X9J today  Was given a faulty glucometer at work- will try to get a new one  Working hard on diet Able to start adding exercise soon (had foot surgery)       Relevant Medications   losartan (COZAAR) 100 MG tablet   Other Relevant Orders   POCT glycosylated hemoglobin (Hb A1C)     Other   Obesity    Discussed how this problem influences overall health and the risks it imposes  Reviewed plan for weight loss with lower calorie diet (via better food choices and also portion control or program like weight watchers) and exercise building up to or more than 30 minutes 5 days per week including some aerobic activity         Anxiety    Worse lately  Forbidden from seeing grand child Much loss Reviewed stressors/ coping techniques/symptoms/ support sources/ tx options and side effects in detail today  Declines counseling right now /urged to check on ins Trial of buspar 7.5 mg bid  Discussed expectations of this medication including  time to effectiveness and mechanism of action, also poss of side effects (early and late)- including mental fuzziness, weight or appetite change, nausea and poss of worse dep or anxiety (even suicidal thoughts)  Pt voiced understanding and will stop med and update if this occurs   Will alert if problems or need to titrate F/u 1 mo  Disc imp of self care         Relevant Medications   busPIRone (BUSPAR) 15 MG tablet

## 2018-05-18 NOTE — Assessment & Plan Note (Signed)
A1C today  Was given a faulty glucometer at work- will try to get a new one  Working hard on diet Able to start adding exercise soon (had foot surgery)

## 2018-05-18 NOTE — Patient Instructions (Addendum)
We will check A1C on the way out   Increase losartan dose to 100 mg   Continue a diabetic diet  Start exercise when you are cleared to do so  Also getting back to work should help   Try buspar 7.5 mg twice daily (1/2 of a 15 mg pill twice daily)  If any intolerable side effects let us know   Please think hard about counseling- let us know if you need a referral   Follow up in 1 month please

## 2018-05-25 ENCOUNTER — Other Ambulatory Visit: Payer: Self-pay | Admitting: Family Medicine

## 2018-05-31 ENCOUNTER — Encounter: Payer: Self-pay | Admitting: Family Medicine

## 2018-05-31 DIAGNOSIS — E119 Type 2 diabetes mellitus without complications: Secondary | ICD-10-CM | POA: Diagnosis not present

## 2018-06-01 ENCOUNTER — Encounter: Payer: Self-pay | Admitting: Podiatry

## 2018-06-01 ENCOUNTER — Ambulatory Visit (INDEPENDENT_AMBULATORY_CARE_PROVIDER_SITE_OTHER): Payer: Commercial Managed Care - PPO | Admitting: Podiatry

## 2018-06-01 ENCOUNTER — Ambulatory Visit (INDEPENDENT_AMBULATORY_CARE_PROVIDER_SITE_OTHER): Payer: Commercial Managed Care - PPO

## 2018-06-01 DIAGNOSIS — M2022 Hallux rigidus, left foot: Secondary | ICD-10-CM

## 2018-06-01 DIAGNOSIS — Z9889 Other specified postprocedural states: Secondary | ICD-10-CM

## 2018-06-01 DIAGNOSIS — L84 Corns and callosities: Secondary | ICD-10-CM

## 2018-06-01 MED ORDER — DICLOFENAC SODIUM 1 % TD GEL: 4.0000 g | Freq: Four times a day (QID) | TRANSDERMAL | 2 refills | Status: AC

## 2018-06-01 NOTE — Progress Notes (Signed)
She presents today date of surgery 03/16/2018 status post Jake Michaelis bunionectomy with single silicone cone implant and syndactylization of fourth and fifth toes.  She states that is doing better but sometimes have a nagging ache in my toe and the side of my foot seems to hurt more.  States that she feels like she is walking to the outside of the foot and is becoming more painful.  Objective: Vital signs are stable she alert oriented x3.  Pulses are palpable.  There is no erythema to some mild edema no cellulitis drainage or odor.  Has tenderness on palpation of the fifth metatarsal and on range of motion of the first metatarsophalangeal joint stating that is tender and there.  Radiographs taken today demonstrate a well-healing first metatarsal phalangeal joint arthroplasty with a single silicone implant and grommets.  Assessment: Well-healing surgical foot most likely painful still due to scar tissue within the joint and lateralization or lateral compensatory syndrome.  Plan: At this point it is essential that she consider physical therapy that we were hoping that she would not have to go that route.  Regarding get her into physical therapy across town at [facility].  I will follow-up with her in 1 month but in all likelihood it will be at least 2 months before she goes back to work.

## 2018-06-04 DIAGNOSIS — M79675 Pain in left toe(s): Secondary | ICD-10-CM | POA: Diagnosis not present

## 2018-06-14 ENCOUNTER — Encounter: Payer: Self-pay | Admitting: Family Medicine

## 2018-06-15 MED ORDER — AMLODIPINE BESYLATE 5 MG PO TABS: 5.0000 mg | ORAL_TABLET | Freq: Every day | ORAL | 3 refills | Status: DC

## 2018-06-17 ENCOUNTER — Ambulatory Visit: Payer: Commercial Managed Care - PPO | Admitting: Orthotics

## 2018-06-17 ENCOUNTER — Other Ambulatory Visit: Payer: Self-pay

## 2018-06-17 DIAGNOSIS — E119 Type 2 diabetes mellitus without complications: Secondary | ICD-10-CM

## 2018-06-17 DIAGNOSIS — M2022 Hallux rigidus, left foot: Secondary | ICD-10-CM

## 2018-06-17 NOTE — Progress Notes (Signed)
Patient is here today to be evaluated and cast for CMFO.  Patient has hx of functional hallux limitus (FHL), and needs a supportive orthoses that will plantarflex first ray in order to lower hinge pin of first MPJ and enhance windless effect.  Plan of deep heel cup, hug arch, and reverse mortons extension.  Richy to fab.  

## 2018-06-30 ENCOUNTER — Telehealth: Payer: Self-pay | Admitting: Family Medicine

## 2018-06-30 NOTE — Telephone Encounter (Signed)
I left a detailed message on patient's voice mail to call back and schedule a lab only appointment to check her A1C.

## 2018-07-01 DIAGNOSIS — E119 Type 2 diabetes mellitus without complications: Secondary | ICD-10-CM | POA: Diagnosis not present

## 2018-07-08 ENCOUNTER — Ambulatory Visit (INDEPENDENT_AMBULATORY_CARE_PROVIDER_SITE_OTHER): Payer: Commercial Managed Care - PPO | Admitting: Podiatry

## 2018-07-08 ENCOUNTER — Encounter: Payer: Self-pay | Admitting: Podiatry

## 2018-07-08 ENCOUNTER — Other Ambulatory Visit: Payer: Self-pay

## 2018-07-08 ENCOUNTER — Ambulatory Visit (INDEPENDENT_AMBULATORY_CARE_PROVIDER_SITE_OTHER): Payer: Commercial Managed Care - PPO

## 2018-07-08 DIAGNOSIS — L84 Corns and callosities: Secondary | ICD-10-CM

## 2018-07-08 DIAGNOSIS — Z9889 Other specified postprocedural states: Secondary | ICD-10-CM | POA: Diagnosis not present

## 2018-07-08 DIAGNOSIS — M2022 Hallux rigidus, left foot: Secondary | ICD-10-CM | POA: Diagnosis not present

## 2018-07-08 NOTE — Progress Notes (Signed)
Presents today date of surgery 03/16/2018 status post Jake Michaelis bunion with a single silicone implant and grommets.  Webbing or syndactylization of the fourth and fifth toes of the left foot.  She states that sometimes it seems to be better and other times not as good.  Objective: Vital signs are stable alert and oriented x3.  Pulses are palpable.  Neurologic sensorium is intact.  Degenerative flexors are intact still has considerable swelling around the first metatarsal phalangeal joint of the left foot with limited range of motion and tenderness on palpation.  Ranges of motion are good but there is still some clicking and popping and some pain.  Radiographs taken today demonstrate well-placed grommets no fractures are identified.  No acute findings.  Assessment: Slowly healing surgical foot left.  Plan: We will request that she continue exercising and continuing trying to progress.  She will probably need to be out of work until at least June probably another 6 weeks or so to alleviate current symptoms I will follow-up with her in about 4 weeks.

## 2018-08-19 ENCOUNTER — Ambulatory Visit: Payer: Commercial Managed Care - PPO | Admitting: Podiatry

## 2018-08-19 ENCOUNTER — Encounter: Payer: Self-pay | Admitting: Podiatry

## 2018-08-19 ENCOUNTER — Ambulatory Visit: Payer: Commercial Managed Care - PPO

## 2018-08-19 ENCOUNTER — Ambulatory Visit (INDEPENDENT_AMBULATORY_CARE_PROVIDER_SITE_OTHER): Payer: Commercial Managed Care - PPO | Admitting: Podiatry

## 2018-08-19 ENCOUNTER — Other Ambulatory Visit: Payer: Self-pay

## 2018-08-19 VITALS — Temp 97.0°F

## 2018-08-19 DIAGNOSIS — Z9889 Other specified postprocedural states: Secondary | ICD-10-CM

## 2018-08-19 DIAGNOSIS — M778 Other enthesopathies, not elsewhere classified: Secondary | ICD-10-CM

## 2018-08-19 DIAGNOSIS — M779 Enthesopathy, unspecified: Secondary | ICD-10-CM | POA: Diagnosis not present

## 2018-08-19 DIAGNOSIS — M2022 Hallux rigidus, left foot: Secondary | ICD-10-CM | POA: Diagnosis not present

## 2018-08-19 MED ORDER — METHYLPREDNISOLONE 4 MG PO TBPK: ORAL_TABLET | ORAL | 0 refills | Status: DC

## 2018-08-19 NOTE — Progress Notes (Signed)
She presents today date of surgery 03/16/2018 status post Jake Michaelis bunionectomy left with a fourth fifth syndactylization left.  States that everything is doing great has a little bit of hypersensitivity to the dorsal medial aspect of the first metatarsal phalangeal joint some swelling to the first intermetatarsal space but great range of motion of the first metatarsal she says he is also states that she is having some pain around the second metatarsal phalangeal joint area as she points to the area.  Objective: Vital signs are stable she alert oriented x3 mild edema to the first intermetatarsal space tenderness on palpation and range of motion of the second metatarsophalangeal joint particularly on end range of motion dorsally and plantarly.  She has allodynic type pain along the course of the dorsal medial cutaneous nerve.  Assessment: Well-healing surgical foot with some mild neuritis of the medial dorsal cutaneous nerve and capsulitis of the second metatarsal phalangeal joint.  Plan: I recommend that she stay out of work for another week to 2weeks while she is taking an anti-inflammatory pack.  I will put her on a Medrol Dosepak hopefully this will help alleviate her symptoms of the neuritis and as well as the capsulitis we did discuss appropriate shoe gear with a stiff soled shoe she understands that and is amenable to it we will follow-up with me in the near future.

## 2018-10-19 ENCOUNTER — Encounter: Payer: Commercial Managed Care - PPO | Admitting: Podiatry

## 2018-10-28 ENCOUNTER — Ambulatory Visit: Payer: Commercial Managed Care - PPO | Admitting: Orthotics

## 2018-10-28 ENCOUNTER — Encounter: Payer: Self-pay | Admitting: Podiatry

## 2018-10-28 ENCOUNTER — Ambulatory Visit (INDEPENDENT_AMBULATORY_CARE_PROVIDER_SITE_OTHER): Payer: Commercial Managed Care - PPO | Admitting: Podiatry

## 2018-10-28 ENCOUNTER — Other Ambulatory Visit: Payer: Self-pay

## 2018-10-28 VITALS — Temp 98.3°F

## 2018-10-28 DIAGNOSIS — M7752 Other enthesopathy of left foot: Secondary | ICD-10-CM

## 2018-10-28 DIAGNOSIS — T148XXA Other injury of unspecified body region, initial encounter: Secondary | ICD-10-CM

## 2018-10-28 NOTE — Progress Notes (Signed)
She presents today states that she is having lateral foot pain left and ankle pain x3 to 4 weeks states that is doing much better now than it was.  Objective: Vital signs are stable she alert and oriented x3.  Pulses are palpable.  Neurologic sensorium is intact dT reflexes are intact and strength is normal symmetrical she has pain on palpation of the sinus tarsi left foot.  She has pain on inversion and eversion of the subtalar joint.  She still has pain on palpation second metatarsal phalangeal joint of the left foot.  Pain on end range of motion of the joint medial dislocation of the joint.  Assessment: Subtalar joint capsulitis left capsulitis with probable tear of the plantar plate second metatarsal phalangeal joint left.  Plan: Discussed etiology pathology conservative or surgical therapies at this point performed an injection to the sinus tarsi of the left foot she Renae Fickle to help augment the orthotics and were going to request an MRI of the forefoot left.

## 2018-10-28 NOTE — Progress Notes (Signed)
Added valgus RF wedge to bottom of f/o, follow up with me next week to make permanent.

## 2018-10-30 ENCOUNTER — Telehealth: Payer: Self-pay

## 2018-10-30 DIAGNOSIS — T148XXA Other injury of unspecified body region, initial encounter: Secondary | ICD-10-CM

## 2018-10-30 NOTE — Telephone Encounter (Signed)
MRI has been approved from 10/29/2018 to 11/30/2018 Auth # [id number] Patient was notified via voice mail and instructed to contact scheduling department to set up appt to her convenience

## 2018-10-30 NOTE — Telephone Encounter (Signed)
-----   Message from Rip Harbour, [facility] sent at 10/28/2018  9:08 AM EDT ----- Regarding: MRI MRI left foot - evaluate 2nd MPJ tear with dislocation left - surgical consideration

## 2018-11-10 ENCOUNTER — Ambulatory Visit
Admission: RE | Admit: 2018-11-10 | Discharge: 2018-11-10 | Disposition: A | Discharge status: Home / Self Care | Payer: Commercial Managed Care - PPO | Source: Ambulatory Visit | Attending: Podiatry | Admitting: Podiatry

## 2018-11-10 ENCOUNTER — Other Ambulatory Visit: Payer: Self-pay

## 2018-11-10 DIAGNOSIS — T148XXA Other injury of unspecified body region, initial encounter: Secondary | ICD-10-CM | POA: Diagnosis present

## 2018-11-12 ENCOUNTER — Telehealth: Payer: Self-pay | Admitting: *Deleted

## 2018-11-13 ENCOUNTER — Telehealth: Payer: Self-pay

## 2018-11-13 NOTE — Telephone Encounter (Signed)
MRI disc request has been faxed to [facility] Patient has been notified of delay and will call back in couple of weeks to schedule follow up

## 2018-11-13 NOTE — Telephone Encounter (Signed)
-----   Message from Andres Ege, RN sent at 11/12/2018  4:12 PM EDT ----- Janace Hoard, please assist. Marcy Siren ----- Message ----- From: Cedric Fishman Sent: 11/11/2018  12:59 PM EDT To: Andres Ege, RN  Send for an over read.

## 2018-11-19 ENCOUNTER — Telehealth: Payer: Self-pay

## 2018-11-19 NOTE — Telephone Encounter (Signed)
MRI disc mailed to [facility]

## 2018-11-19 NOTE — Telephone Encounter (Signed)
-----   Message from Andres Ege, RN sent at 11/12/2018  4:12 PM EDT ----- Janace Hoard, please assist. Marcy Siren ----- Message ----- From: Cedric Fishman Sent: 11/11/2018  12:59 PM EDT To: Andres Ege, RN  Send for an over read.

## 2018-12-01 ENCOUNTER — Encounter: Payer: Self-pay | Admitting: Podiatry

## 2018-12-02 NOTE — Telephone Encounter (Signed)
Entered in error

## 2018-12-03 ENCOUNTER — Ambulatory Visit (INDEPENDENT_AMBULATORY_CARE_PROVIDER_SITE_OTHER): Payer: Commercial Managed Care - PPO

## 2018-12-03 DIAGNOSIS — Z23 Encounter for immunization: Secondary | ICD-10-CM

## 2018-12-18 ENCOUNTER — Other Ambulatory Visit: Payer: Self-pay | Admitting: *Deleted

## 2018-12-18 MED ORDER — FLUTICASONE PROPIONATE 50 MCG/ACT NA SUSP: 2.0000 | Freq: Every day | NASAL | 2 refills | Status: DC

## 2018-12-24 ENCOUNTER — Other Ambulatory Visit: Payer: Self-pay | Admitting: *Deleted

## 2018-12-24 MED ORDER — METFORMIN HCL 500 MG PO TABS: 500.0000 mg | ORAL_TABLET | Freq: Two times a day (BID) | ORAL | 2 refills | Status: DC

## 2019-01-11 ENCOUNTER — Ambulatory Visit: Payer: Commercial Managed Care - PPO | Admitting: Podiatry

## 2019-03-19 ENCOUNTER — Other Ambulatory Visit: Payer: Self-pay | Admitting: Family Medicine

## 2019-03-29 ENCOUNTER — Ambulatory Visit: Payer: HRSA Program | Attending: Internal Medicine

## 2019-03-29 DIAGNOSIS — Z20822 Contact with and (suspected) exposure to covid-19: Secondary | ICD-10-CM

## 2019-03-29 DIAGNOSIS — Z20828 Contact with and (suspected) exposure to other viral communicable diseases: Secondary | ICD-10-CM | POA: Insufficient documentation

## 2019-03-31 LAB — NOVEL CORONAVIRUS, NAA: SARS-CoV-2, NAA: NOT DETECTED

## 2019-12-20 IMAGING — MR MRI OF THE LEFT FOOT WITHOUT CONTRAST
5 series · 40 of 40 positions shown · non-contrast
Comparison: Radiographs [DATE].

CLINICAL DATA: Left toe pain since [REDACTED] following surgery
for joint replacement. No recent injury. Evaluate 2nd MPJ tear with
dislocation.

EXAM:
MRI OF THE LEFT FOOT WITHOUT CONTRAST
TECHNIQUE: Multiplanar, multisequence MR imaging of the left forefoot was
performed. No intravenous contrast was administered.

[Series 4: T1 · coronal · left · 3.0mm · 0.38mm/px · 11 of 45 slices shown (1 of 2)]
[im 1/45]
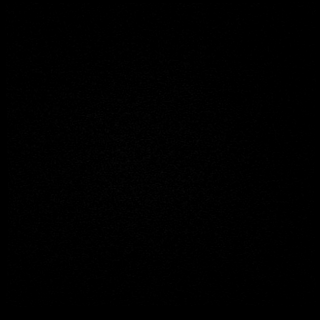
[im 5/45]
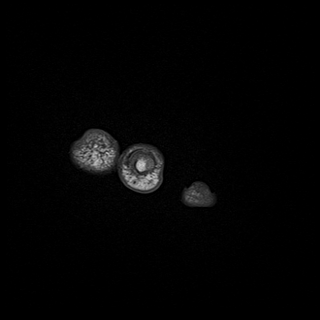
[im 9/45]
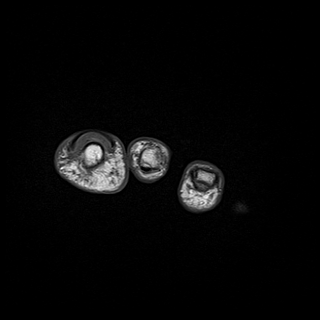
[im 14/45]
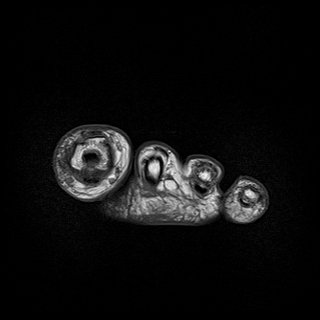
[im 18/45]
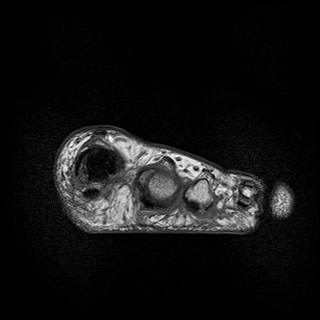
[im 23/45]
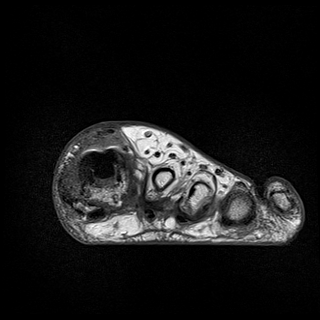
[im 27/45]
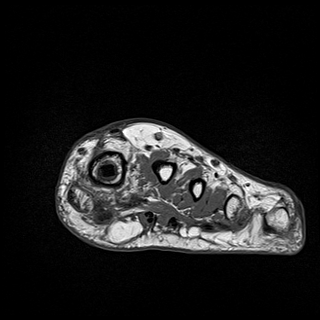
[im 31/45]
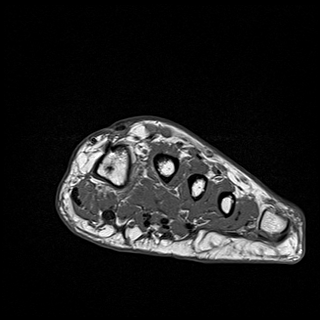
[im 36/45]
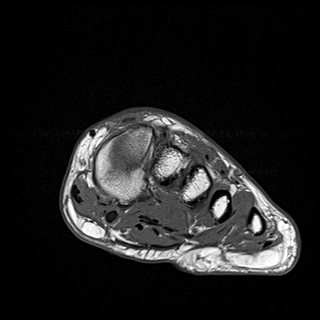
[im 40/45]
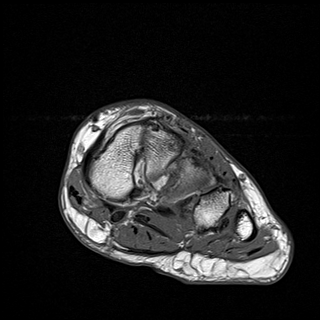
[im 45/45]
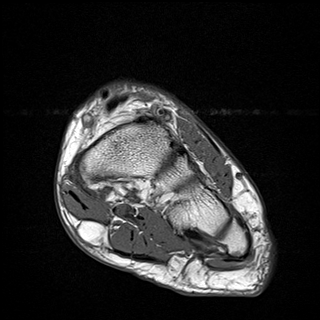

[Series 6: T2 · coronal · left · 3.0mm · 0.38mm/px · 11 of 45 slices shown (1 of 2)]
[im 1/45]
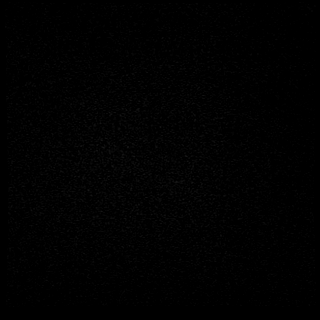
[im 5/45]
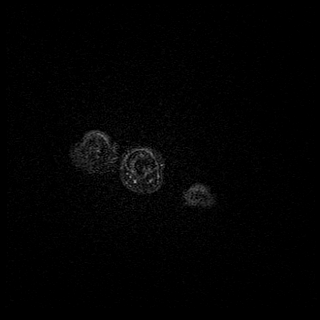
[im 9/45]
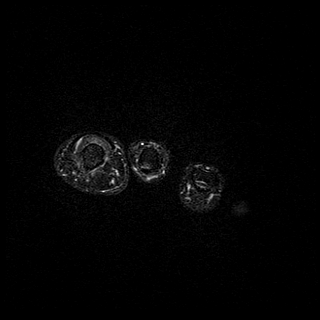
[im 14/45]
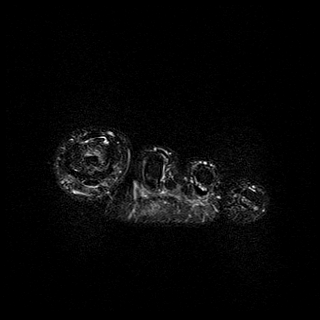
[im 18/45]
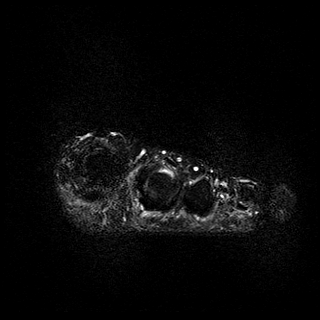
[im 23/45]
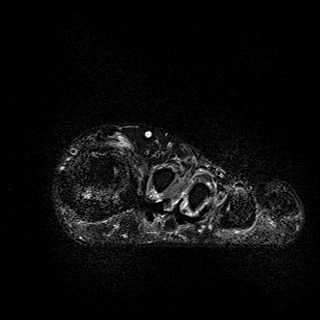
[im 27/45]
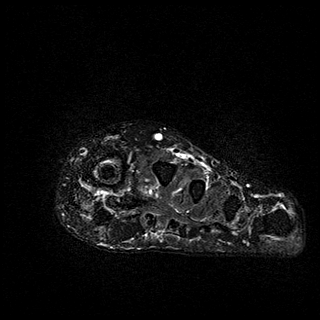
[im 31/45]
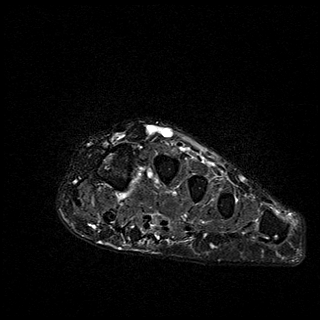
[im 36/45]
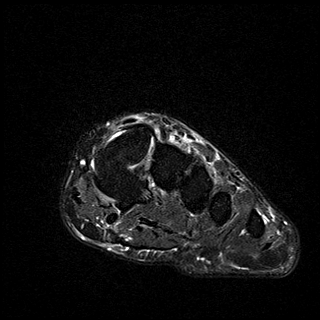
[im 40/45]
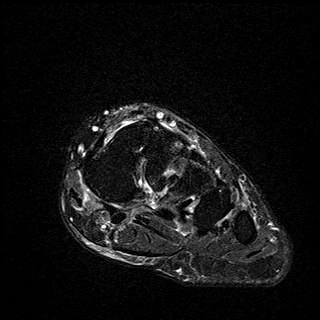
[im 45/45]
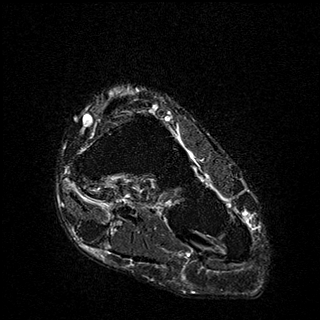

[Series 7: T1 · axial · left · 3.0mm · 0.70mm/px · z∈[-67,+6]mm · 5 of 20 slices shown (2 of 2)]
[im 1/20]
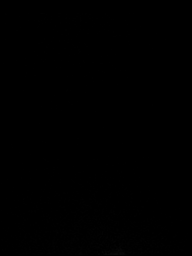
[im 5/20]
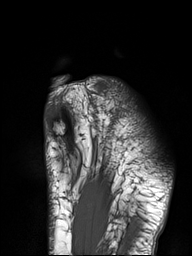
[im 10/20]
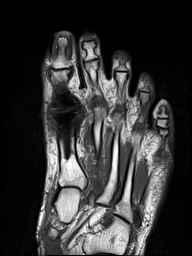
[im 15/20]
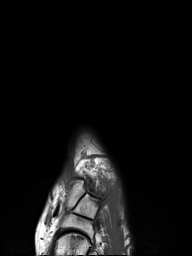
[im 20/20]
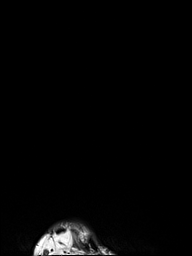

[Series 9: T2 · axial · left · 3.0mm · 0.70mm/px · z∈[-67,+6]mm · 5 of 20 slices shown (2 of 2)]
[im 1/20]
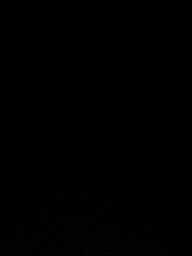
[im 5/20]
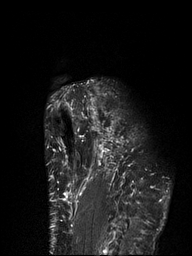
[im 10/20]
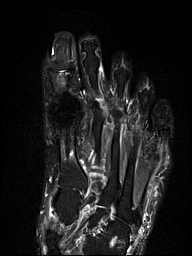
[im 15/20]
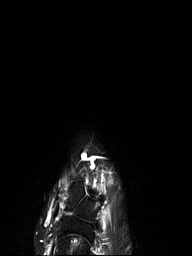
[im 20/20]
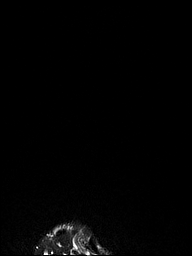

[Series 10: STIR · sagittal · left · 3.0mm · 0.62mm/px · 8 of 31 slices shown]
[im 1/31]
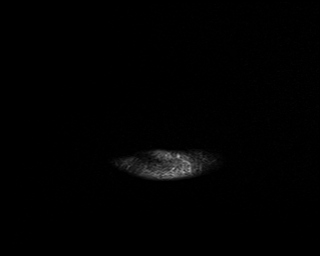
[im 5/31]
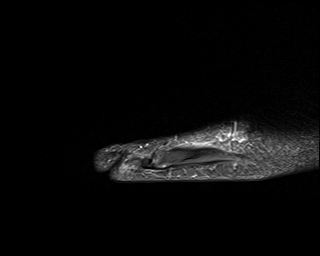
[im 9/31]
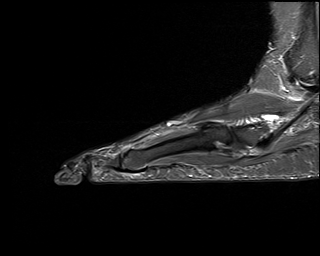
[im 13/31]
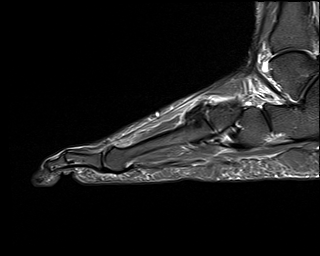
[im 18/31]
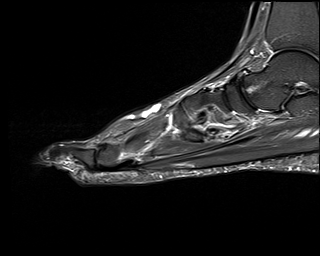
[im 22/31]
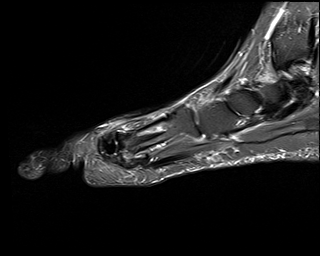
[im 26/31]
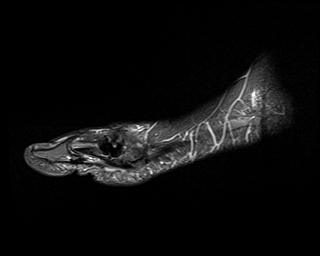
[im 31/31]
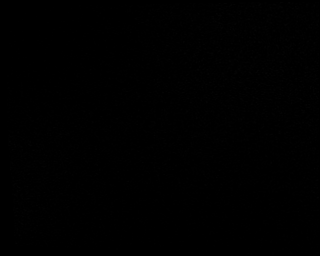

[40 of 40 positions shown; findings below may reference images not displayed]

FINDINGS: Bones/Joint/Cartilage

Status post total joint arthroplasty at the 1st metatarsophalangeal
joint. The hardware appears well positioned. There is minimal marrow
edema in the mid 1st metatarsal surrounding the prosthesis. No
evidence of acute fracture or dislocation. The fibular sesamoid of
the 1st metatarsal appears surgically absent. There is slight medial
subluxation at the 2nd metatarsophalangeal joint which appears
unchanged from the previous radiographs. No erosive changes or
significant joint effusions are seen. There are mild midfoot
degenerative changes, primarily in the talar head and middle
cuneiform. There is a T2 hyperintense lesion within the cuboid which
is well-circumscribed and appears nonaggressive.

Ligaments

The lateral collateral ligament of the 2nd metatarsophalangeal joint
is not well visualized and appears attenuated. The medial collateral
ligament is intact. The Lisfranc ligament is intact.

Muscles and Tendons

No focal muscular abnormality or significant tenosynovitis in the
forefoot.

Soft tissues

Minimal dorsal subcutaneous edema without focal fluid collection.
IMPRESSION: 1. Mild nonspecific marrow edema within the 1st metatarsal adjacent
to proximal component of the joint replacement. No acute osseous
findings.
2. Chronic mild medial subluxation at the 2nd metatarsophalangeal
joint. The lateral collateral ligament at that joint is not well
visualized and may be chronically torn.
3. Mild midfoot degenerative changes.
4. No significant musculotendinous findings.

## 2020-08-05 ENCOUNTER — Encounter: Payer: Self-pay | Admitting: Family Medicine

## 2020-08-07 ENCOUNTER — Other Ambulatory Visit: Payer: Self-pay

## 2020-08-07 ENCOUNTER — Emergency Department (HOSPITAL_COMMUNITY)
Admission: EM | Admit: 2020-08-07 | Discharge: 2020-08-07 | Disposition: A | Discharge status: Home / Self Care | Payer: No Typology Code available for payment source | Attending: Emergency Medicine | Admitting: Emergency Medicine

## 2020-08-07 ENCOUNTER — Other Ambulatory Visit (HOSPITAL_COMMUNITY): Payer: No Typology Code available for payment source

## 2020-08-07 ENCOUNTER — Emergency Department (HOSPITAL_COMMUNITY): Payer: No Typology Code available for payment source

## 2020-08-07 DIAGNOSIS — R1013 Epigastric pain: Secondary | ICD-10-CM | POA: Diagnosis not present

## 2020-08-07 DIAGNOSIS — Z7984 Long term (current) use of oral hypoglycemic drugs: Secondary | ICD-10-CM | POA: Insufficient documentation

## 2020-08-07 DIAGNOSIS — R112 Nausea with vomiting, unspecified: Secondary | ICD-10-CM

## 2020-08-07 DIAGNOSIS — I10 Essential (primary) hypertension: Secondary | ICD-10-CM | POA: Insufficient documentation

## 2020-08-07 DIAGNOSIS — Z79899 Other long term (current) drug therapy: Secondary | ICD-10-CM | POA: Insufficient documentation

## 2020-08-07 DIAGNOSIS — E119 Type 2 diabetes mellitus without complications: Secondary | ICD-10-CM | POA: Insufficient documentation

## 2020-08-07 LAB — CBC
HCT: 40.3 % (ref 36.0–46.0)
Hemoglobin: 12.3 g/dL (ref 12.0–15.0)
MCH: 24.7 pg — ABNORMAL LOW (ref 26.0–34.0)
MCHC: 30.5 g/dL (ref 30.0–36.0)
MCV: 80.9 fL (ref 80.0–100.0)
Platelets: 132 10*3/uL — ABNORMAL LOW (ref 150–400)
RBC: 4.98 MIL/uL (ref 3.87–5.11)
RDW: 15.4 % (ref 11.5–15.5)
WBC: 3.4 10*3/uL — ABNORMAL LOW (ref 4.0–10.5)
nRBC: 0 % (ref 0.0–0.2)

## 2020-08-07 LAB — COMPREHENSIVE METABOLIC PANEL
ALT: 20 U/L (ref 0–44)
AST: 23 U/L (ref 15–41)
Albumin: 3.7 g/dL (ref 3.5–5.0)
Alkaline Phosphatase: 61 U/L (ref 38–126)
Anion gap: 6 (ref 5–15)
BUN: 11 mg/dL (ref 6–20)
CO2: 26 mmol/L (ref 22–32)
Calcium: 9 mg/dL (ref 8.9–10.3)
Chloride: 107 mmol/L (ref 98–111)
Creatinine, Ser: 0.78 mg/dL (ref 0.44–1.00)
GFR, Estimated: >60 mL/min (ref >60)
Glucose, Bld: 182 mg/dL — ABNORMAL HIGH (ref 70–99)
Potassium: 3.4 mmol/L — ABNORMAL LOW (ref 3.5–5.1)
Sodium: 139 mmol/L (ref 135–145)
Total Bilirubin: 0.5 mg/dL (ref 0.3–1.2)
Total Protein: 6.7 g/dL (ref 6.5–8.1)

## 2020-08-07 LAB — URINALYSIS, ROUTINE W REFLEX MICROSCOPIC
Bilirubin Urine: NEGATIVE
Glucose, UA: NEGATIVE mg/dL
Hgb urine dipstick: NEGATIVE
Ketones, ur: 20 mg/dL — AB
Leukocytes,Ua: NEGATIVE
Nitrite: NEGATIVE
Protein, ur: 30 mg/dL — AB
Specific Gravity, Urine: 1.027 (ref 1.005–1.030)
pH: 5 (ref 5.0–8.0)

## 2020-08-07 LAB — LIPASE, BLOOD: Lipase: 25 U/L (ref 11–51)

## 2020-08-07 MED ORDER — PANTOPRAZOLE SODIUM 40 MG IV SOLR
40.0000 mg | Freq: Once | INTRAVENOUS | Status: AC
  Administered 2020-08-07: 40 mg via INTRAVENOUS
  Filled 2020-08-07: qty 40

## 2020-08-07 MED ORDER — POTASSIUM CHLORIDE CRYS ER 20 MEQ PO TBCR
20.0000 meq | EXTENDED_RELEASE_TABLET | Freq: Once | ORAL | Status: AC
  Administered 2020-08-07: 20 meq via ORAL
  Filled 2020-08-07: qty 1

## 2020-08-07 MED ORDER — OMEPRAZOLE 20 MG PO CPDR: 20.0000 mg | DELAYED_RELEASE_CAPSULE | Freq: Two times a day (BID) | ORAL | 0 refills | Status: DC

## 2020-08-07 MED ORDER — SODIUM CHLORIDE 0.9 % IV BOLUS
1000.0000 mL | Freq: Once | INTRAVENOUS | Status: AC
  Administered 2020-08-07: 1000 mL via INTRAVENOUS

## 2020-08-07 MED ORDER — ONDANSETRON HCL 4 MG/2ML IJ SOLN
4.0000 mg | Freq: Once | INTRAMUSCULAR | Status: AC
  Administered 2020-08-07: 4 mg via INTRAVENOUS
  Filled 2020-08-07: qty 2

## 2020-08-07 MED ORDER — ONDANSETRON 4 MG PO TBDP: 4.0000 mg | ORAL_TABLET | Freq: Once | ORAL | Status: DC

## 2020-08-07 MED ORDER — ONDANSETRON 4 MG PO TBDP: 4.0000 mg | ORAL_TABLET | Freq: Three times a day (TID) | ORAL | 0 refills | Status: DC | PRN

## 2020-08-07 NOTE — ED Triage Notes (Signed)
Pt presents with burning epigastric pain with nausea and vomiting since Friday. Denies chest pain. Hypertensive in triage.

## 2020-08-07 NOTE — ED Provider Notes (Signed)
[place] EMERGENCY DEPARTMENT Provider Note   CSN: [id number] Arrival date & time: 08/07/20  1010     History Chief Complaint  Patient presents with  . Abdominal Pain    [name] is a 60 y.o. female with PMH of HTN and type II DM on metformin who presents to the ED with a 3-day history of upper abdominal pain in setting of nausea and vomiting.  On my examination, patient reports that on Friday she was at work when she smelled something that caused her to be nauseated.  She left and went outside and her nausea symptoms persisted.  She tried eating saltine cracker, but did not help her symptoms.  Since then, she experienced episodes of emesis every 30 minutes to 1 hour.  She describes it as being Fannie Alomar bilious and "cloudy".  Denies any obvious hematemesis.  She is also been experiencing upper abdominal discomfort, worse with retching and emesis.    She called her primary care provider who advised her to go to the ER for evaluation.  She was noted to be hypertensive in triage, patient states that this is an ongoing issue for her.  She was on antihypertensives previously, but then stopped.  She does not like the way they make her feel.  She understands that the importance of being restarted on antihypertensives in addition to lifestyle modifications to help control her blood pressure and mitigate risk of stroke as well as cardiac or kidney disease.    She has not taken anything for her symptoms.  She denies any marijuana use.  She does endorse drinking wine intermittently.  No illicit drug use.  Her most recent bowel movement was this morning, soft brown.  Abdominal surgical history notable for hysterectomy.  Denies any lower abdominal/pelvic symptoms.  For the past month she has been experiencing relative early satiety.  I reviewed patient's medical record and upper endoscopy obtained 08/21/2017 by Dr. Silverio Decamp with [facility] was without any explanation for her  dysphagia.    Denies any obvious recent sick contacts, fevers or chills, chest pain or difficulty breathing, urinary symptoms, or changes in her bowel habits.   HPI     Past Medical History:  Diagnosis Date  . Anal intraepithelial neoplasia II (AIN II)   . Arthritis    shoulder, feet  . B12 deficiency   . Essential hypertension    followed by pcp--- currently no meds,  states when she stopped sinus medication otc , bp better  . High frequency hearing loss   . History of abnormal Pap smear   . History of uterine leiomyoma   . Hx of migraine headaches   . Iron deficiency anemia   . Type 2 diabetes mellitus ([place])    followed by pcp    Patient Active Problem List   Diagnosis Date Noted  . Anxiety 02/24/2018  . Dysuria 01/30/2018  . 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 Essential hypertension 10/11/2016  . Screening mammogram, encounter for 08/30/2016  . Routine general medical examination at a health care facility 08/30/2016  . Encounter for screening for HIV 08/30/2016  . Need for hepatitis C screening test 08/30/2016  . History of herpes zoster 05/12/2015  . Hyperlipidemia with target low density lipoprotein (LDL) cholesterol less than 100 mg/dL 12/21/2012  . Obesity 12/21/2012  . Vaginal atrophy 12/18/2012  . Dyspepsia 08/20/2012  . Rectal bleeding 12/04/2011  . Lower back pain 12/04/2011  . ELEVATED BLOOD PRESSURE 06/04/2010  .  ANEMIA, B12 DEFICIENCY 10/05/2008  . THROMBOCYTOPENIA 10/05/2008  . Allergic rhinitis 10/05/2008  . Chronic headache 10/05/2008  . FREQUENCY, URINARY 10/05/2008  . Type 2 diabetes mellitus without complications ([place]) [id number]  . ANEMIA, IRON DEFICIENCY 01/26/2008    Past Surgical History:  Procedure Laterality Date  . BUNIONECTOMY Bilateral 1982  . CARDIOVASCULAR STRESS TEST  09-19-2010   @ [place]   normal nuclear study w/ no evidence ishemia/  normal LV funciton and wall motion , ef 81%  . COLONOSCOPY WITH  ESOPHAGOGASTRODUODENOSCOPY (EGD)  last one 08-21-2017  . HIGH RESOLUTION ANOSCOPY N/A 10/09/2017   Procedure: HIGH RESOLUTION ANOSCOPY,  BIOPSY  EXCISION;  Surgeon: Leighton Ruff, MD;  Location: [facility];  Service: General;  Laterality: N/A;  . LAPAROSCOPIC ASSISTED VAGINAL HYSTERECTOMY  09-18-2005    dr Earvin Hansen  . LEEP    . MYOMECTOMY ABDOMINAL APPROACH  09-04-2001    dr [facility]   w/ lysis adhesions  . NASAL ENDOSCOPY  03/2007   neg  . TONSILLECTOMY  child     OB History    Gravida  0   Para  0   Term  0   Preterm  0   AB  0   Living  0     SAB  0   IAB  0   Ectopic  0   Multiple  0   Live Births              Family History  Problem Relation Age of Onset  . Heart disease Sister   . Arthritis Sister   . Diabetes Sister   . Hypertension Sister   . Diabetes Maternal Grandmother   . Hypertension Maternal Grandmother   . Lung cancer Father   . Colon cancer Father   . Sudden death Daughter        after giving birth / ? if blood clot  . Breast cancer Neg Hx   . Esophageal cancer Neg Hx   . Stomach cancer Neg Hx   . Rectal cancer Neg Hx     Social History   Tobacco Use  . Smoking status: Never Smoker  . Smokeless tobacco: Never Used  Vaping Use  . Vaping Use: Never used  Substance Use Topics  . Alcohol use: Yes    Comment: occasional wine  . Drug use: No    Home Medications Prior to Admission medications   Medication Sig Start Date End Date Taking? Authorizing Provider  omeprazole (PRILOSEC) 20 MG capsule Take 1 capsule (20 mg total) by mouth 2 (two) times daily before a meal. 08/07/20 09/06/20 Yes Andres Escandon, Rowe Clack, PA-C  ondansetron (ZOFRAN ODT) 4 MG disintegrating tablet Take 1 tablet (4 mg total) by mouth every 8 (eight) hours as needed for nausea or vomiting. 08/07/20  Yes Corena Herter, PA-C  acetaminophen (TYLENOL) 500 MG tablet Take 500 mg by mouth every 6 (six) hours as needed.    [provider]  amLODipine  (NORVASC) 5 MG tablet Take 1 tablet (5 mg total) by mouth daily. 06/15/18   Tower, Wynelle Fanny, MD  busPIRone (BUSPAR) 15 MG tablet TAKE 1/2 TABLETS (7.5 MG TOTAL) BY MOUTH 2 (TWO) TIMES DAILY. 05/26/18   Tower, Wynelle Fanny, MD  diclofenac sodium (VOLTAREN) 1 % GEL Apply 4 g topically 4 (four) times daily. 06/01/18   Hyatt, Max T, DPM  fluticasone (FLONASE) 50 MCG/ACT nasal spray Place 2 sprays into both nostrils daily. 12/18/18   Tower,  Wynelle Fanny, MD  metFORMIN (GLUCOPHAGE) 500 MG tablet TAKE 1 TABLET (500 MG TOTAL) BY MOUTH 2 (TWO) TIMES DAILY WITH A MEAL. 03/19/19   Tower, Wynelle Fanny, MD    Allergies    Lisinopril, Losartan, and Amoxicillin-pot clavulanate  Review of Systems   Review of Systems  All other systems reviewed and are negative.   Physical Exam Updated Vital Signs BP (!) 164/93   Pulse 73   Temp 98.6 F (37 C) (Oral)   Resp 17   Ht 5\' 3"  (1.6 m)   Wt 90.4 kg   SpO2 100%   BMI 35.30 kg/m   Physical Exam Vitals and nursing note reviewed. Exam conducted with a chaperone present.  Constitutional:      General: She is not in acute distress.    Appearance: She is not toxic-appearing.  HENT:     Head: Normocephalic and atraumatic.  Eyes:     General: No scleral icterus.    Conjunctiva/sclera: Conjunctivae normal.  Cardiovascular:     Rate and Rhythm: Normal rate.     Pulses: Normal pulses.  Pulmonary:     Effort: Pulmonary effort is normal. No respiratory distress.  Abdominal:     General: Abdomen is flat. There is no distension.     Palpations: Abdomen is soft.     Tenderness: There is abdominal tenderness. There is no guarding.     Comments: Soft, nondistended.  Epigastric and RUQ abdominal tenderness.  No tenderness elsewhere.  No overlying skin changes.  No guarding.  No peritoneal signs.  Skin:    General: Skin is dry.  Neurological:     Mental Status: She is alert and oriented to person, place, and time.     GCS: GCS eye subscore is 4. GCS verbal subscore is 5. GCS  motor subscore is 6.  Psychiatric:        Mood and Affect: Mood normal.        Behavior: Behavior normal.        Thought Content: Thought content normal.     ED Results / Procedures / Treatments   Labs (all labs ordered are listed, but only abnormal results are displayed) Labs Reviewed  COMPREHENSIVE METABOLIC PANEL - Abnormal; Notable for the following components:      Result Value   Potassium 3.4 (*)    Glucose, Bld 182 (*)    All other components within normal limits  CBC - Abnormal; Notable for the following components:   WBC 3.4 (*)    MCH 24.7 (*)    Platelets 132 (*)    All other components within normal limits  URINALYSIS, ROUTINE W REFLEX MICROSCOPIC - Abnormal; Notable for the following components:   APPearance HAZY (*)    Ketones, ur 20 (*)    Protein, ur 30 (*)    Bacteria, UA MANY (*)    All other components within normal limits  URINE CULTURE  LIPASE, BLOOD    EKG None  Radiology US Abdomen Limited  Result Date: 08/07/2020 CLINICAL DATA:  Right upper quadrant pain, nausea and vomiting for 3 days. EXAM: ULTRASOUND ABDOMEN LIMITED RIGHT UPPER QUADRANT COMPARISON:  CT abdomen and pelvis 10/18/2017. FINDINGS: Gallbladder: No gallstones or wall thickening visualized. No sonographic Murphy sign noted by sonographer. Common bile duct: Diameter: 0.3 cm Liver: No focal lesion. Echogenicity is mildly increased. Portal vein is patent on color Doppler imaging with normal direction of blood flow towards the liver. Other: None. IMPRESSION: Negative for gallstones.  No acute  abnormality. Mild appearing fatty infiltration of the liver. Electronically Signed   By: Inge Rise M.D.   On: 08/07/2020 15:03    Procedures Procedures   Medications Ordered in ED Medications  potassium chloride SA (KLOR-CON) CR tablet 20 mEq (has no administration in time range)  ondansetron (ZOFRAN) injection 4 mg (4 mg Intravenous Given 08/07/20 1304)  sodium chloride 0.9 % bolus 1,000 mL  (1,000 mLs Intravenous New Bag/Given 08/07/20 1304)  pantoprazole (PROTONIX) injection 40 mg (40 mg Intravenous Given 08/07/20 1310)    ED Course  I have reviewed the triage vital signs and the nursing notes.  Pertinent labs & imaging results that were available during my care of the patient were reviewed by me and considered in my medical decision making (see chart for details).    MDM Rules/Calculators/A&P                          [name] was evaluated in Emergency Department on 08/07/2020 for the symptoms described in the history of present illness. She was evaluated in the context of the global COVID-19 pandemic, which necessitated consideration that the patient might be at risk for infection with the SARS-CoV-2 virus that causes COVID-19. Institutional protocols and algorithms that pertain to the evaluation of patients at risk for COVID-19 are in a state of rapid change based on information released by regulatory bodies including the CDC and federal and state organizations. These policies and algorithms were followed during the patient's care in the ED.  I personally reviewed patient's medical chart and all notes from triage and staff during today's encounter. I have also ordered and reviewed all labs and imaging that I felt to be medically necessary in the evaluation of this patient's complaints and with consideration of their physical exam. If needed, translation services were available and utilized.   Patient with a 3-day episode of nausea and emesis.  She is also endorsing epigastric/right upper quadrant abdominal pain which she developed subsequent to her retching and vomiting.  She also endorses 1 month history of early satiety.  We will proceed with basic laboratory work-up as well as right upper quadrant abdominal ultrasound to evaluate for possible biliary pathology.  If negative, suspect that her epigastric/right upper quadrant abdominal discomfort is subsequent to her  retching and vomiting.  She denies any melena or obvious hematemesis.  Laboratory work-up entirely unremarkable.  Mild hypokalemia 3.4, will replete.  This is consistent with her reported nausea and vomiting.  She also has ketones on her UA suggestive of dehydration.  She was given 1 L IV NS.  Many bacteria in her UA, but no WBC.  Denies lower abdominal pain or dysuria.  Will hold off on treatment and instead send for culture.  Ultrasound of right upper quadrant was without any cholelithiasis or evidence of cholecystitis.  Mildly fatty liver, but otherwise normal study.  On subsequent evaluation, patient feels improved after Protonix and Zofran.  We will discharge her home with continued proton pump inhibitors and antiemetics.  She will follow-up with her primary care provider regarding today's ED encounter and for ongoing evaluation and management.  She will require laboratory recheck in 3 days to ensure correction of her low potassium.  They will also discuss diabetic gastroparesis as possible cause for her collection of symptoms, however I suspect more likely viral enteritis versus gastritis.  She was successfully p.o. challenged here in the ED prior to discharge.  Return precautions discussed.  Patient voices understanding and is agreeable.  Final Clinical Impression(s) / ED Diagnoses Final diagnoses:  Non-intractable vomiting with nausea, unspecified vomiting type  Epigastric pain    Rx / DC Orders ED Discharge Orders         Ordered    omeprazole (PRILOSEC) 20 MG capsule  2 times daily before meals        08/07/20 1547    ondansetron (ZOFRAN ODT) 4 MG disintegrating tablet  Every 8 hours PRN        08/07/20 1547           Corena Herter, PA-C 08/07/20 1547    Milton Ferguson, MD 08/08/20 [phone]

## 2020-08-07 NOTE — Telephone Encounter (Signed)
I spoke with pt who is at [facility] ED now. Sending FYI to Dr Glori Bickers.

## 2020-08-07 NOTE — ED Provider Notes (Signed)
Emergency Medicine Provider Triage Evaluation Note  [name] , a 60 y.o. female  was evaluated in triage.  Pt complains of 3 day history of epigastric abd pain with N/V.  Unclear of possible hematemesis.  "Cloudy looking".  No marijuana. Some wine. Unknown fevers.  HA.   Review of Systems  Positive: Abdominal pain, N/V.   Negative: Hx abdominal surgeries.  Changes bowel habits.   Physical Exam  BP (!) 177/117 (BP Location: Right Arm)   Pulse 89   Temp 98.6 F (37 C) (Oral)   Resp 20   Ht 5\' 3"  (1.6 m)   Wt 90.4 kg   SpO2 98%   BMI 35.30 kg/m  Gen:   Awake, no distress   Resp:  Normal effort  MSK:   Moves extremities without difficulty  Abd: TTP epigastrium/RUQ   Medical Decision Making  Medically screening exam initiated at 10:42 AM.  Appropriate orders placed.  [name] was informed that the remainder of the evaluation will be completed by another provider, this initial triage assessment does not replace that evaluation, and the importance of remaining in the ED until their evaluation is complete.     Corena Herter, PA-C 08/07/20 1046    Milton Ferguson, MD 08/07/20 1257

## 2020-08-07 NOTE — Telephone Encounter (Signed)
[facility] - Client TELEPHONE ADVICE RECORD [facility] Patient Name: [name] ER Gender: Female DOB: 1961/03/28 Age: 60 Y 43 M 19 D Return Phone Number: [phone] (Primary) Address: City/ State/ Zip: [place] [address] Client [facility] - Client Client Site [place] MD Contact Type Call Who Is Calling Patient / Member / Family / Caregiver Call Type Triage / Clinical Relationship To Patient Self Return Phone Number [phone] (Primary) Chief Complaint ABDOMINAL PAIN - Severe and only in abdomen Reason for Call Symptomatic / Request for [name] said she was vomiting on friday but it stopped yesterday. Caller said she also has stomach pain (8/10). [place] ED Translation No Nurse Assessment Nurse: Windle Guard, RN, Lesa Date/Time Eilene Ghazi Time): 08/07/2020 8:38:10 AM Confirm and document reason for call. If symptomatic, describe symptoms. ---Caller states she has upper abdominal pain Does the patient have any new or worsening symptoms? ---Yes Will a triage be completed? ---Yes Related visit to physician within the last 2 weeks? ---Yes Does the PT have any chronic conditions? (i.e. diabetes, asthma, this includes High risk factors for pregnancy, etc.) ---Yes List chronic conditions. ---Diabetes Is this a behavioral health or substance abuse call? ---No Guidelines Guideline Title Affirmed Question Affirmed Notes Nurse Date/Time Eilene Ghazi Time) Abdominal Pain - Upper [1] SEVERE pain (e.g., excruciating) AND [2] present > 1 hour Conner, RN, Lesa 08/07/2020 8:39:35 AM Disp. Time Eilene Ghazi Time) Disposition Final User 08/07/2020 8:35:11 AM Send to Urgent Tiffany Kocher PLEASE NOTE: All timestamps contained within this report are represented as [place] Standard Time. CONFIDENTIALTY  NOTICE: This fax transmission is intended only for the addressee. It contains information that is legally privileged, confidential or otherwise protected from use or disclosure. If you are not the intended recipient, you are strictly prohibited from reviewing, disclosing, copying using or disseminating any of this information or taking any action in reliance on or regarding this information. If you have received this fax in error, please notify us immediately by telephone so that we can arrange for its return to [place]. Phone: [phone], Toll-Free: [phone], Fax: [phone] Page: 2 of 2 Call Id: [id number] 08/07/2020 8:41:24 AM Go to ED Now Yes Windle Guard, RN, Lesa Caller Disagree/Comply Comply Caller Understands Yes PreDisposition Did not know what to do Care Advice Given Per Guideline GO TO ED NOW: * You need to be seen in the Emergency Department. * Go to the ED at ___________ [place] now. Drive carefully. NOTE TO TRIAGER - DRIVING: * Another adult should drive. BRING MEDICINES: * Bring the pill bottles too. This will help the doctor (or NP/PA) to make certain you are taking the right medicines and the right dose. NOTHING BY MOUTH: * Do not eat or drink anything for now. CARE ADVICE given per Abdominal Pain, Upper (Adult) guideline. Referrals GO TO FACILITY OTHER - SPECIFY

## 2020-08-07 NOTE — Discharge Instructions (Signed)
Your work-up today was reassuring.  Suspect you had viral GI bug versus inflammation of your stomach lining.  Please take the medications, as directed.  Follow-up with your primary care provider regarding today's ED encounter for ongoing evaluation and management.   Return to the ER or seek immediate medical attention should you experience any new or worsening symptoms.

## 2020-08-09 ENCOUNTER — Telehealth: Payer: Self-pay | Admitting: *Deleted

## 2020-08-09 LAB — URINE CULTURE: Culture: <10000 — AB

## 2020-08-09 NOTE — Telephone Encounter (Signed)
Pt called regarding insurance not covering Rx as written.  Pt passed phone to pharmacist who advised that Prilisec 40mg  capsule daily is what iis covered.  RNCM gave verbal order to change to Prilosec 40 mg capsule.

## 2020-08-15 ENCOUNTER — Telehealth: Payer: Self-pay | Admitting: Family Medicine

## 2020-08-15 DIAGNOSIS — R1013 Epigastric pain: Secondary | ICD-10-CM | POA: Insufficient documentation

## 2020-08-15 NOTE — Telephone Encounter (Addendum)
Pt said that they gave her zofran but it's not helping because she's not really nauseous she is just having abd pain. Pt said no GERD sxs but every time she eats the "top of her stomach hurts" pt said that's her main sxs., pt would like to proceed with GI referral given sxs

## 2020-08-15 NOTE — Telephone Encounter (Signed)
Mrs. [name] called in wanted to know if she can get a referral for GI doctor due to she is still having stomach problems.  And would like the doctor to be in [place]   Please advise

## 2020-08-15 NOTE — Telephone Encounter (Signed)
Referral is done She will get a call  It may take a while to get in , if symptoms change in the meantime let me know

## 2020-08-15 NOTE — Telephone Encounter (Signed)
I saw her most recent ER note, how are her symptoms ?  What , if anything is she taking?   Thanks

## 2020-08-15 NOTE — Addendum Note (Signed)
Addended by: Loura Pardon A on: 08/15/2020 01:03 PM   Modules accepted: Orders

## 2020-08-25 ENCOUNTER — Ambulatory Visit (INDEPENDENT_AMBULATORY_CARE_PROVIDER_SITE_OTHER): Payer: No Typology Code available for payment source | Admitting: Family Medicine

## 2020-08-25 ENCOUNTER — Other Ambulatory Visit: Payer: Self-pay

## 2020-08-25 VITALS — BP 182/110 | HR 78 | Temp 97.1°F | Ht 63.0 in | Wt 194.5 lb

## 2020-08-25 DIAGNOSIS — K3184 Gastroparesis: Secondary | ICD-10-CM

## 2020-08-25 DIAGNOSIS — D696 Thrombocytopenia, unspecified: Secondary | ICD-10-CM

## 2020-08-25 DIAGNOSIS — E119 Type 2 diabetes mellitus without complications: Secondary | ICD-10-CM | POA: Diagnosis not present

## 2020-08-25 DIAGNOSIS — I10 Essential (primary) hypertension: Secondary | ICD-10-CM | POA: Diagnosis not present

## 2020-08-25 DIAGNOSIS — E785 Hyperlipidemia, unspecified: Secondary | ICD-10-CM

## 2020-08-25 DIAGNOSIS — L659 Nonscarring hair loss, unspecified: Secondary | ICD-10-CM

## 2020-08-25 DIAGNOSIS — E6609 Other obesity due to excess calories: Secondary | ICD-10-CM

## 2020-08-25 DIAGNOSIS — R1013 Epigastric pain: Secondary | ICD-10-CM | POA: Diagnosis not present

## 2020-08-25 DIAGNOSIS — Z6834 Body mass index (BMI) 34.0-34.9, adult: Secondary | ICD-10-CM

## 2020-08-25 DIAGNOSIS — Z6835 Body mass index (BMI) 35.0-35.9, adult: Secondary | ICD-10-CM

## 2020-08-25 LAB — POCT GLYCOSYLATED HEMOGLOBIN (HGB A1C): Hemoglobin A1C: 9.1 % — AB (ref 4.0–5.6)

## 2020-08-25 MED ORDER — AMLODIPINE BESYLATE 5 MG PO TABS: 5.0000 mg | ORAL_TABLET | Freq: Every day | ORAL | 3 refills | Status: DC

## 2020-08-25 MED ORDER — METFORMIN HCL 500 MG PO TABS: 500.0000 mg | ORAL_TABLET | Freq: Two times a day (BID) | ORAL | 3 refills | Status: DC

## 2020-08-25 MED ORDER — FLUTICASONE PROPIONATE 50 MCG/ACT NA SUSP: 2.0000 | Freq: Every day | NASAL | 2 refills | Status: DC

## 2020-08-25 NOTE — Progress Notes (Signed)
Subjective:    Patient ID: [name], female    DOB: [birth year], 60 y.o.   MRN: [id number]  This visit occurred during the SARS-CoV-2 public health emergency.  Safety protocols were in place, including screening questions prior to the visit, additional usage of staff PPE, and extensive cleaning of exam room while observing appropriate contact time as indicated for disinfecting solutions.    HPI Pt presents for f/u of ER visit on 5/9  Wt Readings from Last 3 Encounters:  08/25/20 194 lb 8 oz (88.2 kg)  08/07/20 199 lb 4.7 oz (90.4 kg)  05/18/18 199 lb 4 oz (90.4 kg)   34.45 kg/m   She was seen in the ER for abdominal pain with n/v This began the day before her eval and became worse until emesis every 30 to 60 minutes (no hematemesis) Noted in ER she had stopped all of her medicines prior an dwas hypertensive  Also had RUQ and epigastric pain  [place] of RUQ was reassuring  K was 3.4  Given 1L of NS IV Some bacteria on UA   tx with protonix and zofran  W/c home with ppi and anti emetics  Presumed viral enteritis vs gastritis  abd [place] report: ED Discharged  08/07/2020 [place] [facility]  Results  Procedure Component Value Ref Range Date/Time  Urine culture [[id number]] (Abnormal) Collected: 08/07/20 1518  Order Status: Completed Specimen: Urine, Random Updated: 08/09/20 0905   Specimen Description URINE, RANDOM   Special Requests NONE   Culture --Abnormal   <10,000 COLONIES/mL INSIGNIFICANT GROWTH  Performed at [facility], [address], [place], [place] [address]  Abnormal   Report Status 08/09/2020 FINAL  ED EKG [[id number]] Collected: 08/07/20 1021  Order Status: Completed Updated: 08/08/20 1309  EKG [[id number]] Resulted: 08/08/20 1028  Order Status: Completed Updated: 08/08/20 1030  Narrative:   Ordered by an unspecified provider.  US Abdomen Limited [[id number]] Resulted: 08/07/20 1503  Order Status: Completed  Updated: 08/07/20 1505  Narrative:   CLINICAL DATA: Right upper quadrant pain, nausea and vomiting for 3  days.   EXAM:  ULTRASOUND ABDOMEN LIMITED RIGHT UPPER QUADRANT   COMPARISON: CT abdomen and pelvis 10/18/2017.   FINDINGS:  Gallbladder:   No gallstones or wall thickening visualized. No sonographic Murphy  sign noted by sonographer.   Common bile duct:   Diameter: 0.3 cm   Liver:   No focal lesion. Echogenicity is mildly increased. Portal vein is  patent on color Doppler imaging with normal direction of blood flow  towards the liver.   Other: None.   IMPRESSION:  Negative for gallstones. No acute abnormality.   Mild appearing fatty infiltration of the liver.     Labs Lab Results  Component Value Date   CREATININE 0.78 08/07/2020   BUN 11 08/07/2020   NA 139 08/07/2020   K 3.4 (L) 08/07/2020   CL 107 08/07/2020   CO2 26 08/07/2020   Lab Results  Component Value Date   ALT 20 08/07/2020   AST 23 08/07/2020   ALKPHOS 61 08/07/2020   BILITOT 0.5 08/07/2020   Lab Results  Component Value Date   WBC 3.4 (L) 08/07/2020   HGB 12.3 08/07/2020   HCT 40.3 08/07/2020   MCV 80.9 08/07/2020   PLT 132 (L) 08/07/2020   Glucose 182 Lab Results  Component Value Date   LIPASE 25 08/07/2020    Urine cx grew less than 10,000 colonies (insignificant growth)  HTN bp is  stable today  No cp or palpitations or headaches or edema  No side effects to medicines  BP Readings from Last 3 Encounters:  08/25/20 (!) 182/110  08/07/20 (!) 141/91  05/18/18 (!) 146/92    Off med one and 1/2 year due to no insurance   Previously on losartan- chest hurt  Px amlodipine and she never started it  Did not tolerate lisinopril or losartan   Pulse Readings from Last 3 Encounters:  08/25/20 78  08/07/20 63  05/18/18 76   DM2 Last a1c 8.2 Is 9.1 today  Previously  on metformin 500 mg bid   Blood sugars are 140s in am  Can jump up to 200s  Then evening back down  to 120s   She eats a very good for a diabetic diet  No sweets , bread or pasta  Does not exercise    Now stomach hurts epigastric area  Omeprazole in the ER did not help at all  Takes a few bites and cannot eat more  No more vomiting   Patient Active Problem List   Diagnosis Date Noted  . Gastroparesis 08/25/2020  . Hair loss 08/25/2020  . Epigastric pain 08/15/2020  . Anxiety 02/24/2018  . Dysuria 01/30/2018  . 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 Essential hypertension 10/11/2016  . Screening mammogram, encounter for 08/30/2016  . Routine general medical examination at a health care facility 08/30/2016  . Encounter for screening for HIV 08/30/2016  . Need for hepatitis C screening test 08/30/2016  . History of herpes zoster 05/12/2015  . Hyperlipidemia with target low density lipoprotein (LDL) cholesterol less than 100 mg/dL 12/21/2012  . Class 2 severe obesity due to excess calories with serious comorbidity and body mass index (BMI) of 35.0 to 35.9 in adult [facility]) 12/21/2012  . Vaginal atrophy 12/18/2012  . Dyspepsia 08/20/2012  . Rectal bleeding 12/04/2011  . Lower back pain 12/04/2011  . ELEVATED BLOOD PRESSURE 06/04/2010  . ANEMIA, B12 DEFICIENCY 10/05/2008  . THROMBOCYTOPENIA 10/05/2008  . Allergic rhinitis 10/05/2008  . Chronic headache 10/05/2008  . FREQUENCY, URINARY 10/05/2008  . Type 2 diabetes mellitus without complications ([place]) 97/35/3299  . ANEMIA, IRON DEFICIENCY 01/26/2008   Past Medical History:  Diagnosis Date  . Anal intraepithelial neoplasia II (AIN II)   . Arthritis    shoulder, feet  . B12 deficiency   . Essential hypertension    followed by pcp--- currently no meds,  states when she stopped sinus medication otc , bp better  . High frequency hearing loss   . History of abnormal Pap smear   . History of uterine leiomyoma   . Hx of migraine headaches   . Iron deficiency anemia   . Type 2 diabetes mellitus ([place])     followed by pcp   Past Surgical History:  Procedure Laterality Date  . BUNIONECTOMY Bilateral 1982  . CARDIOVASCULAR STRESS TEST  09-19-2010   @ [facility]   normal nuclear study w/ no evidence ishemia/  normal LV funciton and wall motion , ef 81%  . COLONOSCOPY WITH ESOPHAGOGASTRODUODENOSCOPY (EGD)  last one 08-21-2017  . HIGH RESOLUTION ANOSCOPY N/A 10/09/2017   Procedure: HIGH RESOLUTION ANOSCOPY,  BIOPSY  EXCISION;  Surgeon: Leighton Ruff, MD;  Location: [facility];  Service: General;  Laterality: N/A;  . LAPAROSCOPIC ASSISTED VAGINAL HYSTERECTOMY  09-18-2005    dr Earvin Hansen  . LEEP    . MYOMECTOMY ABDOMINAL APPROACH  09-04-2001    dr  rivard Garceno   w/ lysis adhesions  . NASAL ENDOSCOPY  03/2007   neg  . TONSILLECTOMY  child   Social History   Tobacco Use  . Smoking status: Never Smoker  . Smokeless tobacco: Never Used  Vaping Use  . Vaping Use: Never used  Substance Use Topics  . Alcohol use: Yes    Comment: occasional wine  . Drug use: No   Family History  Problem Relation Age of Onset  . Heart disease Sister   . Arthritis Sister   . Diabetes Sister   . Hypertension Sister   . Diabetes Maternal Grandmother   . Hypertension Maternal Grandmother   . Lung cancer Father   . Colon cancer Father   . Sudden death Daughter        after giving birth / ? if blood clot  . Breast cancer Neg Hx   . Esophageal cancer Neg Hx   . Stomach cancer Neg Hx   . Rectal cancer Neg Hx    Allergies  Allergen Reactions  . Lisinopril Cough  . Losartan     Chest pressure   . Amoxicillin-Pot Clavulanate Rash    REACTION: Itchy rash   Current Outpatient Medications on File Prior to Visit  Medication Sig Dispense Refill  . acetaminophen (TYLENOL) 500 MG tablet Take 500 mg by mouth every 6 (six) hours as needed. (Patient not taking: Reported on 08/25/2020)    . diclofenac sodium (VOLTAREN) 1 % GEL Apply 4 g topically 4 (four) times daily. (Patient not taking: Reported on  08/25/2020) 100 g 2  . omeprazole (PRILOSEC) 20 MG capsule Take 1 capsule (20 mg total) by mouth 2 (two) times daily before a meal. (Patient not taking: Reported on 08/25/2020) 60 capsule 0  . ondansetron (ZOFRAN ODT) 4 MG disintegrating tablet Take 1 tablet (4 mg total) by mouth every 8 (eight) hours as needed for nausea or vomiting. (Patient not taking: Reported on 08/25/2020) 20 tablet 0   No current facility-administered medications on file prior to visit.    Review of Systems  Constitutional: Positive for fatigue. Negative for activity change, appetite change, fever and unexpected weight change.  HENT: Negative for congestion, ear pain, rhinorrhea, sinus pressure and sore throat.   Eyes: Negative for pain, redness and visual disturbance.  Respiratory: Negative for cough, shortness of breath and wheezing.   Cardiovascular: Negative for chest pain and palpitations.  Gastrointestinal: Positive for abdominal pain and nausea. Negative for abdominal distention, anal bleeding, blood in stool, constipation, diarrhea and rectal pain.  Endocrine: Negative for polydipsia and polyuria.  Genitourinary: Negative for dysuria, frequency and urgency.  Musculoskeletal: Negative for arthralgias, back pain and myalgias.  Skin: Negative for pallor and rash.  Allergic/Immunologic: Negative for environmental allergies.  Neurological: Negative for dizziness, syncope and headaches.  Hematological: Negative for adenopathy. Does not bruise/bleed easily.  Psychiatric/Behavioral: Negative for decreased concentration and dysphoric mood. The patient is not nervous/anxious.        Objective:   Physical Exam Constitutional:      General: She is not in acute distress.    Appearance: Normal appearance. She is well-developed. She is obese. She is not ill-appearing or diaphoretic.  HENT:     Head: Normocephalic and atraumatic.  Eyes:     Conjunctiva/sclera: Conjunctivae normal.     Pupils: Pupils are equal, round, and  reactive to light.  Neck:     Thyroid: No thyromegaly.     Vascular: No carotid bruit or JVD.  Cardiovascular:  Rate and Rhythm: Normal rate and regular rhythm.     Heart sounds: Normal heart sounds. No gallop.   Pulmonary:     Effort: Pulmonary effort is normal. No respiratory distress.     Breath sounds: Normal breath sounds. No wheezing or rales.  Abdominal:     General: Bowel sounds are normal. There is no distension or abdominal bruit.     Palpations: Abdomen is soft. There is no mass.     Tenderness: There is no abdominal tenderness.  Musculoskeletal:     Cervical back: Normal range of motion and neck supple.     Right lower leg: No edema.     Left lower leg: No edema.  Lymphadenopathy:     Cervical: No cervical adenopathy.  Skin:    General: Skin is warm and dry.     Coloration: Skin is not pale.     Findings: No erythema or rash.  Neurological:     Mental Status: She is alert.     Sensory: No sensory deficit.     Coordination: Coordination normal.     Deep Tendon Reflexes: Reflexes are normal and symmetric. Reflexes normal.  Psychiatric:        Mood and Affect: Mood normal.           Assessment & Plan:   Problem List Items Addressed This Visit      Cardiovascular and Mediastinum   Essential hypertension - Primary    Very high BP off her medication  Has been intolerant to several and never started amlodipine when it was px  Will begin that 5 mg daily  F/u next week  Enc to follow DASH eating plan      Relevant Medications   amLODipine (NORVASC) 5 MG tablet   Other Relevant Orders   Basic metabolic panel (Completed)   Lipid panel (Completed)     Digestive   Gastroparesis    Recent visit to ER for n/v and abd pain  Reviewed hospital records, lab results and studies in detail  Reassuring w/u and no longer vomiting Still has epigastric pain and nausea Per pt no imp at all on ppi zofran did help a little  Strongly suspect gastroparesis from poorly  controlled diabetes  Will work on this /re start DM med Ref to GI for assist      Relevant Orders   Ambulatory referral to Gastroenterology     Endocrine   Type 2 diabetes mellitus without complications ([place])    Lab Results  Component Value Date   HGBA1C 9.1 (A) 08/25/2020   Off medicine for some time Glucose readings are quite labile Enc her to start back on metformin and watch numbers F/u in a week Suspect DM gatroparesis causing epigastric pain and nausea       Relevant Medications   metFORMIN (GLUCOPHAGE) 500 MG tablet   Other Relevant Orders   POCT glycosylated hemoglobin (Hb A1C) (Completed)     Other   THROMBOCYTOPENIA    Last platelet ct 132 No bruising or bleeding or clots      Hyperlipidemia with target low density lipoprotein (LDL) cholesterol less than 100 mg/dL    Labs ordered  Will discuss statin  Per pt diet is overall good      Relevant Medications   amLODipine (NORVASC) 5 MG tablet   Class 2 severe obesity due to excess calories with serious comorbidity and body mass index (BMI) of 35.0 to 35.9 in adult [facility])    Discussed how  this problem influences overall health and the risks it imposes  Reviewed plan for weight loss with lower calorie diet (via better food choices and also portion control or program like weight watchers) and exercise building up to or more than 30 minutes 5 days per week including some aerobic activity         Relevant Medications   metFORMIN (GLUCOPHAGE) 500 MG tablet   Epigastric pain    Reviewed hospital records, lab results and studies in detail  Reassuring abd [place] and labs  No imp with ppi at all  Nausea persists  Gastroparesis is in the differential Ref made to GI      Relevant Orders   Basic metabolic panel (Completed)   Ambulatory referral to Gastroenterology   Hair loss    Pt concerned about some diffuse hair loss at the crown  Could be stress and chronic illness related  Plan to get  bp and glucose under  control  Labs done today incl iron and B12      Relevant Orders   Iron (Completed)   Vitamin B12 (Completed)

## 2020-08-25 NOTE — Patient Instructions (Addendum)
Stick to a diabetic diet   Start back on metformin for blood sugar  Start on amlodipine for blood pressure   I think you have diabetic gastroparesis  We need to get your blood sugar under control for that  I want to also refer you go GI for the abdominal pain   Checking diabetes today  I will check iron and vitamin B12 for hair loss   Follow up next week   I placed a GI referral  You will get a call

## 2020-08-26 LAB — BASIC METABOLIC PANEL
BUN: 13 mg/dL (ref 7–25)
CO2: 27 mmol/L (ref 20–32)
Calcium: 9.9 mg/dL (ref 8.6–10.4)
Chloride: 104 mmol/L (ref 98–110)
Creat: 0.73 mg/dL (ref 0.50–1.05)
Glucose, Bld: 157 mg/dL — ABNORMAL HIGH (ref 65–99)
Potassium: 4.2 mmol/L (ref 3.5–5.3)
Sodium: 140 mmol/L (ref 135–146)

## 2020-08-26 LAB — IRON: Iron: 38 ug/dL — ABNORMAL LOW (ref 45–160)

## 2020-08-26 LAB — LIPID PANEL
Cholesterol: 186 mg/dL (ref <200)
HDL: 42 mg/dL — ABNORMAL LOW (ref >=50)
LDL Cholesterol (Calc): 122 mg/dL (calc) — ABNORMAL HIGH
Non-HDL Cholesterol (Calc): 144 mg/dL (calc) — ABNORMAL HIGH (ref <130)
Total CHOL/HDL Ratio: 4.4 (calc) (ref <5.0)
Triglycerides: 109 mg/dL (ref <150)

## 2020-08-26 LAB — VITAMIN B12: Vitamin B-12: >2000 pg/mL — ABNORMAL HIGH (ref 200–1100)

## 2020-08-27 ENCOUNTER — Encounter: Payer: Self-pay | Admitting: Family Medicine

## 2020-08-27 NOTE — Assessment & Plan Note (Signed)
Labs ordered  Will discuss statin  Per pt diet is overall good

## 2020-08-27 NOTE — Assessment & Plan Note (Signed)
Pt concerned about some diffuse hair loss at the crown  Could be stress and chronic illness related  Plan to get  bp and glucose under control  Labs done today incl iron and B12

## 2020-08-27 NOTE — Assessment & Plan Note (Signed)
Discussed how this problem influences overall health and the risks it imposes  Reviewed plan for weight loss with lower calorie diet (via better food choices and also portion control or program like weight watchers) and exercise building up to or more than 30 minutes 5 days per week including some aerobic activity    

## 2020-08-27 NOTE — Assessment & Plan Note (Signed)
Recent visit to ER for n/v and abd pain  Reviewed hospital records, lab results and studies in detail  Reassuring w/u and no longer vomiting Still has epigastric pain and nausea Per pt no imp at all on ppi zofran did help a little  Strongly suspect gastroparesis from poorly controlled diabetes  Will work on this /re start DM med Ref to GI for assist

## 2020-08-27 NOTE — Assessment & Plan Note (Signed)
Very high BP off her medication  Has been intolerant to several and never started amlodipine when it was px  Will begin that 5 mg daily  F/u next week  Enc to follow DASH eating plan

## 2020-08-27 NOTE — Assessment & Plan Note (Signed)
Lab Results  Component Value Date   HGBA1C 9.1 (A) 08/25/2020   Off medicine for some time Glucose readings are quite labile Enc her to start back on metformin and watch numbers F/u in a week Suspect DM gatroparesis causing epigastric pain and nausea

## 2020-08-27 NOTE — Assessment & Plan Note (Signed)
Reviewed hospital records, lab results and studies in detail  Reassuring abd [place] and labs  No imp with ppi at all  Nausea persists  Gastroparesis is in the differential Ref made to GI

## 2020-08-27 NOTE — Assessment & Plan Note (Signed)
Last platelet ct 132 No bruising or bleeding or clots

## 2020-08-30 ENCOUNTER — Ambulatory Visit (INDEPENDENT_AMBULATORY_CARE_PROVIDER_SITE_OTHER): Payer: No Typology Code available for payment source | Admitting: Family Medicine

## 2020-08-30 ENCOUNTER — Encounter: Payer: Self-pay | Admitting: Nurse Practitioner

## 2020-08-30 ENCOUNTER — Encounter: Payer: Self-pay | Admitting: Family Medicine

## 2020-08-30 ENCOUNTER — Other Ambulatory Visit: Payer: Self-pay

## 2020-08-30 VITALS — BP 141/80 | HR 89 | Temp 97.0°F | Ht 63.0 in | Wt 192.5 lb

## 2020-08-30 DIAGNOSIS — E119 Type 2 diabetes mellitus without complications: Secondary | ICD-10-CM

## 2020-08-30 DIAGNOSIS — I10 Essential (primary) hypertension: Secondary | ICD-10-CM

## 2020-08-30 DIAGNOSIS — D518 Other vitamin B12 deficiency anemias: Secondary | ICD-10-CM | POA: Diagnosis not present

## 2020-08-30 DIAGNOSIS — D508 Other iron deficiency anemias: Secondary | ICD-10-CM | POA: Diagnosis not present

## 2020-08-30 DIAGNOSIS — K3184 Gastroparesis: Secondary | ICD-10-CM

## 2020-08-30 MED ORDER — POLYSACCHARIDE IRON COMPLEX 150 MG PO CAPS: 150.0000 mg | ORAL_CAPSULE | ORAL | 5 refills | Status: DC

## 2020-08-30 NOTE — Assessment & Plan Note (Signed)
Lab Results  Component Value Date   VITAMINB12 >2,000 (H) 08/25/2020   Too high Pt does not know how much she is taking  Will hold for 2 mo and re check then adv from there

## 2020-08-30 NOTE — Assessment & Plan Note (Signed)
Still symptomatic  Working on DM control  Urgent ref to GI made prior and waiting to get appt

## 2020-08-30 NOTE — Progress Notes (Signed)
Subjective:    Patient ID: [name], female    DOB: 26-Jan-1961, 60 y.o.   MRN: [id number]  This visit occurred during the SARS-CoV-2 public health emergency.  Safety protocols were in place, including screening questions prior to the visit, additional usage of staff PPE, and extensive cleaning of exam room while observing appropriate contact time as indicated for disinfecting solutions.    HPI Pt presents for f/u of HTN   Wt Readings from Last 3 Encounters:  08/30/20 192 lb 8 oz (87.3 kg)  08/25/20 194 lb 8 oz (88.2 kg)  08/07/20 199 lb 4.7 oz (90.4 kg)   34.10 kg/m   Last visit pt presented with elevated bp after being off her amlodipine  This was re started last week   BP Readings from Last 3 Encounters:  08/30/20 (!) 141/80  08/25/20 (!) 182/110  08/07/20 (!) 141/91    Much improved so far  Still some intermittent headaches    Pulse Readings from Last 3 Encounters:  08/30/20 89  08/25/20 78  08/07/20 63   Lab Results  Component Value Date   CREATININE 0.73 08/25/2020   BUN 13 08/25/2020   NA 140 08/25/2020   K 4.2 08/25/2020   CL 104 08/25/2020   CO2 27 08/25/2020   Lab Results  Component Value Date   WBC 3.4 (L) 08/07/2020   HGB 12.3 08/07/2020   HCT 40.3 08/07/2020   MCV 80.9 08/07/2020   PLT 132 (L) 08/07/2020   No longer donates blood  Not eating well  Stays quite fatigued     DM2-re started metformin as well after being off of it Lab Results  Component Value Date   HGBA1C 9.1 (A) 08/25/2020   Glucose is still in low 200    Hyperlipidemia Lab Results  Component Value Date   CHOL 186 08/25/2020   HDL 42 (L) 08/25/2020   LDLCALC 122 (H) 08/25/2020   TRIG 109 08/25/2020   CHOLHDL 4.4 08/25/2020  LDL is 122 She loves shrimp    Vit B12 was high  Lab Results  Component Value Date   VITAMINB12 >2,000 (H) 08/25/2020   inst to hold her B12  Iron was slt low Lab Results  Component Value Date   IRON 38 (L) 08/25/2020    TIBC 360 12/01/2008   FERRITIN 13.7 01/12/2015    Patient Active Problem List   Diagnosis Date Noted  . Gastroparesis 08/25/2020  . Hair loss 08/25/2020  . Epigastric pain 08/15/2020  . Anxiety 02/24/2018  . Dysuria 01/30/2018  . 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 Essential hypertension 10/11/2016  . Screening mammogram, encounter for 08/30/2016  . Routine general medical examination at a health care facility 08/30/2016  . Encounter for screening for HIV 08/30/2016  . Need for hepatitis C screening test 08/30/2016  . History of herpes zoster 05/12/2015  . Hyperlipidemia with target low density lipoprotein (LDL) cholesterol less than 100 mg/dL 12/21/2012  . Class 2 severe obesity due to excess calories with serious comorbidity and body mass index (BMI) of 35.0 to 35.9 in adult [facility]) 12/21/2012  . Vaginal atrophy 12/18/2012  . Dyspepsia 08/20/2012  . Rectal bleeding 12/04/2011  . Lower back pain 12/04/2011  . ELEVATED BLOOD PRESSURE 06/04/2010  . ANEMIA, B12 DEFICIENCY 10/05/2008  . THROMBOCYTOPENIA 10/05/2008  . Allergic rhinitis 10/05/2008  . Chronic headache 10/05/2008  . FREQUENCY, URINARY 10/05/2008  . Type 2 diabetes mellitus without complications ([place]) 17/79/3903  . ANEMIA, IRON  DEFICIENCY 01/26/2008   Past Medical History:  Diagnosis Date  . Anal intraepithelial neoplasia II (AIN II)   . Arthritis    shoulder, feet  . B12 deficiency   . Essential hypertension    followed by pcp--- currently no meds,  states when she stopped sinus medication otc , bp better  . High frequency hearing loss   . History of abnormal Pap smear   . History of uterine leiomyoma   . Hx of migraine headaches   . Iron deficiency anemia   . Type 2 diabetes mellitus ([place])    followed by pcp   Past Surgical History:  Procedure Laterality Date  . BUNIONECTOMY Bilateral 1982  . CARDIOVASCULAR STRESS TEST  09-19-2010   @ [place]   normal nuclear study w/ no evidence  ishemia/  normal LV funciton and wall motion , ef 81%  . COLONOSCOPY WITH ESOPHAGOGASTRODUODENOSCOPY (EGD)  last one 08-21-2017  . HIGH RESOLUTION ANOSCOPY N/A 10/09/2017   Procedure: HIGH RESOLUTION ANOSCOPY,  BIOPSY  EXCISION;  Surgeon: Leighton Ruff, MD;  Location: [facility];  Service: General;  Laterality: N/A;  . LAPAROSCOPIC ASSISTED VAGINAL HYSTERECTOMY  09-18-2005    dr Earvin Hansen  . LEEP    . MYOMECTOMY ABDOMINAL APPROACH  09-04-2001    dr [facility]   w/ lysis adhesions  . NASAL ENDOSCOPY  03/2007   neg  . TONSILLECTOMY  child   Social History   Tobacco Use  . Smoking status: Never Smoker  . Smokeless tobacco: Never Used  Vaping Use  . Vaping Use: Never used  Substance Use Topics  . Alcohol use: Yes    Comment: occasional wine  . Drug use: No   Family History  Problem Relation Age of Onset  . Heart disease Sister   . Arthritis Sister   . Diabetes Sister   . Hypertension Sister   . Diabetes Maternal Grandmother   . Hypertension Maternal Grandmother   . Lung cancer Father   . Colon cancer Father   . Sudden death Daughter        after giving birth / ? if blood clot  . Breast cancer Neg Hx   . Esophageal cancer Neg Hx   . Stomach cancer Neg Hx   . Rectal cancer Neg Hx    Allergies  Allergen Reactions  . Lisinopril Cough  . Losartan     Chest pressure   . Amoxicillin-Pot Clavulanate Rash    REACTION: Itchy rash   Current Outpatient Medications on File Prior to Visit  Medication Sig Dispense Refill  . acetaminophen (TYLENOL) 500 MG tablet Take 500 mg by mouth every 6 (six) hours as needed.    Marland Kitchen amLODipine (NORVASC) 5 MG tablet Take 1 tablet (5 mg total) by mouth daily. 30 tablet 3  . diclofenac sodium (VOLTAREN) 1 % GEL Apply 4 g topically 4 (four) times daily. 100 g 2  . fluticasone (FLONASE) 50 MCG/ACT nasal spray Place 2 sprays into both nostrils daily. 16 g 2  . metFORMIN (GLUCOPHAGE) 500 MG tablet Take 1 tablet (500 mg total) by mouth 2  (two) times daily with a meal. 60 tablet 3  . omeprazole (PRILOSEC) 20 MG capsule Take 1 capsule (20 mg total) by mouth 2 (two) times daily before a meal. 60 capsule 0  . ondansetron (ZOFRAN ODT) 4 MG disintegrating tablet Take 1 tablet (4 mg total) by mouth every 8 (eight) hours as needed for nausea or vomiting. 20 tablet 0  No current facility-administered medications on file prior to visit.     Review of Systems  Constitutional: Positive for fatigue. Negative for activity change, appetite change, fever and unexpected weight change.  HENT: Negative for congestion, ear pain, rhinorrhea, sinus pressure and sore throat.   Eyes: Negative for pain, redness and visual disturbance.  Respiratory: Negative for cough, shortness of breath and wheezing.   Cardiovascular: Negative for chest pain and palpitations.  Gastrointestinal: Negative for abdominal pain, blood in stool, constipation and diarrhea.  Endocrine: Negative for polydipsia and polyuria.  Genitourinary: Negative for dysuria, frequency and urgency.  Musculoskeletal: Negative for arthralgias, back pain and myalgias.  Skin: Negative for pallor and rash.  Allergic/Immunologic: Negative for environmental allergies.  Neurological: Positive for headaches. Negative for dizziness and syncope.  Hematological: Negative for adenopathy. Does not bruise/bleed easily.  Psychiatric/Behavioral: Positive for dysphoric mood. Negative for decreased concentration. The patient is not nervous/anxious.        Objective:   Physical Exam Constitutional:      General: She is not in acute distress.    Appearance: Normal appearance. She is well-developed. She is obese. She is not ill-appearing or diaphoretic.  HENT:     Head: Normocephalic and atraumatic.  Eyes:     Conjunctiva/sclera: Conjunctivae normal.     Pupils: Pupils are equal, round, and reactive to light.  Neck:     Thyroid: No thyromegaly.     Vascular: No carotid bruit or JVD.   Cardiovascular:     Rate and Rhythm: Normal rate and regular rhythm.     Heart sounds: Normal heart sounds. No gallop.   Pulmonary:     Effort: Pulmonary effort is normal. No respiratory distress.     Breath sounds: Normal breath sounds. No wheezing or rales.  Abdominal:     General: Bowel sounds are normal. There is no distension or abdominal bruit.     Palpations: Abdomen is soft. There is no mass.     Tenderness: There is no abdominal tenderness.  Musculoskeletal:     Cervical back: Normal range of motion and neck supple.     Right lower leg: No edema.     Left lower leg: No edema.  Lymphadenopathy:     Cervical: No cervical adenopathy.  Skin:    General: Skin is warm and dry.     Coloration: Skin is not pale.     Findings: No erythema or rash.  Neurological:     Mental Status: She is alert.     Cranial Nerves: No cranial nerve deficit.     Coordination: Coordination normal.     Deep Tendon Reflexes: Reflexes are normal and symmetric. Reflexes normal.  Psychiatric:        Mood and Affect: Mood normal.           Assessment & Plan:   Problem List Items Addressed This Visit      Cardiovascular and Mediastinum   Essential hypertension - Primary    Much improvement in under a week starting amlodipine bp in fair control at this time  BP Readings from Last 1 Encounters:  08/30/20 (!) 141/80   No changes needed Most recent labs reviewed  Disc lifstyle change with low sodium diet and exercise   Will re check in 2 mo and inc dose if not at goal  Of note-cannot take ace or arb      Relevant Orders   CBC with Differential/Platelet   Lipid panel   Basic metabolic panel  Digestive   Gastroparesis    Still symptomatic  Working on DM control  Urgent ref to GI made prior and waiting to get appt        Endocrine   Type 2 diabetes mellitus without complications ([place])    Just started metformin 500 mg bid  Glucose is in low 200s If still high in a week will inc  to 1000 mg bid  F/u 2 mo  Cannot take ace or arb Will discuss statin next time      Relevant Orders   Hemoglobin A1c     Other   ANEMIA, IRON DEFICIENCY    Cbc is ok but iron level is mildly low  Lab Results  Component Value Date   IRON 38 (L) 08/25/2020   TIBC 360 12/01/2008   FERRITIN 13.7 01/12/2015   Will start niferex 150 mg every other day  Re check in 2 mo      Relevant Medications   iron polysaccharides (NIFEREX) 150 MG capsule   Other Relevant Orders   CBC with Differential/Platelet   Ferritin   Iron   ANEMIA, B12 DEFICIENCY    Lab Results  Component Value Date   VITAMINB12 >2,000 (H) 08/25/2020   Too high Pt does not know how much she is taking  Will hold for 2 mo and re check then adv from there      Relevant Medications   iron polysaccharides (NIFEREX) 150 MG capsule   Other Relevant Orders   Vitamin B12

## 2020-08-30 NOTE — Assessment & Plan Note (Signed)
Cbc is ok but iron level is mildly low  Lab Results  Component Value Date   IRON 38 (L) 08/25/2020   TIBC 360 12/01/2008   FERRITIN 13.7 01/12/2015   Will start niferex 150 mg every other day  Re check in 2 mo

## 2020-08-30 NOTE — Patient Instructions (Addendum)
Hold the B12 -your level is too high  Start the iron every other day   We will work on a GI referral   Eat more fish but less shellfish   Take the metformin 500 mg twice daily for another week If glucose is still high -then go up to 1000 mg twice daily and then let me know when you need a refill   BP looks much better

## 2020-08-30 NOTE — Assessment & Plan Note (Signed)
Much improvement in under a week starting amlodipine bp in fair control at this time  BP Readings from Last 1 Encounters:  08/30/20 (!) 141/80   No changes needed Most recent labs reviewed  Disc lifstyle change with low sodium diet and exercise   Will re check in 2 mo and inc dose if not at goal  Of note-cannot take ace or arb

## 2020-08-30 NOTE — Assessment & Plan Note (Signed)
Just started metformin 500 mg bid  Glucose is in low 200s If still high in a week will inc to 1000 mg bid  F/u 2 mo  Cannot take ace or arb Will discuss statin next time

## 2020-09-28 ENCOUNTER — Ambulatory Visit: Payer: No Typology Code available for payment source | Admitting: Nurse Practitioner

## 2020-09-28 ENCOUNTER — Encounter: Payer: Self-pay | Admitting: Nurse Practitioner

## 2020-09-28 VITALS — BP 124/82 | HR 77 | Ht 63.0 in | Wt 191.8 lb

## 2020-09-28 DIAGNOSIS — R1310 Dysphagia, unspecified: Secondary | ICD-10-CM

## 2020-09-28 DIAGNOSIS — R1013 Epigastric pain: Secondary | ICD-10-CM

## 2020-09-28 NOTE — Patient Instructions (Signed)
You have been scheduled for an endoscopy. Please follow written instructions given to you at your visit today. If you use inhalers (even only as needed), please bring them with you on the day of your procedure.  If you are age 60 or older, your body mass index should be between 23-30. Your Body mass index is 33.98 kg/m. If this is out of the aforementioned range listed, please consider follow up with your Primary Care Provider.  If you are age 42 or younger, your body mass index should be between 19-25. Your Body mass index is 33.98 kg/m. If this is out of the aformentioned range listed, please consider follow up with your Primary Care Provider.   __________________________________________________________  The [facility] providers would like to encourage you to use [facility] to communicate with providers for non-urgent requests or questions.  Due to long hold times on the telephone, sending your provider a message by [facility] may be a faster and more efficient way to get a response.  Please allow 48 business hours for a response.  Please remember that this is for non-urgent requests.

## 2020-09-28 NOTE — Progress Notes (Signed)
ASSESSMENT AND PLAN    # 60 year old female with a 33-month history of intermittent nausea,  epigastric pain exacerbated by eating and weight loss. Labs and RUQ [place] in ED early May were unremarkable. History of NSAID use. Rule out PUD.   --Negative Clo test on last EGD ( for dysphagia)  in May 2019 --For further evaluation patient will be scheduled for an EGD. The risks and benefits of EGD with possible biopsies was discussed with the patient and she agrees to proceed.   Dr. Silverio Decamp does not have any openings in her endoscopy schedule for the next few weeks.  Dr. Tarri Glenn will perform the EGD in the morning --If EGD unrevealing and symptoms persist, consider CT scan --No NSAIDs for now.  --She is working on getting better control of blood sugars.  We discussed that hyperglycemia can cause delayed gastric emptying  # Chronic dysphagia to both solids and liquids.  Unrevealing EGD for dysphagia in 2019.  Symptoms have progressed. Points to epigastrium and describes feeling like food is just "sitting there".  She also clearly describes food and liquids getting stuck in her chest --Further evaluation at time of EGD.  If no esophageal strictures or other lesions then consider barium swallow or esophageal manometry  # DM2, on metformin.  Hemoglobin A1c late May was 9.1.   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 Surveillance needed  HISTORY OF PRESENT ILLNESS     Chief Complaint : Epigastric pain and swallowing problems  [name] is a 60 y.o. female known to Dr. Silverio Decamp.  She has a past medical history significant for HTN, DM2, AIN III.  See PMH below for any additional history.   Patient known to Dr. Silverio Decamp. She has a history of AIN-III found on colonoscopy in 2019. EGD on the same day for evaluation for evaluation of dysphagia was unremarkable except for a small hiatal hernia and gastric erythema.  CLOtest negative.  Patient has not been seen  here since those procedures.  Interval History:  In late April / early May,  while at work , patient developed upper abdominal pain, nausea / vomiting . Vomiting resolved after 24 hours but she has continued to have occasional nausea and also non-radiating epigastric pain. The pain is aggravated by eating but can also occur in a fasting state.   She has unintentionally lost 10 pounds. She takes [employer] about twice a month. No black stools or overt GI bleeding.  Lunette was seen in the ED on 08/07/2020 for evaluation of these symptoms.  Labs and RUQ ultrasound were unremarkable.   Additionally, patient feels like liquids and solids get stuck in her chest . No pyrosis or heartburn.  Unrevealing EGD for dysphagia in 2019. Symptoms never resolved but were much more manageable until just recently.     Data Reviewed:  08/07/20  CBC -ok Lipase and LFTs normal.   08/07/20 RUQ [place] Mild fatty liver. No gallstones. No gallbladder wall thickening.   PREVIOUS EVALUATIONS:   May 2019 EGD for dysphagia --small hiatal hernia.  Erythematous gastric mucosa.  Negative Clo test  May 2019 screening colonoscopy -Nodular ileal mucosa. Biopsies c/w lymphoid tissue. - Non-bleeding internal hemorrhoids. - Granularity at the anus. Biopsied.   Diagnosis 1. Surgical [P], terminal ileum - BENIGN SMALL BOWEL MUCOSA WITH LYMPHOID TISSUE. - NO DYSPLASIA OR MALIGNANCY. 2. Surgical [P], anal - HIGH GRADE SQUAMOUS INTRAEPITHELIAL LESION (AIN-2, MODERATE DYSPLASIA).  Subsequent biopsies by Dr. Marcello Moores  showed AIN-3   Past Medical History:  Diagnosis Date   Anal intraepithelial neoplasia II (AIN II)    Arthritis    shoulder, feet   B12 deficiency    Essential hypertension    followed by pcp--- currently no meds,  states when she stopped sinus medication otc , bp better   High frequency hearing loss    History of abnormal Pap smear    History of uterine leiomyoma    Hx of migraine headaches    Iron  deficiency anemia    Type 2 diabetes mellitus ([place])    followed by pcp     Past Surgical History:  Procedure Laterality Date   BUNIONECTOMY Bilateral 1982   CARDIOVASCULAR STRESS TEST  09-19-2010   @ [place]   normal nuclear study w/ no evidence ishemia/  normal LV funciton and wall motion , ef 81%   COLONOSCOPY WITH ESOPHAGOGASTRODUODENOSCOPY (EGD)  last one 08-21-2017   HIGH RESOLUTION ANOSCOPY N/A 10/09/2017   Procedure: HIGH RESOLUTION ANOSCOPY,  BIOPSY  EXCISION;  Surgeon: Leighton Ruff, MD;  Location: [facility];  Service: General;  Laterality: N/A;   LAPAROSCOPIC ASSISTED VAGINAL HYSTERECTOMY  09-18-2005    dr Kennon Rounds  [place]  09-04-2001    dr [facility]   w/ lysis adhesions   NASAL ENDOSCOPY  03/2007   neg   TONSILLECTOMY  child   Family History  Problem Relation Age of Onset   Heart disease Sister    Arthritis Sister    Diabetes Sister    Hypertension Sister    Diabetes Maternal Grandmother    Hypertension Maternal Grandmother    Lung cancer Father    Colon cancer Father    Sudden death Daughter        after giving birth / ? if blood clot   Breast cancer Neg Hx    Esophageal cancer Neg Hx    Stomach cancer Neg Hx    Rectal cancer Neg Hx    Social History   Tobacco Use   Smoking status: Never    Passive exposure: Never   Smokeless tobacco: Never  Vaping Use   Vaping Use: Never used  Substance Use Topics   Alcohol use: Yes    Alcohol/week: 2.0 standard drinks    Types: 2 Standard drinks or equivalent per week   Drug use: No   Current Outpatient Medications  Medication Sig Dispense Refill   amLODipine (NORVASC) 5 MG tablet Take 1 tablet (5 mg total) by mouth daily. 30 tablet 3   diclofenac sodium (VOLTAREN) 1 % GEL Apply 4 g topically 4 (four) times daily. 100 g 2   fluticasone (FLONASE) 50 MCG/ACT nasal spray Place 2 sprays into both nostrils daily. 16 g 2   loratadine (CLARITIN) 10 MG tablet Take 10 mg by  mouth daily.     metFORMIN (GLUCOPHAGE) 500 MG tablet Take 1 tablet (500 mg total) by mouth 2 (two) times daily with a meal. 60 tablet 3   Polyethylene Glycol 3350 (MIRALAX PO) Take 1 Dose by mouth daily.     No current facility-administered medications for this visit.   Allergies  Allergen Reactions   Lisinopril Cough   Losartan     Chest pressure    Amoxicillin-Pot Clavulanate Rash    REACTION: Itchy rash     Review of Systems: Positive for arthritis, vision changes, headaches, night sweats, excessive urination.  All other systems reviewed and  negative except where noted in HPI.    PHYSICAL EXAM :    Wt Readings from Last 3 Encounters:  09/28/20 191 lb 12.8 oz (87 kg)  08/30/20 192 lb 8 oz (87.3 kg)  08/25/20 194 lb 8 oz (88.2 kg)    BP 124/82   Pulse 77   Ht 5\' 3"  (1.6 m)   Wt 191 lb 12.8 oz (87 kg)   SpO2 98%   BMI 33.98 kg/m  Constitutional:  Pleasant female in no acute distress. Psychiatric: Normal mood and affect. Behavior is normal. EENT: Pupils normal.  Conjunctivae are normal. No scleral icterus. Neck supple.  Cardiovascular: Normal rate, regular rhythm. No edema Pulmonary/chest: Effort normal and breath sounds normal. No wheezing, rales or rhonchi. Abdominal: Soft, nondistended, mild epigastric tenderness. Bowel sounds active throughout. There are no masses palpable. No hepatomegaly. Neurological: Alert and oriented to person place and time. Skin: Skin is warm and dry. No rashes noted.  Tye Savoy, NP  09/28/2020, 9:46 AM  Cc:  Tower, Wynelle Fanny, MD

## 2020-09-29 ENCOUNTER — Other Ambulatory Visit: Payer: Self-pay | Admitting: Gastroenterology

## 2020-09-29 ENCOUNTER — Other Ambulatory Visit: Payer: Self-pay

## 2020-09-29 ENCOUNTER — Ambulatory Visit (AMBULATORY_SURGERY_CENTER): Payer: No Typology Code available for payment source | Admitting: Gastroenterology

## 2020-09-29 ENCOUNTER — Encounter: Payer: Self-pay | Admitting: Gastroenterology

## 2020-09-29 VITALS — BP 148/93 | HR 70 | Temp 96.6°F | Resp 18 | Ht 63.0 in | Wt 191.0 lb

## 2020-09-29 DIAGNOSIS — K319 Disease of stomach and duodenum, unspecified: Secondary | ICD-10-CM | POA: Diagnosis not present

## 2020-09-29 DIAGNOSIS — K31A0 Gastric intestinal metaplasia, unspecified: Secondary | ICD-10-CM

## 2020-09-29 DIAGNOSIS — K2970 Gastritis, unspecified, without bleeding: Secondary | ICD-10-CM

## 2020-09-29 DIAGNOSIS — K259 Gastric ulcer, unspecified as acute or chronic, without hemorrhage or perforation: Secondary | ICD-10-CM

## 2020-09-29 DIAGNOSIS — K2950 Unspecified chronic gastritis without bleeding: Secondary | ICD-10-CM

## 2020-09-29 DIAGNOSIS — R1013 Epigastric pain: Secondary | ICD-10-CM

## 2020-09-29 MED ORDER — PANTOPRAZOLE SODIUM 40 MG PO TBEC: 40.0000 mg | DELAYED_RELEASE_TABLET | Freq: Two times a day (BID) | ORAL | 1 refills | Status: DC

## 2020-09-29 MED ORDER — SODIUM CHLORIDE 0.9 % IV SOLN: 500.0000 mL | Freq: Once | INTRAVENOUS | Status: DC

## 2020-09-29 NOTE — Progress Notes (Signed)
[place] 

## 2020-09-29 NOTE — Progress Notes (Signed)
Pt.'s driver not in the parking lot at time of discharge.  Pt. Held in RR until driver returns.

## 2020-09-29 NOTE — Op Note (Addendum)
[place] Patient Name: [name] Procedure Date: 09/29/2020 7:23 AM MRN: [id number] Endoscopist: Thornton Park MD, MD Age: 60 Referring MD:  Date of Birth: 07-11-1960 Gender: Female Account #: [id number] Procedure:                Upper GI endoscopy Indications:              Dysphagia present for years but recently worse, New                            onset epigastric pain                           EGD with Dr. Nyoka Cowden 2019 - no source for                            dysphagia, CLO negative                           Normal barium esophagram at [facility] in 2014                           Patient reports occassional use of Goody's Medicines:                Monitored Anesthesia Care Procedure:                Pre-Anesthesia Assessment:                           - Prior to the procedure, a History and Physical                            was performed, and patient medications and                            allergies were reviewed. The patient's tolerance of                            previous anesthesia was also reviewed. The risks                            and benefits of the procedure and the sedation                            options and risks were discussed with the patient.                            All questions were answered, and informed consent                            was obtained. Prior Anticoagulants: The patient has                            taken no previous anticoagulant or antiplatelet  agents. ASA Grade Assessment: II - A patient with                            mild systemic disease. After reviewing the risks                            and benefits, the patient was deemed in                            satisfactory condition to undergo the procedure.                           After obtaining informed consent, the endoscope was                            passed under direct vision. Throughout the                             procedure, the patient's blood pressure, pulse, and                            oxygen saturations were monitored continuously. The                            GIF [id number] #[id number] was introduced through the                            mouth, and advanced to the third part of duodenum.                            The upper GI endoscopy was accomplished without                            difficulty. The patient tolerated the procedure                            well. Scope In: Scope Out: Findings:                 The examined esophagus was normal. The z-line is                            located 35 cm from the incisors. The lumen is                            normal without ring, web, or stricture. The                            esophageal mucosa was also normal. Biopsies were                            obtained from the proximal and distal esophagus  with cold forceps for histology of suspected                            eosinophilic esophagitis.                           Diffuse moderate to severe inflammation                            characterized by erythema, friability and                            granularity was found in the gastric body and in                            the gastric antrum. There was sparing at the                            pylorus. Biopsies were taken from the antrum, body,                            and fundus with a cold forceps for histology.                            Estimated blood loss was minimal.                           The examined duodenum was normal.                           The cardia and gastric fundus were normal on                            retroflexion.                           The exam was otherwise without abnormality. Complications:            No immediate complications. Estimated blood loss:                            Minimal. Estimated Blood Loss:     Estimated blood loss was minimal. Impression:               -  Normal esophagus. Biopsied.                           - Moderate to severe gastritis. This was not                            present on EGD with Dr. Silverio Decamp in 2019. Biopsied.                           - Normal examined duodenum. Recommendation:           - Patient has a contact number available for  emergencies. The signs and symptoms of potential                            delayed complications were discussed with the                            patient. Return to normal activities tomorrow.                            Written discharge instructions were provided to the                            patient.                           - Resume previous diet.                           - Continue present medications.                           - Start pantoprazole 40 mg BID for at least 10                            weeks.                           - No aspirin, ibuprofen, naproxen, or other                            non-steroidal anti-inflammatory drugs including                            Goody's.                           - Await pathology results. Thornton Park MD, MD 09/29/2020 7:53:42 AM This report has been signed electronically.

## 2020-09-29 NOTE — Progress Notes (Signed)
Reviewed and agree with management plans. Patient of Dr. Woodward Ku. I am happy to proceed with endoscopy with possible dilation and biopsies tomorrow to facilitate her evaluation and treatment as I have some last minute availability in my endoscopy schedule.   Jeroline L. Tarri Glenn, MD, MPH

## 2020-09-29 NOTE — Progress Notes (Signed)
A and O x3. Report to RN. Tolerated MAC anesthesia well.Teeth unchanged after procedure. 

## 2020-09-29 NOTE — Progress Notes (Signed)
Pt. Reports her lip hurting.  Mid lower lip - mild erythema, 30mm mid lower lip 1+ swelling.  No bleeding noted.  No break in skin integrity noted.  Told pt. To apply Vaseline to her lip at home if she wanted to.

## 2020-09-29 NOTE — Patient Instructions (Signed)
Impression/Recommendations:  Gastritis handout given to patient.  Resume previous diet. Continue present medications.  Start Pantoprazole 40 mg twice daily for at least 10 weeks.  No aspirin, ibuprofen, naproxen, or other non-steroidal anti-inflammatory drugs.  Await pathology results.  YOU HAD AN ENDOSCOPIC PROCEDURE TODAY AT [facility]:   Refer to the procedure report that was given to you for any specific questions about what was found during the examination.  If the procedure report does not answer your questions, please call your gastroenterologist to clarify.  If you requested that your care partner not be given the details of your procedure findings, then the procedure report has been included in a sealed envelope for you to review at your convenience later.  YOU SHOULD EXPECT: Some feelings of bloating in the abdomen. Passage of more gas than usual.  Walking can help get rid of the air that was put into your GI tract during the procedure and reduce the bloating. If you had a lower endoscopy (such as a colonoscopy or flexible sigmoidoscopy) you may notice spotting of blood in your stool or on the toilet paper. If you underwent a bowel prep for your procedure, you may not have a normal bowel movement for a few days.  Please Note:  You might notice some irritation and congestion in your nose or some drainage.  This is from the oxygen used during your procedure.  There is no need for concern and it should clear up in a day or so.  SYMPTOMS TO REPORT IMMEDIATELY:   Following upper endoscopy (EGD)  Vomiting of blood or coffee ground material  New chest pain or pain under the shoulder blades  Painful or persistently difficult swallowing  New shortness of breath  Fever of 100F or higher  Black, tarry-looking stools  For urgent or emergent issues, a gastroenterologist can be reached at any hour by calling [phone]. Do not use MyChart messaging for urgent  concerns.    DIET:  We do recommend a small meal at first, but then you may proceed to your regular diet.  Drink plenty of fluids but you should avoid alcoholic beverages for 24 hours.  ACTIVITY:  You should plan to take it easy for the rest of today and you should NOT DRIVE or use heavy machinery until tomorrow (because of the sedation medicines used during the test).    FOLLOW UP: Our staff will call the number listed on your records 48-72 hours following your procedure to check on you and address any questions or concerns that you may have regarding the information given to you following your procedure. If we do not reach you, we will leave a message.  We will attempt to reach you two times.  During this call, we will ask if you have developed any symptoms of COVID 19. If you develop any symptoms (ie: fever, flu-like symptoms, shortness of breath, cough etc.) before then, please call [phone].  If you test positive for Covid 19 in the 2 weeks post procedure, please call and report this information to [place].    If any biopsies were taken you will be contacted by phone or by letter within the next 1-3 weeks.  Please call us at [phone] if you have not heard about the biopsies in 3 weeks.    SIGNATURES/CONFIDENTIALITY: You and/or your care partner have signed paperwork which will be entered into your electronic medical record.  These signatures attest to the fact that that the information above  on your After Visit Summary has been reviewed and is understood.  Full responsibility of the confidentiality of this discharge information lies with you and/or your care-partner.

## 2020-09-29 NOTE — Progress Notes (Signed)
Called to room to assist during endoscopic procedure.  Patient ID and intended procedure confirmed with present staff. Received instructions for my participation in the procedure from the performing physician.  

## 2020-10-03 ENCOUNTER — Other Ambulatory Visit: Payer: Self-pay

## 2020-10-03 ENCOUNTER — Telehealth: Payer: Self-pay

## 2020-10-03 ENCOUNTER — Ambulatory Visit (INDEPENDENT_AMBULATORY_CARE_PROVIDER_SITE_OTHER): Payer: No Typology Code available for payment source | Admitting: Family Medicine

## 2020-10-03 ENCOUNTER — Other Ambulatory Visit (HOSPITAL_COMMUNITY)
Admission: RE | Admit: 2020-10-03 | Discharge: 2020-10-03 | Disposition: A | Discharge status: Home / Self Care | Payer: PRIVATE HEALTH INSURANCE | Source: Ambulatory Visit | Attending: Family Medicine | Admitting: Family Medicine

## 2020-10-03 ENCOUNTER — Encounter: Payer: Self-pay | Admitting: Family Medicine

## 2020-10-03 VITALS — BP 129/79 | HR 82 | Ht 63.0 in | Wt 193.0 lb

## 2020-10-03 DIAGNOSIS — N898 Other specified noninflammatory disorders of vagina: Secondary | ICD-10-CM | POA: Diagnosis not present

## 2020-10-03 DIAGNOSIS — Z01419 Encounter for gynecological examination (general) (routine) without abnormal findings: Secondary | ICD-10-CM

## 2020-10-03 DIAGNOSIS — R3589 Other polyuria: Secondary | ICD-10-CM

## 2020-10-03 NOTE — Progress Notes (Signed)
Subjective:     [name] is a 60 y.o. female and is here for a comprehensive physical exam. The patient reports problems - urinary frequency and vaginal irritation . Status post LAVH in 2007. No uterus and no need for pap smears. Her PCP thought her frequency was due to A1C which is 9.3 on last check and doubled her Metformin. She reports CBGs are in the 190 range daily.   The following portions of the patient's history were reviewed and updated as appropriate: allergies, current medications, past family history, past medical history, past social history, past surgical history, and problem list.  Review of Systems Pertinent items noted in HPI and remainder of comprehensive ROS otherwise negative.   Objective:    BP 129/79   Pulse 82   Ht 5\' 3"  (1.6 m)   Wt 193 lb (87.5 kg)   BMI 34.19 kg/m  General appearance: alert, cooperative, and appears stated age Head: Normocephalic, without obvious abnormality, atraumatic Neck: no adenopathy, supple, symmetrical, trachea midline, and thyroid not enlarged, symmetric, no tenderness/mass/nodules Lungs: clear to auscultation bilaterally Breasts: normal appearance, no masses or tenderness Heart: regular rate and rhythm, S1, S2 normal, no murmur, click, rub or gallop Abdomen: soft, non-tender; bowel sounds normal; no masses,  no organomegaly Pelvic: external genitalia normal, no adnexal masses or tenderness, uterus surgically absent, vagina normal without discharge, and cuff is intact Extremities: extremities normal, atraumatic, no cyanosis or edema Pulses: 2+ and symmetric Skin: Skin color, texture, turgor normal. No rashes or lesions Lymph nodes: Cervical, supraclavicular, and axillary nodes normal. Neurologic: Grossly normal     Urinalysis Negative  Assessment:    GYN female exam.      Plan:  Vaginal irritation - nothing obvious on exam--will check cultures - Plan: Cervicovaginal ancillary only( [place])  Encounter for  gynecological examination without abnormal finding - Plan: MM 3D SCREEN BREAST BILATERAL  Polyuria - U/A is negative, check culture, suspect due to poor glycemic control - Plan: Urine Culture    See After Visit Summary for Counseling Recommendations

## 2020-10-03 NOTE — Telephone Encounter (Signed)
Request for authorization has been entered through [employer] [email or web address]). Prior Auth ID is [id number]. Patient is aware that we are currently working on getting her approved.

## 2020-10-04 ENCOUNTER — Telehealth: Payer: Self-pay | Admitting: Radiology

## 2020-10-04 ENCOUNTER — Telehealth: Payer: Self-pay

## 2020-10-04 LAB — URINE CULTURE

## 2020-10-04 NOTE — Telephone Encounter (Signed)
  Follow up Call-  Call back number 09/29/2020  Post procedure Call Back phone  # [phone]  Permission to leave phone message Yes  Some recent data might be hidden     Patient questions:  Do you have a fever, pain , or abdominal swelling? No. Pain Score  0 *  Have you tolerated food without any problems? Yes.    Have you been able to return to your normal activities? Yes.    Do you have any questions about your discharge instructions: Diet   No. Medications  No. Follow up visit  No.  Do you have questions or concerns about your Care? No.  Actions: * If pain score is 4 or above: No action needed, pain <4. Have you developed a fever since your procedure? no  2.   Have you had an respiratory symptoms (SOB or cough) since your procedure? no  3.   Have you tested positive for COVID 19 since your procedure no  4.   Have you had any family members/close contacts diagnosed with the COVID 19 since your procedure?  no   If yes to any of these questions please route to Joylene John, RN and Joella Prince, RN

## 2020-10-04 NOTE — Telephone Encounter (Signed)
Called patient about scheduling mammo, patient stated that she will schedule appointment.

## 2020-10-05 LAB — CERVICOVAGINAL ANCILLARY ONLY
Bacterial Vaginitis (gardnerella): NEGATIVE
Candida Glabrata: NEGATIVE
Candida Vaginitis: NEGATIVE
Chlamydia: NEGATIVE
Comment: NEGATIVE
Comment: NEGATIVE
Comment: NEGATIVE
Comment: NEGATIVE
Comment: NEGATIVE
Comment: NORMAL
Neisseria Gonorrhea: NEGATIVE
Trichomonas: NEGATIVE

## 2020-10-05 NOTE — Telephone Encounter (Signed)
APPROVAL  Medication: Omeprazole 40mg  Insurance Company: [employer]  PA response: APPROVED Approval dates: 10/03/2020 through 10/02/2021 Misc. Notes: PA confirmation has been labeled and sent to HIM for scanning purposes and for our future reference.

## 2020-10-25 NOTE — Progress Notes (Signed)
Reviewed and agree with documentation and assessment and plan. K. Veena Ash Mcelwain , MD   

## 2020-10-29 ENCOUNTER — Other Ambulatory Visit: Payer: Self-pay | Admitting: Family Medicine

## 2020-10-31 ENCOUNTER — Other Ambulatory Visit: Payer: Self-pay | Admitting: Family Medicine

## 2020-10-31 ENCOUNTER — Encounter: Payer: Self-pay | Admitting: Family Medicine

## 2020-10-31 MED ORDER — METFORMIN HCL 1000 MG PO TABS: 1000.0000 mg | ORAL_TABLET | Freq: Two times a day (BID) | ORAL | 1 refills | Status: DC

## 2020-11-20 ENCOUNTER — Other Ambulatory Visit: Payer: Self-pay | Admitting: Family Medicine

## 2020-11-25 ENCOUNTER — Other Ambulatory Visit: Payer: Self-pay | Admitting: Gastroenterology

## 2020-12-12 ENCOUNTER — Other Ambulatory Visit: Payer: Self-pay

## 2020-12-12 ENCOUNTER — Telehealth: Payer: Self-pay | Admitting: Family Medicine

## 2020-12-12 ENCOUNTER — Ambulatory Visit
Admission: RE | Admit: 2020-12-12 | Discharge: 2020-12-12 | Disposition: A | Discharge status: Home / Self Care | Payer: No Typology Code available for payment source | Source: Ambulatory Visit | Attending: Emergency Medicine | Admitting: Emergency Medicine

## 2020-12-12 VITALS — BP 158/88 | HR 90 | Temp 98.8°F | Resp 18

## 2020-12-12 DIAGNOSIS — U071 COVID-19: Secondary | ICD-10-CM

## 2020-12-12 MED ORDER — PAXLOVID (300/100) 20 X 150 MG & 10 X 100MG PO TBPK: ORAL_TABLET | ORAL | 0 refills | Status: DC

## 2020-12-12 MED ORDER — BENZONATATE 100 MG PO CAPS: 100.0000 mg | ORAL_CAPSULE | Freq: Three times a day (TID) | ORAL | 0 refills | Status: DC | PRN

## 2020-12-12 NOTE — Discharge Instructions (Signed)
Please keep well-hydrated and get plenty of rest. Start a saline nasal rinse to flush out your nasal passages. You can use plain Mucinex (or generic guaifenesin) to help thin congestion. Please take prescribed medications as directed.  You were to quarantine for 5 days from onset of your symptoms.  After day 5, if you have had no fever and you are feeling better, you can end quarantine but need to mask for an additional 5 days. After day 5 if you have a fever or are having significant symptoms, please quarantine for full 10 days.  If you note any worsening of symptoms, any significant shortness of breath or any chest pain, please seek ER evaluation ASAP.  Please do not delay care!

## 2020-12-12 NOTE — ED Triage Notes (Signed)
Pt is present today with chest congestion, HA, and cough. Pt states that sx started Monday. Pt states that she tested positive this morning for covid

## 2020-12-12 NOTE — Telephone Encounter (Signed)
I spoke with pt who did home covid test on 12/11/20 that was + and pt went to [facility] this morning and just got + covid report. Pts symptoms started on 12/11/20; pt having very frequent dry cough, SOB while talking on phone; head congestion, sweating, H/A with pain level of 8 - 9. S/T that hurts to swallow. Pt has not had fever, chills, body aches, diarrhea or vomiting, and no loss of taste or smell. No available appts at [facility] today and with frequent coughing, SOB sitting and H/A pt will go to [facility] 12/12/20 at 12:15. Self quarantine, drink plenty of fluids, rest, and take Tylenol for fever. UC & ED precautions given and pt voiced understanding. Sending note to Dr Glori Bickers and [place] CMA. Will also teams [facility] CMA.

## 2020-12-12 NOTE — Telephone Encounter (Signed)
Aware, will watch for correspondence  

## 2020-12-12 NOTE — Telephone Encounter (Signed)
[name] - Client TELEPHONE ADVICE RECORD [facility] Patient Name: [name] ER Gender: Female DOB: [birth year] Age: 60 Y 11 M 24 D Return Phone Number: [phone] (Primary) Address: City/ State/ Zip: [place] [address] Client [name] - Client Client Site [place] MD Contact Type Call Who Is Calling Patient / Member / Family / Caregiver Call Type Triage / Clinical Relationship To Patient Self Return Phone Number [phone] (Primary) Chief Complaint Cough Reason for Call Symptomatic / Request for [place] states, patient tested positive for COVID. has a bad cough. Office states not available appt. today. Congestion. Translation No Nurse Assessment Nurse: Kirk Ruths, RN, [place] [facility] Date/Time (Eastern Time): 12/12/2020 9:12:20 AM Confirm and document reason for call. If symptomatic, describe symptoms. ---Caller states she is covid positive since day before yesterday tested at home positive yesterday has a bad cough dry cough that causes her to gag nasal stuffy no fever Does the patient have any new or worsening symptoms? ---Yes Will a triage be completed? ---Yes Related visit to physician within the last 2 weeks? ---No Does the PT have any chronic conditions? (i.e. diabetes, asthma, this includes High risk factors for pregnancy, etc.) ---Yes List chronic conditions. ---dm htn Is this a behavioral health or substance abuse call? ---No Guidelines Guideline Title Affirmed Question Affirmed Notes Nurse Date/Time (Eastern Time) COVID-19 - Diagnosed or Suspected [1] Continuous (nonstop) coughing interferes with work or school AND [2] no improvement using cough treatment per Care Advice Kirk Ruths, RN, [place] [facility] 12/12/2020 9:14:19 AM PLEASE NOTE: All timestamps contained within this report are represented as  [place] Standard Time. CONFIDENTIALTY NOTICE: This fax transmission is intended only for the addressee. It contains information that is legally privileged, confidential or otherwise protected from use or disclosure. If you are not the intended recipient, you are strictly prohibited from reviewing, disclosing, copying using or disseminating any of this information or taking any action in reliance on or regarding this information. If you have received this fax in error, please notify us immediately by telephone so that we can arrange for its return to [place]. Phone: [phone], Toll-Free: [phone], Fax: [phone] Page: 2 of 3 Call Id: [id number] [facility]. Time Eilene Ghazi Time) Disposition Final User 12/12/2020 9:20:32 AM Call PCP within 24 Hours Yes Kirk Ruths, RN, [place] [facility] Caller Disagree/Comply Comply Caller Understands Yes PreDisposition Call Doctor Care Advice Given Per Guideline CALL PCP WITHIN 24 HOURS: * You need to discuss this with your doctor (or NP/PA) within the next 24 hours. * IF OFFICE WILL BE OPEN: Call the office when it opens tomorrow morning. GENERAL CARE ADVICE FOR COVID-19 SYMPTOMS: * The symptoms are generally treated the same whether you have COVID-19, influenza or some other respiratory virus. * Cough: Use cough drops. * Feeling dehydrated: Drink extra liquids. If the air in your home is dry, use a humidifier. * Fever: For fever over 101 F (38.3 C), take acetaminophen every 4 to 6 hours (Adults 650 mg) OR ibuprofen every 6 to 8 hours (Adults 400 mg). Before taking any medicine, read all the instructions on the package. Do not take aspirin unless your doctor has prescribed it for you. * Muscle aches, headache, and other pains: Often this comes and goes with the fever. Take acetaminophen every 4 to 6 hours (Adults 650 mg) OR ibuprofen every 6 to 8 hours (Adults 400 mg). Before taking any medicine, read all the instructions on  the package. * Sore throat: Try throat lozenges, hard candy  or warm chicken broth. COUGH MEDICINES: * COUGH DROPS: Over-the-counter cough drops can help a lot, especially for mild coughs. They soothe an irritated throat and remove the tickle sensation in the back of the throat. Cough drops are easy to carry with you. * COUGH SYRUP WITH DEXTROMETHORPHAN: An over-the-counter cough syrup can help your cough. The most common cough suppressant in over-the-counter cough medicines is dextromethorphan. * HOME REMEDY - HARD CANDY: Hard candy works just as well as over-the-counter cough drops. People who have diabetes should use sugar-free candy. CALL BACK IF: * Fever over 103 F (39.4 C) * Chest pain or difficulty breathing occurs * You become worse CARE ADVICE given per COVID-19 - DIAGNOSED OR SUSPECTED (Adult) guideline. FEVER MEDICINES: * For fevers above 101 F (38.3 C) take either acetaminophen or ibuprofen. * They are over-the-counter (OTC) drugs that help treat both fever and pain. You can buy them at the drugstore. * The goal of fever therapy is to bring the fever down to a comfortable level. Remember that fever medicine usually lowers fever 2 degrees F (1 - 1 1/2 degrees C). * ACETAMINOPHEN - REGULAR STRENGTH TYLENOL: Take 650 mg (two 325 mg pills) by mouth every 4 to 6 hours as needed. Each Regular Strength Tylenol pill has 325 mg of acetaminophen. The most you should take each day is 3,250 mg (10 pills a day). * ACETAMINOPHEN - EXTRA STRENGTH TYLENOL: Take 1,000 mg (two 500 mg pills) every 8 hours as needed. Each Extra Strength Tylenol pill has 500 mg of acetaminophen. The most you should take each day is 3,000 mg (6 pills a day). * IBUPROFEN (E.G., MOTRIN, ADVIL): Take 400 mg (two 200 mg pills) by mouth every 6 hours. The most you should take each day is 1,200 mg (six 200 mg pills), unless your doctor has told you to take more. COUGH SYRUP WITH DEXTROMETHORPHAN - EXTRA NOTES AND WARNINGS: * Do not try to completely stop coughs that produce mucus and  phlegm. * RESEARCH: Some research studies show that dextromethorphan reduces the frequency and severity of cough in those 18 years and older without significant adverse effects. Other studies suggest that dextromethorphan is no better than placebo at reducing a cough. COVID-19 - HOW TO PROTECT OTHERS - WHEN YOU ARE SICK WITH COVID-19: * STAY HOME A MINIMUM OF 5 DAYS: Home isolation is needed for at least 5 days after the symptoms started. Stay home from school or work if you are sick. Do NOT go to religious services, child care centers, shopping, or other public places. Do NOT use public transportation (e.g., bus, taxis, ride-sharing). Do NOT allow any visitors to your home. Leave the house only if you need to seek urgent medical care. * WEAR A MASK FOR 10 DAYS: Wear a well-fitted mask for 10 full days any time you are around others inside your home or in public. Do not go to places where you are unable to wear a mask. * [place] HANDS OFTEN: Wash hands often with soap and water. After coughing or sneezing are important times. If soap and water are not available, use an alcohol-based hand sanitizer with at least 60% alcohol, covering all surfaces of your hands and rubbing them together until they feel dry. Avoid touching your eyes, nose, and mouth with unwashed hands.

## 2020-12-12 NOTE — ED Provider Notes (Signed)
[facility]    CSN: [id number] Arrival date & time: 12/12/20  1214      History   Chief Complaint Chief Complaint  Patient presents with   Cough   Nasal Congestion    HPI [name] is a 60 y.o. female.  Patient tested positive at home for COVID today.  Began feeling ill yesterday with cough, sore throat, congestion, headache.  Reports chills, but no fever.  Denies shortness of breath, wheezing.   Cough  Past Medical History:  Diagnosis Date   Allergy    Anal intraepithelial neoplasia II (AIN II)    Arthritis    shoulder, feet   B12 deficiency    Essential hypertension    followed by pcp--- currently no meds,  states when she stopped sinus medication otc , bp better   High frequency hearing loss    History of abnormal Pap smear    History of uterine leiomyoma    Hx of migraine headaches    Iron deficiency anemia    Type 2 diabetes mellitus ([place])    followed by pcp    Patient Active Problem List   Diagnosis Date Noted   Gastroparesis 08/25/2020   Hair loss 08/25/2020   Epigastric pain 08/15/2020   Anxiety 02/24/2018   Dysuria 01/30/2018   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   Essential hypertension 10/11/2016   Screening mammogram, encounter for 08/30/2016   Routine general medical examination at a health care facility 08/30/2016   Encounter for screening for HIV 08/30/2016   Need for hepatitis C screening test 08/30/2016   History of herpes zoster 05/12/2015   Hyperlipidemia with target low density lipoprotein (LDL) cholesterol less than 100 mg/dL 12/21/2012   Class 2 severe obesity due to excess calories with serious comorbidity and body mass index (BMI) of 35.0 to 35.9 in adult ([place]) 12/21/2012   Vaginal atrophy 12/18/2012   Dyspepsia 08/20/2012   Rectal bleeding 12/04/2011   Lower back pain 12/04/2011   ELEVATED BLOOD PRESSURE 06/04/2010   ANEMIA, B12 DEFICIENCY 10/05/2008   THROMBOCYTOPENIA 10/05/2008    Allergic rhinitis 10/05/2008   Chronic headache 10/05/2008   FREQUENCY, URINARY 10/05/2008   Type 2 diabetes mellitus without complications ([place]) [id number]   ANEMIA, IRON DEFICIENCY 01/26/2008    Past Surgical History:  Procedure Laterality Date   BUNIONECTOMY Bilateral 1982   CARDIOVASCULAR STRESS TEST  09-19-2010   @ [place]   normal nuclear study w/ no evidence ishemia/  normal LV funciton and wall motion , ef 81%   COLONOSCOPY WITH ESOPHAGOGASTRODUODENOSCOPY (EGD)  last one 08-21-2017   HIGH RESOLUTION ANOSCOPY N/A 10/09/2017   Procedure: HIGH RESOLUTION ANOSCOPY,  BIOPSY  EXCISION;  Surgeon: Leighton Ruff, MD;  Location: [facility];  Service: General;  Laterality: N/A;   LAPAROSCOPIC ASSISTED VAGINAL HYSTERECTOMY  09-18-2005    dr Kennon Rounds  [place]  09-04-2001    dr [facility]   w/ lysis adhesions   NASAL ENDOSCOPY  03/2007   neg   TONSILLECTOMY  child    OB History     Gravida  0   Para  0   Term  0   Preterm  0   AB  0   Living  0      SAB  0   IAB  0   Ectopic  0   Multiple  0   Live Births  Home Medications    Prior to Admission medications   Medication Sig Start Date End Date Taking? Authorizing Provider  benzonatate (TESSALON) 100 MG capsule Take 1 capsule (100 mg total) by mouth 3 (three) times daily as needed for cough. 12/12/20  Yes Carvel Getting, NP  nirmatrelvir & ritonavir (PAXLOVID, 300/100,) 20 x 150 MG & 10 x '100MG'$  TBPK Nirmatrelvir '150mg'$  two po BID along with Ritonavir '100mg'$  one po BID 12/12/20  Yes Asta Corbridge, Dionne Bucy, NP  amLODipine (NORVASC) 5 MG tablet Take 1 tablet (5 mg total) by mouth daily. 08/25/20   Tower, Wynelle Fanny, MD  diclofenac sodium (VOLTAREN) 1 % GEL Apply 4 g topically 4 (four) times daily. 06/01/18   Hyatt, Max T, DPM  fluticasone (FLONASE) 50 MCG/ACT nasal spray SPRAY 2 SPRAYS INTO EACH NOSTRIL EVERY DAY 11/20/20   Tower, Wynelle Fanny, MD  loratadine (CLARITIN) 10 MG  tablet Take 10 mg by mouth daily.    [provider]  metFORMIN (GLUCOPHAGE) 1000 MG tablet Take 1 tablet (1,000 mg total) by mouth 2 (two) times daily with a meal. 10/31/20   Tower, Wynelle Fanny, MD  omeprazole (PRILOSEC) 40 MG capsule TAKE 1 CAPSULE (40 MG TOTAL) BY MOUTH IN THE MORNING AND AT BEDTIME. 11/25/20   Thornton Park, MD  Polyethylene Glycol 3350 (MIRALAX PO) Take 1 Dose by mouth daily.    [provider]    Family History Family History  Problem Relation Age of Onset   Heart disease Sister    Arthritis Sister    Diabetes Sister    Hypertension Sister    Diabetes Maternal Grandmother    Hypertension Maternal Grandmother    Lung cancer Father    Colon cancer Father    Sudden death Daughter        after giving birth / ? if blood clot   Breast cancer Neg Hx    Esophageal cancer Neg Hx    Stomach cancer Neg Hx    Rectal cancer Neg Hx     Social History Social History   Tobacco Use   Smoking status: Never    Passive exposure: Never   Smokeless tobacco: Never  Vaping Use   Vaping Use: Never used  Substance Use Topics   Alcohol use: Yes    Alcohol/week: 2.0 standard drinks    Types: 2 Standard drinks or equivalent per week   Drug use: No     Allergies   Lisinopril, Losartan, and Amoxicillin-pot clavulanate   Review of Systems Review of Systems  Respiratory:  Positive for cough.     Physical Exam Triage Vital Signs ED Triage Vitals [12/12/20 1232]  Enc Vitals Group     BP (!) 158/88     Pulse Rate 90     Resp 18     Temp 98.8 F (37.1 C)     Temp Source Oral     SpO2 95 %     Weight      Height      Head Circumference      Peak Flow      Pain Score 0     Pain Loc      Pain Edu?      Excl. in [place]?    No data found.  Updated Vital Signs BP (!) 158/88   Pulse 90   Temp 98.8 F (37.1 C) (Oral)   Resp 18   SpO2 95%   Visual Acuity Right Eye Distance:   Left Eye Distance:  Bilateral Distance:    Right Eye Near:   Left  Eye Near:    Bilateral Near:     Physical Exam Constitutional:      Appearance: She is ill-appearing.  HENT:     Right Ear: Tympanic membrane, ear canal and external ear normal.     Left Ear: Tympanic membrane, ear canal and external ear normal.     Nose: Congestion and rhinorrhea present.     Mouth/Throat:     Mouth: Mucous membranes are moist.     Pharynx: Oropharynx is clear.  Cardiovascular:     Rate and Rhythm: Normal rate and regular rhythm.     Heart sounds: Normal heart sounds.  Pulmonary:     Effort: Pulmonary effort is normal.     Breath sounds: Normal breath sounds.  Neurological:     Mental Status: She is alert.     UC Treatments / Results  Labs (all labs ordered are listed, but only abnormal results are displayed) Labs Reviewed - No data to display    Initial Impression / Assessment and Plan / UC Course  I have reviewed the triage vital signs and the nursing notes.  Pertinent labs & imaging results that were available during my care of the patient were reviewed by me and considered in my medical decision making (see chart for details).  Given patient's comorbidities, prescribed PACs livid for 5 days.  Patient given work note for 5 days.  Discussed supportive care therapies including use of Mucinex, Flonase, and saline nasal irrigation.  Reviewed reasons for seeking a higher level of care.   Final Clinical Impressions(s) / UC Diagnoses   Final diagnoses:  [id number]     Discharge Instructions      Please keep well-hydrated and get plenty of rest. Start a saline nasal rinse to flush out your nasal passages. You can use plain Mucinex (or generic guaifenesin) to help thin congestion. Please take prescribed medications as directed.  You were to quarantine for 5 days from onset of your symptoms.  After day 5, if you have had no fever and you are feeling better, you can end quarantine but need to mask for an additional 5 days. After day 5 if you have a fever  or are having significant symptoms, please quarantine for full 10 days.  If you note any worsening of symptoms, any significant shortness of breath or any chest pain, please seek ER evaluation ASAP.  Please do not delay care!    ED Prescriptions     Medication Sig Dispense Auth. Provider   nirmatrelvir & ritonavir (PAXLOVID, 300/100,) 20 x 150 MG & 10 x '100MG'$  TBPK Nirmatrelvir '150mg'$  two po BID along with Ritonavir '100mg'$  one po BID 30 each Elliona Doddridge, Dionne Bucy, NP   benzonatate (TESSALON) 100 MG capsule Take 1 capsule (100 mg total) by mouth 3 (three) times daily as needed for cough. 21 capsule Carvel Getting, NP      PDMP not reviewed this encounter.   Carvel Getting, NP 12/12/20 1336

## 2020-12-12 NOTE — Telephone Encounter (Signed)
Patient called in stated she tested positive for Covid . Transfer to Triage nurse

## 2020-12-22 ENCOUNTER — Other Ambulatory Visit: Payer: Self-pay | Admitting: Family Medicine

## 2021-01-18 LAB — HM DIABETES EYE EXAM

## 2021-05-03 ENCOUNTER — Other Ambulatory Visit: Payer: Self-pay | Admitting: Family Medicine

## 2021-06-23 ENCOUNTER — Other Ambulatory Visit: Payer: Self-pay | Admitting: Family Medicine

## 2021-09-27 ENCOUNTER — Other Ambulatory Visit: Payer: Self-pay | Admitting: Family Medicine

## 2021-10-05 ENCOUNTER — Other Ambulatory Visit: Payer: Self-pay | Admitting: Family Medicine

## 2021-10-05 DIAGNOSIS — Z1231 Encounter for screening mammogram for malignant neoplasm of breast: Secondary | ICD-10-CM

## 2021-10-09 ENCOUNTER — Encounter: Payer: Self-pay | Admitting: Family Medicine

## 2021-10-09 ENCOUNTER — Ambulatory Visit: Payer: BC Managed Care – PPO | Admitting: Family Medicine

## 2021-10-09 VITALS — BP 138/84 | HR 88 | Ht 63.0 in | Wt 185.6 lb

## 2021-10-09 DIAGNOSIS — N6311 Unspecified lump in the right breast, upper outer quadrant: Secondary | ICD-10-CM

## 2021-10-09 DIAGNOSIS — I10 Essential (primary) hypertension: Secondary | ICD-10-CM | POA: Diagnosis not present

## 2021-10-09 DIAGNOSIS — E119 Type 2 diabetes mellitus without complications: Secondary | ICD-10-CM | POA: Diagnosis not present

## 2021-10-09 DIAGNOSIS — E1169 Type 2 diabetes mellitus with other specified complication: Secondary | ICD-10-CM | POA: Diagnosis not present

## 2021-10-09 DIAGNOSIS — E785 Hyperlipidemia, unspecified: Secondary | ICD-10-CM | POA: Diagnosis not present

## 2021-10-09 DIAGNOSIS — J301 Allergic rhinitis due to pollen: Secondary | ICD-10-CM

## 2021-10-09 DIAGNOSIS — K3184 Gastroparesis: Secondary | ICD-10-CM

## 2021-10-09 DIAGNOSIS — N63 Unspecified lump in unspecified breast: Secondary | ICD-10-CM | POA: Insufficient documentation

## 2021-10-09 LAB — COMPREHENSIVE METABOLIC PANEL
ALT: 12 U/L (ref 0–35)
AST: 16 U/L (ref 0–37)
Albumin: 4.4 g/dL (ref 3.5–5.2)
Alkaline Phosphatase: 77 U/L (ref 39–117)
BUN: 14 mg/dL (ref 6–23)
CO2: 28 mEq/L (ref 19–32)
Calcium: 9.5 mg/dL (ref 8.4–10.5)
Chloride: 105 mEq/L (ref 96–112)
Creatinine, Ser: 0.68 mg/dL (ref 0.40–1.20)
GFR: 94.41 mL/min (ref >60.00)
Glucose, Bld: 157 mg/dL — ABNORMAL HIGH (ref 70–99)
Potassium: 4.5 mEq/L (ref 3.5–5.1)
Sodium: 141 mEq/L (ref 135–145)
Total Bilirubin: 0.3 mg/dL (ref 0.2–1.2)
Total Protein: 7 g/dL (ref 6.0–8.3)

## 2021-10-09 LAB — MICROALBUMIN / CREATININE URINE RATIO
Creatinine,U: 114.1 mg/dL
Microalb Creat Ratio: 1.5 mg/g (ref 0.0–30.0)
Microalb, Ur: 1.7 mg/dL (ref 0.0–1.9)

## 2021-10-09 LAB — LIPID PANEL
Cholesterol: 196 mg/dL (ref 0–200)
HDL: 52.4 mg/dL (ref >39.00)
LDL Cholesterol: 129 mg/dL — ABNORMAL HIGH (ref 0–99)
NonHDL: 143.14
Total CHOL/HDL Ratio: 4
Triglycerides: 72 mg/dL (ref 0.0–149.0)
VLDL: 14.4 mg/dL (ref 0.0–40.0)

## 2021-10-09 LAB — CBC WITH DIFFERENTIAL/PLATELET
Basophils Absolute: 0 10*3/uL (ref 0.0–0.1)
Basophils Relative: 0.4 % (ref 0.0–3.0)
Eosinophils Absolute: 0.1 10*3/uL (ref 0.0–0.7)
Eosinophils Relative: 1.9 % (ref 0.0–5.0)
HCT: 37.2 % (ref 36.0–46.0)
Hemoglobin: 12 g/dL (ref 12.0–15.0)
Lymphocytes Relative: 38.1 % (ref 12.0–46.0)
Lymphs Abs: 1.4 10*3/uL (ref 0.7–4.0)
MCHC: 32.2 g/dL (ref 30.0–36.0)
MCV: 81 fl (ref 78.0–100.0)
Monocytes Absolute: 0.4 10*3/uL (ref 0.1–1.0)
Monocytes Relative: 10.6 % (ref 3.0–12.0)
Neutro Abs: 1.8 10*3/uL (ref 1.4–7.7)
Neutrophils Relative %: 49 % (ref 43.0–77.0)
Platelets: 144 10*3/uL — ABNORMAL LOW (ref 150.0–400.0)
RBC: 4.6 Mil/uL (ref 3.87–5.11)
RDW: 15.6 % — ABNORMAL HIGH (ref 11.5–15.5)
WBC: 3.6 10*3/uL — ABNORMAL LOW (ref 4.0–10.5)

## 2021-10-09 LAB — HEMOGLOBIN A1C: Hgb A1c MFr Bld: 8.7 % — ABNORMAL HIGH (ref 4.6–6.5)

## 2021-10-09 LAB — TSH: TSH: 1.43 u[IU]/mL (ref 0.35–5.50)

## 2021-10-09 MED ORDER — IPRATROPIUM BROMIDE 0.06 % NA SOLN: 2.0000 | Freq: Three times a day (TID) | NASAL | 11 refills | Status: DC

## 2021-10-09 NOTE — Assessment & Plan Note (Signed)
Still congested with flonase   Px atrovent ns to try

## 2021-10-09 NOTE — Assessment & Plan Note (Signed)
Disc goals for lipids and reasons to control them Rev last labs with pt Rev low sat fat diet in detail  Not on a statin currently

## 2021-10-09 NOTE — Patient Instructions (Addendum)
Get a diabetic eye exam yearly   Labs today   Call the [facility] breast center to set up your mammogram and ultrasound   Please call the location of your choice from the menu below to schedule your Mammogram and/or Bone Density appointment.    [facility]                      Phone:  [phone], [place] [address]                                                             Services: Traditional and 3D Mammogram, [place] [facility]                 Phone: [phone] [address], [place] [address]    Service: Bone Density ONLY   *this site does NOT perform mammograms  [facility]                        Phone:  [phone] [address], [place] [address]                                            Services:  3D Mammogram and [place] at [facility]   Phone:  [phone]   [place], [place] [address]                                            Services: 3D Mammogram and [place]  [place] at [facility])  Phone:  [phone]   [address]  [place], [place] [address]                                              Services:  3D Mammogram and Bone Density  

## 2021-10-09 NOTE — Assessment & Plan Note (Signed)
Seeing GI Still intolerant of many foods and with low appetite  Continues to loose weight

## 2021-10-09 NOTE — Assessment & Plan Note (Signed)
bp in fair control at this time  BP Readings from Last 1 Encounters:  10/09/21 138/84   No changes needed Most recent labs reviewed  Disc lifstyle change with low sodium diet and exercise  Taking amlodipine 5 mg daily  Labs ordered

## 2021-10-09 NOTE — Assessment & Plan Note (Signed)
a1c ordered  Glucose readings are fairly stable at home  Limited diet /does eat low glycemic microalb ordered Sent for last eye exam report

## 2021-10-09 NOTE — Progress Notes (Signed)
Subjective:    Patient ID: [name], female    DOB: [birth year], 61 y.o.   MRN: [id number]  HPI Pt presents for f/u of DM and HTN and chronic health problems and breast cancer screening   Wt Readings from Last 3 Encounters:  10/09/21 185 lb 9.6 oz (84.2 kg)  10/03/20 193 lb (87.5 kg)  09/29/20 191 lb (86.6 kg)   32.88 kg/m  Weight is down 8 lb   (200 lb last year, kept wt up)  Gets full fast / a lot of things she eats do not taste good  Glucose is stable  Tired Not eating well since a GI problem  HTN bp is stable today  No cp or palpitations or headaches or edema  No side effects to medicines  BP Readings from Last 3 Encounters:  10/09/21 138/84  12/12/20 (!) 158/88  10/03/20 129/79    Amlodipine 5 mg daily  Cannot take ace or arb  Lab Results  Component Value Date   CREATININE 0.73 08/25/2020   BUN 13 08/25/2020   NA 140 08/25/2020   K 4.2 08/25/2020   CL 104 08/25/2020   CO2 27 08/25/2020   Due for labs   DM2 Lab Results  Component Value Date   HGBA1C 9.1 (A) 08/25/2020   Metformin 500 mg bid -went up to 1000 bid  Glucose is usually 140s-160s fasting  Occ later in the day  -does not remember to  Usually 160s later in the day   When she does eat- does stick to low glycemic  Too tired to get much exercise  Not exercise   Works 10 hours 5 days per week    Microalb ordered Eye exam: last December   Hyperlipidemia Lab Results  Component Value Date   CHOL 186 08/25/2020   HDL 42 (L) 08/25/2020   [place] 122 (H) 08/25/2020   TRIG 109 08/25/2020   CHOLHDL 4.4 08/25/2020  Not on statin   Breast cancer screening  Has a bump in her R breast -needs a dx mammogram   Some sinus congestion  Uses flonase once daily   Patient Active Problem List   Diagnosis Date Noted   Lump, breast 10/09/2021   Gastroparesis 08/25/2020   Hair loss 08/25/2020   Epigastric pain 08/15/2020   Anxiety 02/24/2018   Dysuria 01/30/2018   AIN (anal  intraepithelial neoplasia) anal canal 12/20/2017   Essential hypertension 10/11/2016   Screening mammogram, encounter for 08/30/2016   Routine general medical examination at a health care facility 08/30/2016   Encounter for screening for HIV 08/30/2016   Need for hepatitis C screening test 08/30/2016   History of herpes zoster 05/12/2015   Hyperlipidemia associated with type 2 diabetes mellitus ([place]) 12/21/2012   Class 2 severe obesity due to excess calories with serious comorbidity and body mass index (BMI) of 35.0 to 35.9 in adult ([place]) 12/21/2012   Vaginal atrophy 12/18/2012   Dyspepsia 08/20/2012   Rectal bleeding 12/04/2011   Lower back pain 12/04/2011   ELEVATED BLOOD PRESSURE 06/04/2010   ANEMIA, B12 DEFICIENCY 10/05/2008   THROMBOCYTOPENIA 10/05/2008   Allergic rhinitis 10/05/2008   Chronic headache 10/05/2008   FREQUENCY, URINARY 10/05/2008   Type 2 diabetes mellitus without complications ([place]) 35/70/1779   ANEMIA, IRON DEFICIENCY 01/26/2008   Past Medical History:  Diagnosis Date   Allergy    Anal intraepithelial neoplasia II (AIN II)    Arthritis    shoulder, feet   B12 deficiency  Essential hypertension    followed by pcp--- currently no meds,  states when she stopped sinus medication otc , bp better   High frequency hearing loss    History of abnormal Pap smear    History of uterine leiomyoma    Hx of migraine headaches    Iron deficiency anemia    Type 2 diabetes mellitus ([place])    followed by pcp   Past Surgical History:  Procedure Laterality Date   BUNIONECTOMY Bilateral 1982   CARDIOVASCULAR STRESS TEST  09-19-2010   @ [place]   normal nuclear study w/ no evidence ishemia/  normal LV funciton and wall motion , ef 81%   COLONOSCOPY WITH ESOPHAGOGASTRODUODENOSCOPY (EGD)  last one 08-21-2017   HIGH RESOLUTION ANOSCOPY N/A 10/09/2017   Procedure: HIGH RESOLUTION ANOSCOPY,  BIOPSY  EXCISION;  Surgeon: Leighton Ruff, MD;  Location: [facility];   Service: General;  Laterality: N/A;   LAPAROSCOPIC ASSISTED VAGINAL HYSTERECTOMY  09-18-2005    dr Kennon Rounds  [place]  09-04-2001    dr [facility]   w/ lysis adhesions   NASAL ENDOSCOPY  03/2007   neg   TONSILLECTOMY  child   Social History   Tobacco Use   Smoking status: Never    Passive exposure: Never   Smokeless tobacco: Never  Vaping Use   Vaping Use: Never used  Substance Use Topics   Alcohol use: Yes    Alcohol/week: 2.0 standard drinks of alcohol    Types: 2 Standard drinks or equivalent per week   Drug use: No   Family History  Problem Relation Age of Onset   Heart disease Sister    Arthritis Sister    Diabetes Sister    Hypertension Sister    Diabetes Maternal Grandmother    Hypertension Maternal Grandmother    Lung cancer Father    Colon cancer Father    Sudden death Daughter        after giving birth / ? if blood clot   Breast cancer Neg Hx    Esophageal cancer Neg Hx    Stomach cancer Neg Hx    Rectal cancer Neg Hx    Allergies  Allergen Reactions   Lisinopril Cough   Losartan     Chest pressure    Amoxicillin-Pot Clavulanate Rash    REACTION: Itchy rash   Current Outpatient Medications on File Prior to Visit  Medication Sig Dispense Refill   amLODipine (NORVASC) 5 MG tablet TAKE 1 TABLET (5 MG TOTAL) BY MOUTH DAILY. 90 tablet 0   fluticasone (FLONASE) 50 MCG/ACT nasal spray SPRAY 2 SPRAYS INTO EACH NOSTRIL EVERY DAY 48 mL 2   loratadine (CLARITIN) 10 MG tablet Take 10 mg by mouth daily.     metFORMIN (GLUCOPHAGE) 1000 MG tablet TAKE 1 TABLET (1,000 MG TOTAL) BY MOUTH TWICE A DAY WITH FOOD 180 tablet 0   Polyethylene Glycol 3350 (MIRALAX PO) Take 1 Dose by mouth daily.     diclofenac sodium (VOLTAREN) 1 % GEL Apply 4 g topically 4 (four) times daily. (Patient not taking: Reported on 10/09/2021) 100 g 2   nirmatrelvir & ritonavir (PAXLOVID, 300/100,) 20 x 150 MG & 10 x '100MG'$  TBPK Nirmatrelvir '150mg'$  two po BID along with  Ritonavir '100mg'$  one po BID (Patient not taking: Reported on 10/09/2021) 30 each 0   omeprazole (PRILOSEC) 40 MG capsule TAKE 1 CAPSULE (40 MG TOTAL) BY MOUTH IN THE MORNING AND  AT BEDTIME. (Patient not taking: Reported on 10/09/2021) 60 capsule 3   No current facility-administered medications on file prior to visit.     Review of Systems  Constitutional:  Positive for fever. Negative for activity change, appetite change, fatigue and unexpected weight change.  HENT:  Negative for congestion, ear pain, rhinorrhea, sinus pressure and sore throat.   Eyes:  Negative for pain, redness and visual disturbance.  Respiratory:  Negative for cough, shortness of breath and wheezing.   Cardiovascular:  Negative for chest pain and palpitations.  Gastrointestinal:  Positive for abdominal distention and nausea. Negative for abdominal pain, blood in stool, constipation and diarrhea.  Endocrine: Negative for polydipsia and polyuria.  Genitourinary:  Negative for dysuria, frequency and urgency.       Breast lump  Musculoskeletal:  Negative for arthralgias, back pain and myalgias.  Skin:  Negative for pallor and rash.  Allergic/Immunologic: Negative for environmental allergies.  Neurological:  Negative for dizziness, syncope and headaches.  Hematological:  Negative for adenopathy. Does not bruise/bleed easily.  Psychiatric/Behavioral:  Negative for decreased concentration and dysphoric mood. The patient is not nervous/anxious.        Objective:   Physical Exam Constitutional:      General: She is not in acute distress.    Appearance: Normal appearance. She is well-developed. She is obese. She is not ill-appearing or diaphoretic.  HENT:     Head: Normocephalic and atraumatic.  Eyes:     Conjunctiva/sclera: Conjunctivae normal.     Pupils: Pupils are equal, round, and reactive to light.  Neck:     Thyroid: No thyromegaly.     Vascular: No carotid bruit or JVD.  Cardiovascular:     Rate and Rhythm:  Normal rate and regular rhythm.     Heart sounds: Normal heart sounds.     No gallop.  Pulmonary:     Effort: Pulmonary effort is normal. No respiratory distress.     Breath sounds: Normal breath sounds. No wheezing or rales.  Abdominal:     General: There is no distension or abdominal bruit.     Palpations: Abdomen is soft.  Musculoskeletal:     Cervical back: Normal range of motion and neck supple.     Right lower leg: No edema.     Left lower leg: No edema.  Lymphadenopathy:     Cervical: No cervical adenopathy.  Skin:    General: Skin is warm and dry.     Coloration: Skin is not pale.     Findings: No rash.  Neurological:     Mental Status: She is alert.     Coordination: Coordination normal.     Deep Tendon Reflexes: Reflexes are normal and symmetric. Reflexes normal.  Psychiatric:        Mood and Affect: Mood normal.     Comments: Pt seems fatigued            Assessment & Plan:   Problem List Items Addressed This Visit       Cardiovascular and Mediastinum   Essential hypertension    bp in fair control at this time  BP Readings from Last 1 Encounters:  10/09/21 138/84  No changes needed Most recent labs reviewed  Disc lifstyle change with low sodium diet and exercise  Taking amlodipine 5 mg daily  Labs ordered       Relevant Orders   TSH (Completed)   Lipid panel (Completed)   Comprehensive metabolic panel (Completed)   CBC with Differential/Platelet (  Completed)     Respiratory   Allergic rhinitis    Still congested with flonase   Px atrovent ns to try         Digestive   Gastroparesis    Seeing GI Still intolerant of many foods and with low appetite  Continues to loose weight         Endocrine   Hyperlipidemia associated with type 2 diabetes mellitus ([place])    Disc goals for lipids and reasons to control them Rev last labs with pt Rev low sat fat diet in detail  Not on a statin currently        Relevant Orders   Lipid panel  (Completed)   Type 2 diabetes mellitus without complications ([place])    O8I ordered  Glucose readings are fairly stable at home  Limited diet /does eat low glycemic microalb ordered Sent for last eye exam report       Relevant Orders   Microalbumin/Creatinine Ratio, Urine (Completed)   Hemoglobin A1c (Completed)     Other   Lump, breast - Primary    R breast lump at 10:00 Diagnostic mm and [place] ordered  Last breast imaging was 2018         Relevant Orders   MM DIAG BREAST TOMO BILATERAL   US BREAST LTD UNI RIGHT INC AXILLA

## 2021-10-09 NOTE — Assessment & Plan Note (Signed)
R breast lump at 10:00 Diagnostic mm and [place] ordered  Last breast imaging was 2018

## 2021-10-24 ENCOUNTER — Ambulatory Visit
Admission: RE | Admit: 2021-10-24 | Discharge: 2021-10-24 | Disposition: A | Discharge status: Home / Self Care | Payer: BC Managed Care – PPO | Source: Ambulatory Visit | Attending: Family Medicine | Admitting: Family Medicine

## 2021-10-24 DIAGNOSIS — N6311 Unspecified lump in the right breast, upper outer quadrant: Secondary | ICD-10-CM

## 2021-10-25 ENCOUNTER — Other Ambulatory Visit: Payer: Self-pay | Admitting: Family Medicine

## 2021-10-25 DIAGNOSIS — N63 Unspecified lump in unspecified breast: Secondary | ICD-10-CM

## 2021-10-25 DIAGNOSIS — R928 Other abnormal and inconclusive findings on diagnostic imaging of breast: Secondary | ICD-10-CM

## 2021-10-26 ENCOUNTER — Ambulatory Visit: Payer: No Typology Code available for payment source | Admitting: Family Medicine

## 2021-11-09 ENCOUNTER — Ambulatory Visit
Admission: RE | Admit: 2021-11-09 | Discharge: 2021-11-09 | Disposition: A | Discharge status: Home / Self Care | Payer: BC Managed Care – PPO | Source: Ambulatory Visit | Attending: Family Medicine | Admitting: Family Medicine

## 2021-11-09 DIAGNOSIS — R928 Other abnormal and inconclusive findings on diagnostic imaging of breast: Secondary | ICD-10-CM | POA: Diagnosis not present

## 2021-11-09 DIAGNOSIS — C50411 Malignant neoplasm of upper-outer quadrant of right female breast: Secondary | ICD-10-CM | POA: Diagnosis not present

## 2021-11-09 DIAGNOSIS — N6311 Unspecified lump in the right breast, upper outer quadrant: Secondary | ICD-10-CM | POA: Diagnosis not present

## 2021-11-09 DIAGNOSIS — N63 Unspecified lump in unspecified breast: Secondary | ICD-10-CM

## 2021-11-12 ENCOUNTER — Encounter: Payer: Self-pay | Admitting: *Deleted

## 2021-11-12 DIAGNOSIS — C50919 Malignant neoplasm of unspecified site of unspecified female breast: Secondary | ICD-10-CM

## 2021-11-12 NOTE — Progress Notes (Signed)
Received referral for newly diagnosed breast cancer from [facility].  Navigation initiated.  Patient requested to see a surgeon at [facility], message sent to Catalina Lunger about referral.   Med onc is scheduled with Dr. Tasia Catchings on 8/21 at 10:45

## 2021-11-13 LAB — SURGICAL PATHOLOGY

## 2021-11-19 ENCOUNTER — Inpatient Hospital Stay: Payer: BC Managed Care – PPO

## 2021-11-19 ENCOUNTER — Inpatient Hospital Stay: Payer: BC Managed Care – PPO | Attending: Oncology | Admitting: Oncology

## 2021-11-19 ENCOUNTER — Other Ambulatory Visit: Payer: Self-pay | Admitting: Surgery

## 2021-11-19 ENCOUNTER — Telehealth: Payer: Self-pay | Admitting: Family Medicine

## 2021-11-19 ENCOUNTER — Encounter: Payer: Self-pay | Admitting: Oncology

## 2021-11-19 ENCOUNTER — Encounter: Payer: Self-pay | Admitting: *Deleted

## 2021-11-19 VITALS — BP 148/91 | HR 72 | Temp 97.5°F | Resp 16 | Ht 62.0 in | Wt 185.6 lb

## 2021-11-19 DIAGNOSIS — Z90710 Acquired absence of both cervix and uterus: Secondary | ICD-10-CM | POA: Insufficient documentation

## 2021-11-19 DIAGNOSIS — Z7189 Other specified counseling: Secondary | ICD-10-CM | POA: Insufficient documentation

## 2021-11-19 DIAGNOSIS — F1090 Alcohol use, unspecified, uncomplicated: Secondary | ICD-10-CM | POA: Insufficient documentation

## 2021-11-19 DIAGNOSIS — Z170 Estrogen receptor positive status [ER+]: Secondary | ICD-10-CM | POA: Insufficient documentation

## 2021-11-19 DIAGNOSIS — Z800 Family history of malignant neoplasm of digestive organs: Secondary | ICD-10-CM | POA: Diagnosis not present

## 2021-11-19 DIAGNOSIS — C50411 Malignant neoplasm of upper-outer quadrant of right female breast: Secondary | ICD-10-CM | POA: Diagnosis not present

## 2021-11-19 DIAGNOSIS — C50919 Malignant neoplasm of unspecified site of unspecified female breast: Secondary | ICD-10-CM

## 2021-11-19 DIAGNOSIS — C50911 Malignant neoplasm of unspecified site of right female breast: Secondary | ICD-10-CM | POA: Diagnosis not present

## 2021-11-19 LAB — COMPREHENSIVE METABOLIC PANEL WITH GFR
ALT: 15 U/L (ref 0–44)
AST: 23 U/L (ref 15–41)
Albumin: 4 g/dL (ref 3.5–5.0)
Alkaline Phosphatase: 73 U/L (ref 38–126)
Anion gap: 9 (ref 5–15)
BUN: 14 mg/dL (ref 6–20)
CO2: 23 mmol/L (ref 22–32)
Calcium: 9.1 mg/dL (ref 8.9–10.3)
Chloride: 105 mmol/L (ref 98–111)
Creatinine, Ser: 0.69 mg/dL (ref 0.44–1.00)
GFR, Estimated: >60 mL/min
Glucose, Bld: 122 mg/dL — ABNORMAL HIGH (ref 70–99)
Potassium: 3.9 mmol/L (ref 3.5–5.1)
Sodium: 137 mmol/L (ref 135–145)
Total Bilirubin: 0.5 mg/dL (ref 0.3–1.2)
Total Protein: 7.5 g/dL (ref 6.5–8.1)

## 2021-11-19 LAB — CBC WITH DIFFERENTIAL/PLATELET
Abs Immature Granulocytes: 0.01 K/uL (ref 0.00–0.07)
Basophils Absolute: 0 K/uL (ref 0.0–0.1)
Basophils Relative: 1 %
Eosinophils Absolute: 0.1 K/uL (ref 0.0–0.5)
Eosinophils Relative: 2 %
HCT: 37.7 % (ref 36.0–46.0)
Hemoglobin: 12.3 g/dL (ref 12.0–15.0)
Immature Granulocytes: 0 %
Lymphocytes Relative: 42 %
Lymphs Abs: 1.7 K/uL (ref 0.7–4.0)
MCH: 26.4 pg (ref 26.0–34.0)
MCHC: 32.6 g/dL (ref 30.0–36.0)
MCV: 80.9 fL (ref 80.0–100.0)
Monocytes Absolute: 0.4 K/uL (ref 0.1–1.0)
Monocytes Relative: 9 %
Neutro Abs: 1.8 K/uL (ref 1.7–7.7)
Neutrophils Relative %: 46 %
Platelets: 150 K/uL (ref 150–400)
RBC: 4.66 MIL/uL (ref 3.87–5.11)
RDW: 14.9 % (ref 11.5–15.5)
WBC: 4 K/uL (ref 4.0–10.5)
nRBC: 0 % (ref 0.0–0.2)

## 2021-11-19 NOTE — Progress Notes (Signed)
Hematology/Oncology Consult Note [phone] [phone]     REFERRING PROVIDER: Abner Greenspan, MD   Patient Care Team: Tower, Wynelle Fanny, MD as PCP - General  ASSESSMENT & PLAN:   Cancer Staging  Breast cancer in female [facility]) Staging form: Breast, AJCC 8th Edition - Clinical stage from 11/12/2021: Stage IA (cT1, cN0, cM0, G2, ER+, PR+, HER2-) - Signed by Earlie Server, MD on 11/19/2021   Breast cancer in female [facility]) Images and pathology reports were reviewed and discussed with patient. Discussed with patient about her diagnosis of right breast cancer, ER/PR positive, HER2 negative. Recommend upfront surgical resection-lumpectomy with sentinel lymph node biopsy Patient will further discuss details of surgery with surgeon during her upcoming appointment with Dr. Ninfa Linden We discussed about Oncotype DX testing on the final specimen to determine her benefit of chemotherapy. Eventually patient will need adjuvant radiation followed by adjuvant endocrine therapy. Check CBC, CMP, CA 2729, CA 15-3 History of hysterectomy, patient is 61 years of age, likely postmenopausal.  Check FSH and estradiol  Goals of care, counseling/discussion Discussed with patient.   Orders Placed This Encounter  Procedures   CBC with Differential/Platelet    Standing Status:   Future    Number of Occurrences:   1    Standing Expiration Date:   11/20/2022   Comprehensive metabolic panel    Standing Status:   Future    Number of Occurrences:   1    Standing Expiration Date:   5/99/3570   Follicle stimulating hormone    Standing Status:   Future    Number of Occurrences:   1    Standing Expiration Date:   11/19/2022   Estradiol    Standing Status:   Future    Number of Occurrences:   1    Standing Expiration Date:   11/19/2022   Cancer antigen 27.29    Standing Status:   Future    Number of Occurrences:   1    Standing Expiration Date:   11/20/2022   Cancer antigen 15-3    Standing  Status:   Future    Number of Occurrences:   1    Standing Expiration Date:   11/20/2022   Follow-up to be determined. I plan to see patient 2 to 3 weeks after her breast surgery to review adjuvant plans.   All questions were answered. The patient knows to call the clinic with any problems, questions or concerns. No barriers to learning was detected.  Earlie Server, MD 11/19/2021   CHIEF COMPLAINTS/PURPOSE OF CONSULTATION:  Right breast cancer  HISTORY OF PRESENTING ILLNESS:  [name] 61 y.o. female presents to establish care for right breast cancer I have reviewed her chart and materials related to her cancer extensively and collaborated history with the patient. Summary of oncologic history is as follows: Oncology History  Breast cancer in female [facility])  10/24/2021 Mammogram   Bilateral diagnostic mammogram and targeted ultrasound right breast showed 1. Suspicious right breast mass corresponding with the patient's palpable lump. Recommendation is for ultrasound-guided biopsy. 2. No suspicious right axillary lymphadenopathy. 3. No mammographic evidence of malignancy on the left    11/12/2021 Cancer Staging   Staging form: Breast, AJCC 8th Edition - Clinical stage from 11/12/2021: Stage IA (cT1, cN0, cM0, G2, ER+, PR+, HER2-) - Signed by Earlie Server, MD on 11/19/2021 Stage prefix: Initial diagnosis Histologic grading system: 3 grade system   11/19/2021 Initial Diagnosis   Breast cancer in female [facility]  -Patient  self palpated right breast lump for about 3 months. -11/09/2021, right breast 11:00, 4 cm from nipple, ultrasound-guided biopsy showed invasive mammary carcinoma, ductal NOS.  Grade 2, ER +90%, PR + 90%, HER2 negative [score 1+)    Procedure     Menarche 12-64 years of age OCP use for about 20 years till 55s. Denies any history of hormone replacement therapy. Denies family history of breast cancer Denies previous chest radiation. Patient denies any previous breast  biopsy History of hysterectomy.  Patient was accompanied by husband today.  MEDICAL HISTORY:  Past Medical History:  Diagnosis Date   Allergy    Anal intraepithelial neoplasia II (AIN II)    Arthritis    shoulder, feet   B12 deficiency    Essential hypertension    followed by pcp--- currently no meds,  states when she stopped sinus medication otc , bp better   High frequency hearing loss    History of abnormal Pap smear    History of uterine leiomyoma    Hx of migraine headaches    Iron deficiency anemia    Type 2 diabetes mellitus ([place])    followed by pcp    SURGICAL HISTORY: Past Surgical History:  Procedure Laterality Date   BUNIONECTOMY Bilateral 1982   CARDIOVASCULAR STRESS TEST  09-19-2010   @ [place]   normal nuclear study w/ no evidence ishemia/  normal LV funciton and wall motion , ef 81%   COLONOSCOPY WITH ESOPHAGOGASTRODUODENOSCOPY (EGD)  last one 08-21-2017   HIGH RESOLUTION ANOSCOPY N/A 10/09/2017   Procedure: HIGH RESOLUTION ANOSCOPY,  BIOPSY  EXCISION;  Surgeon: Leighton Ruff, MD;  Location: [facility];  Service: General;  Laterality: N/A;   LAPAROSCOPIC ASSISTED VAGINAL HYSTERECTOMY  09-18-2005    dr Kennon Rounds  [place]  09-04-2001    dr [facility]is [facility]   w/ lysis adhesions   NASAL ENDOSCOPY  03/2007   neg   TONSILLECTOMY  child    SOCIAL HISTORY: Social History   Socioeconomic History   Marital status: Single    Spouse name: Not on file   Number of children: 0   Years of education: Not on file   Highest education level: Not on file  Occupational History   Not on file  Tobacco Use   Smoking status: Never    Passive exposure: Never   Smokeless tobacco: Never  Vaping Use   Vaping Use: Never used  Substance and Sexual Activity   Alcohol use: Yes    Alcohol/week: 2.0 standard drinks of alcohol    Types: 2 Standard drinks or equivalent per week   Drug use: No   Sexual activity: Yes    Partners: Male     Birth control/protection: Surgical    Comment: LAVH  Other Topics Concern   Not on file  Social History Narrative   Not on file   Social Determinants of Health   Financial Resource Strain: Not on file  Food Insecurity: Not on file  Transportation Needs: Not on file  Physical Activity: Not on file  Stress: Not on file  Social Connections: Not on file  Intimate Partner Violence: Not on file    FAMILY HISTORY: Family History  Problem Relation Age of Onset   Rectal cancer Father    Colon cancer Father    Heart disease Sister    Arthritis Sister    Diabetes Sister    Hypertension Sister    Diabetes Maternal  Grandmother    Hypertension Maternal Grandmother    Sudden death Daughter        after giving birth / ? if blood clot   Breast cancer Neg Hx    Esophageal cancer Neg Hx    Stomach cancer Neg Hx     ALLERGIES:  is allergic to lisinopril, losartan, and amoxicillin-pot clavulanate.  MEDICATIONS:  Current Outpatient Medications  Medication Sig Dispense Refill   amLODipine (NORVASC) 5 MG tablet TAKE 1 TABLET (5 MG TOTAL) BY MOUTH DAILY. 90 tablet 0   loratadine (CLARITIN) 10 MG tablet Take 10 mg by mouth daily.     metFORMIN (GLUCOPHAGE) 1000 MG tablet TAKE 1 TABLET (1,000 MG TOTAL) BY MOUTH TWICE A DAY WITH FOOD 180 tablet 0   diclofenac sodium (VOLTAREN) 1 % GEL Apply 4 g topically 4 (four) times daily. (Patient not taking: Reported on 10/09/2021) 100 g 2   Polyethylene Glycol 3350 (MIRALAX PO) Take 1 Dose by mouth daily. (Patient not taking: Reported on 11/19/2021)     No current facility-administered medications for this visit.    Review of Systems  Constitutional:  Negative for appetite change, chills, fatigue and fever.  HENT:   Negative for hearing loss and voice change.   Eyes:  Negative for eye problems.  Respiratory:  Negative for chest tightness and cough.   Cardiovascular:  Negative for chest pain.  Gastrointestinal:  Negative for abdominal distention,  abdominal pain and blood in stool.  Endocrine: Negative for hot flashes.  Genitourinary:  Negative for difficulty urinating and frequency.   Musculoskeletal:  Negative for arthralgias.  Skin:  Negative for itching and rash.  Neurological:  Negative for extremity weakness.  Hematological:  Negative for adenopathy.  Psychiatric/Behavioral:  Negative for confusion.    Right breast mass.  PHYSICAL EXAMINATION: ECOG PERFORMANCE STATUS: 0 - Asymptomatic  Vitals:   11/19/21 1057  BP: (!) 148/91  Pulse: 72  Resp: 16  Temp: (!) 97.5 F (36.4 C)  SpO2: 97%   Filed Weights   11/19/21 1057  Weight: 185 lb 9.6 oz (84.2 kg)    Physical Exam Constitutional:      General: She is not in acute distress.    Appearance: She is not diaphoretic.  HENT:     Head: Normocephalic and atraumatic.     Nose: Nose normal.     Mouth/Throat:     Pharynx: No oropharyngeal exudate.  Eyes:     General: No scleral icterus.    Pupils: Pupils are equal, round, and reactive to light.  Cardiovascular:     Rate and Rhythm: Normal rate and regular rhythm.     Heart sounds: No murmur heard. Pulmonary:     Effort: Pulmonary effort is normal. No respiratory distress.     Breath sounds: No rales.  Chest:     Chest wall: No tenderness.  Abdominal:     General: There is no distension.     Palpations: Abdomen is soft.     Tenderness: There is no abdominal tenderness.  Musculoskeletal:        General: Normal range of motion.     Cervical back: Normal range of motion and neck supple.  Skin:    General: Skin is warm and dry.     Findings: No erythema.  Neurological:     Mental Status: She is alert and oriented to person, place, and time.     Cranial Nerves: No cranial nerve deficit.     Motor: No abnormal muscle  tone.     Coordination: Coordination normal.  Psychiatric:        Mood and Affect: Affect normal.    Breast exam was performed in seated and lying down position. Patient is status post right  breast biopsy with focal bruising and tenderness at the site of biopsy.   Palpable right upper quadrant breast mass 2 cm. No palpable mass in the left breast.  No palpable axillary lymphadenopathy.    LABORATORY DATA:  I have reviewed the data as listed Lab Results  Component Value Date   WBC 4.0 11/19/2021   HGB 12.3 11/19/2021   HCT 37.7 11/19/2021   MCV 80.9 11/19/2021   PLT 150 11/19/2021   Recent Labs    10/09/21 0919 11/19/21 1157  NA 141 137  K 4.5 3.9  CL 105 105  CO2 28 23  GLUCOSE 157* 122*  BUN 14 14  CREATININE 0.68 0.69  CALCIUM 9.5 9.1  GFRNONAA  --  >60  PROT 7.0 7.5  ALBUMIN 4.4 4.0  AST 16 23  ALT 12 15  ALKPHOS 77 73  BILITOT 0.3 0.5    RADIOGRAPHIC STUDIES: I have personally reviewed the radiological images as listed and agreed with the findings in the report. [place] RT BREAST BX W LOC DEV 1ST LESION IMG BX SPEC US GUIDE  Addendum Date: 11/12/2021   ADDENDUM REPORT: 11/12/2021 13:18 ADDENDUM: PATHOLOGY revealed: A. Breast, right, 11:00, 4 cm from nipple; ultrasound-guided biopsy: - INVASIVE MAMMARY CARCINOMA, ductal NOS. 16 mm in this sample. Grade 2. Ductal carcinoma in situ: Not identified. Lymphovascular invasion: Not identified. Pathology results are CONCORDANT with imaging findings, per Dr. Marin Olp. Pathology results and recommendations were discussed with patient via telephone on 11/12/2021. Patient reported biopsy site doing well with no adverse symptoms, and only slight tenderness at the site. Post biopsy care instructions were reviewed, questions were answered and my direct phone number was provided. Patient was instructed to call [facility] for any additional questions or concerns related to biopsy site. RECOMMENDATIONS: Surgical consultation. Request for surgical consultation relayed to Casper Harrison RN at [facility] by Electa Sniff RN on 11/12/2021. Pathology results reported by Electa Sniff RN on 11/12/2021.  Electronically Signed   By: Marin Olp M.D.   On: 11/12/2021 13:18   Result Date: 11/12/2021 CLINICAL DATA:  Patient presents for ultrasound-guided core needle biopsy of a 2 cm suspicious mass over the 11 o'clock position of the right breast 4 cm from the nipple. EXAM: ULTRASOUND GUIDED RIGHT BREAST CORE NEEDLE BIOPSY COMPARISON:  Previous exam(s). PROCEDURE: I met with the patient and we discussed the procedure of ultrasound-guided biopsy, including benefits and alternatives. We discussed the high likelihood of a successful procedure. We discussed the risks of the procedure, including infection, bleeding, tissue injury, clip migration, and inadequate sampling. Informed written consent was given. The usual time-out protocol was performed immediately prior to the procedure. Lesion quadrant: Right upper outer quadrant. Using sterile technique and 1% Lidocaine as local anesthetic, under direct ultrasound visualization, a 12 gauge spring-loaded device was used to perform biopsy of the targeted mass at the 11 o'clock position using a inferior to superior approach. At the conclusion of the procedure a heart shaped tissue marker clip was deployed into the biopsy cavity. Follow up 2 view mammogram was performed and dictated separately. IMPRESSION: Ultrasound guided biopsy of a suspicious right breast mass. No apparent complications. Electronically Signed: By: Marin Olp M.D. On: 11/09/2021 09:35   MM  CLIP PLACEMENT RIGHT  Result Date: 11/09/2021 CLINICAL DATA:  Patient is post ultrasound-guided core needle biopsy of the suspicious 2 cm mass over the 11 o'clock position of the right breast. EXAM: 3D DIAGNOSTIC RIGHT MAMMOGRAM POST ULTRASOUND BIOPSY COMPARISON:  Previous exam(s). FINDINGS: 3D Mammographic images were obtained following ultrasound guided biopsy of the targeted mass over the 11 o'clock position of the right breast. The biopsy marking clip is in expected position at the site of biopsy. IMPRESSION:  Appropriate positioning of the heart shaped biopsy marking clip at the site of biopsy in the 11 o'clock position of the right breast. Final Assessment: Post Procedure Mammograms for Marker Placement Electronically Signed   By: Marin Olp M.D.   On: 11/09/2021 09:52  MM DIAG BREAST TOMO BILATERAL  Result Date: 10/24/2021 CLINICAL DATA:  61 year old female with a palpable right breast lump. EXAM: DIGITAL DIAGNOSTIC BILATERAL MAMMOGRAM WITH TOMOSYNTHESIS; ULTRASOUND RIGHT BREAST LIMITED TECHNIQUE: Bilateral digital diagnostic mammography and breast tomosynthesis was performed.; Targeted ultrasound examination of the right breast was performed COMPARISON:  Previous exam(s). ACR Breast Density Category b: There are scattered areas of fibroglandular density. FINDINGS: A radiopaque BB was placed at the site of the patient's palpable lump in the upper outer right breast at mid to anterior depth. An irregular, bilobed mass with spiculated margins is seen deep to the radiopaque BB. No other suspicious findings in the remainder of either breast. Targeted ultrasound is performed, showing an irregular hypoechoic mass measuring 2.0 x 1.0 x 1.7 cm. There is associated vascularity. This correlates with the mammographic finding. Evaluation of the right axilla demonstrates no suspicious lymphadenopathy. IMPRESSION: 1. Suspicious right breast mass corresponding with the patient's palpable lump. Recommendation is for ultrasound-guided biopsy. 2. No suspicious right axillary lymphadenopathy. 3. No mammographic evidence of malignancy on the left. RECOMMENDATION: Ultrasound-guided biopsy of the right breast. I have discussed the findings and recommendations with the patient. If applicable, a reminder letter will be sent to the patient regarding the next appointment. BI-RADS CATEGORY  5: Highly suggestive of malignancy. Electronically Signed   By: Kristopher Oppenheim M.D.   On: 10/24/2021 16:07  US BREAST LTD UNI RIGHT INC  AXILLA  Result Date: 10/24/2021 CLINICAL DATA:  61 year old female with a palpable right breast lump. EXAM: DIGITAL DIAGNOSTIC BILATERAL MAMMOGRAM WITH TOMOSYNTHESIS; ULTRASOUND RIGHT BREAST LIMITED TECHNIQUE: Bilateral digital diagnostic mammography and breast tomosynthesis was performed.; Targeted ultrasound examination of the right breast was performed COMPARISON:  Previous exam(s). ACR Breast Density Category b: There are scattered areas of fibroglandular density. FINDINGS: A radiopaque BB was placed at the site of the patient's palpable lump in the upper outer right breast at mid to anterior depth. An irregular, bilobed mass with spiculated margins is seen deep to the radiopaque BB. No other suspicious findings in the remainder of either breast. Targeted ultrasound is performed, showing an irregular hypoechoic mass measuring 2.0 x 1.0 x 1.7 cm. There is associated vascularity. This correlates with the mammographic finding. Evaluation of the right axilla demonstrates no suspicious lymphadenopathy. IMPRESSION: 1. Suspicious right breast mass corresponding with the patient's palpable lump. Recommendation is for ultrasound-guided biopsy. 2. No suspicious right axillary lymphadenopathy. 3. No mammographic evidence of malignancy on the left. RECOMMENDATION: Ultrasound-guided biopsy of the right breast. I have discussed the findings and recommendations with the patient. If applicable, a reminder letter will be sent to the patient regarding the next appointment. BI-RADS CATEGORY  5: Highly suggestive of malignancy. Electronically Signed   By: Shayne Alken.D.  On: 10/24/2021 16:07

## 2021-11-19 NOTE — Progress Notes (Signed)
New patient referred by Dr Glori Bickers for breast cancer.

## 2021-11-19 NOTE — Assessment & Plan Note (Addendum)
Images and pathology reports were reviewed and discussed with patient. Discussed with patient about her diagnosis of right breast cancer, ER/PR positive, HER2 negative. Recommend upfront surgical resection-lumpectomy with sentinel lymph node biopsy Patient will further discuss details of surgery with surgeon during her upcoming appointment with Dr. Ninfa Linden We discussed about Oncotype DX testing on the final specimen to determine her benefit of chemotherapy. Eventually patient will need adjuvant radiation followed by adjuvant endocrine therapy. Check CBC, CMP, CA 2729, CA 15-3 History of hysterectomy, patient is 61 years of age, likely postmenopausal.  Check FSH and estradiol

## 2021-11-19 NOTE — Telephone Encounter (Signed)
[facility] Surgery records have been received. Thank you!

## 2021-11-19 NOTE — Telephone Encounter (Signed)
Forwarding

## 2021-11-19 NOTE — Assessment & Plan Note (Signed)
Discussed with patient

## 2021-11-19 NOTE — Progress Notes (Signed)
Met with patient and family during medical oncology visit.      Reviewed Breast Cancer treatment handbook.   Care plan summary given to patient.

## 2021-11-20 ENCOUNTER — Encounter: Payer: Self-pay | Admitting: Family Medicine

## 2021-11-20 LAB — CANCER ANTIGEN 27.29: CA 27.29: 21.3 U/mL (ref 0.0–38.6)

## 2021-11-20 LAB — ESTRADIOL: Estradiol: <5 pg/mL

## 2021-11-20 LAB — FOLLICLE STIMULATING HORMONE: FSH: 57.8 m[IU]/mL

## 2021-11-20 LAB — CANCER ANTIGEN 15-3: CA 15-3: 22 U/mL (ref 0.0–25.0)

## 2021-11-22 ENCOUNTER — Encounter (HOSPITAL_COMMUNITY): Payer: Self-pay | Admitting: Surgery

## 2021-11-22 ENCOUNTER — Other Ambulatory Visit: Payer: Self-pay

## 2021-11-22 NOTE — Pre-Procedure Instructions (Signed)
SDW CALL  Patient was given pre-op instructions over the phone. The opportunity was given for the patient to ask questions. No further questions asked. Patient verbalized understanding of instructions given.   PCP - Tower, Wynelle Fanny, MD Cardiologist - denies  PPM/ICD - denies Chest x-ray - N/A EKG - DOS Stress Test - 2012 ECHO - denies Cardiac Cath - denies  Sleep Study - denies  Fasting Blood Sugar - pt checks daily usually 140-150 per patient- Hgb A1c 8.7  Blood Thinner Instructions: N/A Aspirin Instructions: N/A  ERAS Protcol - ERAS order   COVID TEST- N/A   Anesthesia review: yes- HgbA1c 8.7  Patient denies shortness of breath, fever, cough and chest pain over the phone call    Surgical Instructions    Your procedure is scheduled on Monday August 28  Report to [facility] Main Entrance "A" at 9:45 A.M., then check in with the Admitting office.  Call this number if you have problems the morning of surgery:  [phone]    Remember:  Do not eat after midnight the night before your surgery  You may drink clear liquids until 09:15AM the morning of your surgery.   Clear liquids allowed are: Water, Non-Citrus Juices (without pulp), Carbonated Beverages, Clear Tea, Black Coffee ONLY (NO MILK, CREAM OR POWDERED CREAMER of any kind), and Gatorade   Take these medicines the morning of surgery with A SIP OF WATER: amlodipine, claritin PRN  DO NOT TAKE Metformin the day of surgery  As of today, STOP taking any Aspirin (unless otherwise instructed by your surgeon) Aleve, Naproxen, Ibuprofen, Motrin, Advil, Goody's, BC's, all herbal medications, fish oil, and all vitamins, no diclofenac gel  Check your blood sugar the morning of your surgery when you wake up and every 2 hours until you get to the Short Stay unit.  If your blood sugar is less than 70 mg/dL, you will need to treat for low blood sugar: Do not take insulin. Treat a low blood sugar (less than 70 mg/dL) with   cup of clear juice (cranberry or apple), 4 glucose tablets, OR glucose gel. Recheck blood sugar in 15 minutes after treatment (to make sure it is greater than 70 mg/dL). If your blood sugar is not greater than 70 mg/dL on recheck, call [phone] for further instructions. [facility] is not responsible for any belongings or valuables.    Contacts, glasses, hearing aids, dentures or partials may not be worn into surgery, please bring cases for these belongings   Patients discharged the day of surgery will not be allowed to drive home, and someone needs to stay with them for 24 hours.   SURGICAL WAITING ROOM VISITATION You may have 1 visitor in the pre-op area at a time determined by the pre-op nurse. (Visitor may not switch out) Patients having surgery or a procedure in a hospital may have two support people in the waiting room. Children under the age of 43 must have an adult with them who is not the patient. They may stay in the waiting area during the procedure and may switch out with other visitors. If the patient needs to stay at the hospital during part of their recovery, the visitor guidelines for inpatient rooms apply.  Please refer to the [facility] website for the visitor guidelines for Inpatients (after your surgery is over and you are in a regular room).     Special instructions:    Oral Hygiene is also important to reduce your risk  of infection.  Remember - BRUSH YOUR TEETH THE MORNING OF SURGERY WITH YOUR REGULAR TOOTHPASTE   Day of Surgery:  Take a shower the day of or night before with antibacterial soap. Wear Clean/Comfortable clothing the morning of surgery Do not apply any deodorants/lotions.   Do not wear jewelry or makeup Do not wear lotions, powders, perfumes/colognes, or deodorant. Do not shave 48 hours prior to surgery.  Men may shave face and neck. Do not bring valuables to the hospital. Do not wear nail polish, gel polish, artificial nails, or any  other type of covering on natural nails (fingers and toes) If you have artificial nails or gel coating that need to be removed by a nail salon, please have this removed prior to surgery. Artificial nails or gel coating may interfere with anesthesia's ability to adequately monitor your vital signs. Remember to brush your teeth WITH YOUR REGULAR TOOTHPASTE.

## 2021-11-23 NOTE — Progress Notes (Signed)
Anesthesia Chart Review: [name]  Case: [id number] Date/Time: 11/26/21 1200   Procedure: RIGHT BREAST LUMPECTOMY WITH SENTINEL LYMPH NODE BX (Right: Breast)   Anesthesia type: General   Pre-op diagnosis: RIGHT BREAST CANCER   Location: [facility] OR ROOM 08 / [place] [facility]   Surgeons: Coralie Keens, MD       DISCUSSION: Patient is a 61 year old female scheduled for the above procedure.  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  [name] has an up-to-date CBC and CMP from 11/19/21. A1c was elevated at 8.7% on 10/09/21 and reports fasting CBGs typically ~ 140-150. Last EKG 08/07/20.   [name] did not come in for a PAT visit, so [name] is a same day work-up. Anesthesia team to evaluate on the day of surgery.    VS:  BP Readings from Last 3 Encounters:  11/19/21 (!) 148/91  10/09/21 138/84  12/12/20 (!) 158/88   Pulse Readings from Last 3 Encounters:  11/19/21 72  10/09/21 88  12/12/20 90     PROVIDERS: Tower, Wynelle Fanny, MD is PCP  Earlie Server, MD ims HEM-ONC   LABS: Most recent lab results from [facility] include: Lab Results  Component Value Date   WBC 4.0 11/19/2021   HGB 12.3 11/19/2021   HCT 37.7 11/19/2021   PLT 150 11/19/2021   GLUCOSE 122 (H) 11/19/2021   CHOL 196 10/09/2021   TRIG 72.0 10/09/2021   HDL 52.40 10/09/2021   LDLCALC 129 (H) 10/09/2021   ALT 15 11/19/2021   AST 23 11/19/2021   NA 137 11/19/2021   K 3.9 11/19/2021   CL 105 11/19/2021   CREATININE 0.69 11/19/2021   BUN 14 11/19/2021   CO2 23 11/19/2021   TSH 1.43 10/09/2021   HGBA1C 8.7 (H) 10/09/2021   MICROALBUR 1.7 10/09/2021     EKG: Last EKG available for review is from 08/08/20: Normal sinus rhythm Nonspecific T wave abnormality Abnormal ECG Confirmed by  Addison Lank [phone]) on 08/08/2020 1:08:46 PM   CV: Nuclear stress test 09/19/10: IMPRESSION:  1.Normal exercise Myoview. There is a fixed defect in the anterior wall  which likely represents breast attenuation.  There is no evidence of  exercise-induced ischemia on EKG.  2.Normal LV function with EF of 83%.  3.Brisk heart rate response to exercise likely suggestive of  deconditioning.  4.Would continue risk factor modification. I am wondering if [name] primary  issue may have been a tachyarrhythmia. If symptoms recur, would consider  further evaluation with event monitor.     Past Medical History:  Diagnosis Date   Allergy    Anal intraepithelial neoplasia II (AIN II)    Arthritis    shoulder, feet   B12 deficiency    Essential hypertension    followed by pcp--- currently no meds,  states when [name] stopped sinus medication otc , bp better   High frequency hearing loss    History of abnormal Pap smear    History of uterine leiomyoma    Hx of migraine headaches    Iron deficiency anemia    Type 2 diabetes mellitus ([place])    followed by pcp    Past Surgical History:  Procedure Laterality Date   BUNIONECTOMY Bilateral 1982   CARDIOVASCULAR STRESS TEST  09-19-2010   @ [place]   normal nuclear study w/ no evidence ishemia/  normal LV funciton and wall motion , ef 81%   COLONOSCOPY WITH ESOPHAGOGASTRODUODENOSCOPY (EGD)  last one 08-21-2017   HIGH RESOLUTION ANOSCOPY N/A 10/09/2017   Procedure: HIGH RESOLUTION ANOSCOPY,  BIOPSY  EXCISION;  Surgeon: Leighton Ruff, MD;  Location: [facility];  Service: General;  Laterality: N/A;   LAPAROSCOPIC ASSISTED VAGINAL HYSTERECTOMY  09-18-2005    dr Kennon Rounds  [place]  09-04-2001    dr [facility]   w/ lysis adhesions   NASAL ENDOSCOPY  03/2007   neg   TONSILLECTOMY  child    MEDICATIONS: No current facility-administered medications for this encounter.    amLODipine (NORVASC) 5 MG tablet    diclofenac sodium (VOLTAREN) 1 % GEL   loratadine (CLARITIN) 10 MG tablet   metFORMIN (GLUCOPHAGE) 1000 MG tablet    Myra Gianotti, PA-C Surgical Short Stay/Anesthesiology [facility] Phone [phone] [facility] Phone [phone] 11/23/2021 12:13 PM

## 2021-11-23 NOTE — Anesthesia Preprocedure Evaluation (Signed)
Anesthesia Evaluation  Patient identified by MRN, date of birth, ID band Patient awake    Reviewed: Allergy & Precautions, NPO status , Patient's Chart, lab work & pertinent test results  History of Anesthesia Complications Negative for: history of anesthetic complications  Airway Mallampati: II  TM Distance: >3 FB Neck ROM: Full    Dental  (+) Teeth Intact, Dental Advisory Given   Pulmonary neg pulmonary ROS,    breath sounds clear to auscultation       Cardiovascular hypertension, Pt. on medications (-) angina Rhythm:Regular Rate:Normal     Neuro/Psych  Headaches, Anxiety    GI/Hepatic negative GI ROS, Neg liver ROS,   Endo/Other  diabetes (glu 137), Oral Hypoglycemic AgentsBMI 33  Renal/GU negative Renal ROS     Musculoskeletal  (+) Arthritis ,   Abdominal (+) + obese,   Peds  Hematology negative hematology ROS (+)   Anesthesia Other Findings Breast cancer  Reproductive/Obstetrics                           Anesthesia Physical Anesthesia Plan  ASA: 3  Anesthesia Plan: General   Post-op Pain Management: Regional block* and Tylenol PO (pre-op)*   Induction: Intravenous  PONV Risk Score and Plan: 3 and Ondansetron, Dexamethasone and Scopolamine patch - Pre-op  Airway Management Planned: LMA  Additional Equipment: None  Intra-op Plan:   Post-operative Plan:   Informed Consent: I have reviewed the patients History and Physical, chart, labs and discussed the procedure including the risks, benefits and alternatives for the proposed anesthesia with the patient or authorized representative who has indicated his/her understanding and acceptance.     Dental advisory given  Plan Discussed with: CRNA and Surgeon  Anesthesia Plan Comments: (PAT note written 11/23/2021 by Myra Gianotti, PA-C. Plan routine monitors, GA with PEC block for post op analgesia )     Anesthesia Quick  Evaluation

## 2021-11-25 NOTE — H&P (Signed)
REFERRING PHYSICIAN: Tower, Carmell Austria, MD  PROVIDER: Beverlee Nims, MD  MRN: [id number] DOB: 07-19-1960 DATE OF ENCOUNTER: 11/19/2021 Subjective   Chief Complaint: New Consultation (Right Breast )   History of Present Illness: [name] is a 61 y.o. female who is seen today as an office consultation for evaluation of New Consultation (Right Breast ) .   This is a 61 year old female who presents with a newly diagnosed right breast cancer. She felt a mass in her right breast approximately 2 months ago. She had a mammogram and ultrasound showing the 2 cm mass which appears to be at about the 11 o'clock position of the right breast several centimeters from the nipple. Ultrasound the axilla was unremarkable. The had a biopsy of the mass showing an invasive ductal carcinoma which was greater than 90% ER/PR positive and HER2 negative. A Ki-67 was not performed. She has already been to the cancer center and seen medical oncology in [place]. She has had no previous problems regarding her breast. There is no family history of breast cancer. She denies nipple discharge. She is otherwise without complaints.  Review of Systems: A complete review of systems was obtained from the patient. I have reviewed this information and discussed as appropriate with the patient. See HPI as well for other ROS.  ROS   Medical History: Past Medical History:  Diagnosis Date  Anemia  Anxiety  Arthritis  Diabetes mellitus without complication (CMS-HCC)  History of cancer  Hypertension   Patient Active Problem List  Diagnosis  AIN (anal intraepithelial neoplasia) anal canal  Allergic rhinitis  Anemia, B12 deficiency  Anxiety  Chronic headache  Class 2 severe obesity due to excess calories with serious comorbidity and body mass index (BMI) of 35.0 to 35.9 in adult (CMS-HCC)  Dyspepsia  Dysuria  Elevated blood pressure reading without diagnosis of hypertension  Epigastric pain   Essential hypertension  Gastroparesis  Hair loss  Hyperlipidemia associated with type 2 diabetes mellitus (CMS-HCC)  Lower back pain  Lump, breast  Need for hepatitis C screening test  Rectal bleeding  HIV -Screening  Thrombocytopenia (CMS-HCC)  Type 2 diabetes mellitus without complications (CMS-HCC)  Urinary frequency  Vaginal atrophy   Past Surgical History:  Procedure Laterality Date  Bunionectomy Bilateral 1982  HYSTERECTOMY 09/18/2005  Vaginal  Cardiovascular Stress Test 09/19/2010  COLONOSCOPY 08/21/2017  EGD 08/21/2017  NASAL ENDOSCOPY 10/09/2017  Mymectomy Abd approach 09/04/2021  TONSILLECTOMY  as a child    Allergies  Allergen Reactions  Lisinopril Cough  Losartan Other (See Comments)  Chest pressure  Amoxicillin-Pot Clavulanate Rash  REACTION: Itchy rash   Current Outpatient Medications on File Prior to Visit  Medication Sig Dispense Refill  metFORMIN (GLUCOPHAGE) 1000 MG tablet TAKE 1 TABLET (1,000 MG TOTAL) BY MOUTH TWICE A DAY WITH FOOD  amLODIPine (NORVASC) 5 MG tablet Take 5 mg by mouth once daily   No current facility-administered medications on file prior to visit.   History reviewed. No pertinent family history.   Social History   Tobacco Use  Smoking Status Never  Smokeless Tobacco Never    Social History   Socioeconomic History  Marital status: Single  Tobacco Use  Smoking status: Never  Smokeless tobacco: Never  Substance and Sexual Activity  Alcohol use: Yes  Drug use: Never   Objective:   Vitals:  11/19/21 1453  BP: (!) 174/82  Pulse: 100  Temp: 36.2 C (97.1 F)  SpO2: 98%  Weight: 84.6 kg (186 lb 9.6 oz)  Height: 160 cm (5' 3" )   Body mass index is 33.05 kg/m.  Physical Exam   She appears well on exam  She does have a palpable mass at the 11:30 position of the right breast several centimeters from the nipple. It is mobile.  There is no axillary adenopathy  Nipple areolar complex is normal  Labs,  Imaging and Diagnostic Testing: I have reviewed her mammograms, ultrasound, and pathology results  Assessment and Plan:   Diagnoses and all orders for this visit:  Invasive ductal carcinoma of breast, female, right (CMS-HCC)    I had a discussion with the patient and her family regarding her diagnosis of an invasive ductal carcinoma of the right breast. We discussed that the treatment of breast cancer is with the multidisciplinary approach. From a surgical standpoint we discussed breast conservation versus mastectomy. She is interested in breast conservation. I next discussed proceeding with a right breast lumpectomy as the mass is palpable and a sentinel lymph node biopsy to remove nodes in her axilla to rule out malignancy spread there. I discussed the reasonings for this with her in detail. I explained the surgical procedure in detail. We discussed the risk of surgery which includes but is not limited to bleeding, infection, injury to surrounding structures, the need for further surgery if the margins or lymph nodes are positive, cardiopulmonary issues, postoperative recovery, etc. She understands and wishes to proceed with surgery which will be scheduled

## 2021-11-26 ENCOUNTER — Ambulatory Visit (HOSPITAL_COMMUNITY): Payer: BC Managed Care – PPO | Admitting: Vascular Surgery

## 2021-11-26 ENCOUNTER — Encounter (HOSPITAL_COMMUNITY): Payer: Self-pay | Admitting: Surgery

## 2021-11-26 ENCOUNTER — Encounter (HOSPITAL_COMMUNITY)
Admission: RE | Disposition: A | Discharge status: Home / Self Care | Payer: Self-pay | Source: Home / Self Care | Attending: Surgery

## 2021-11-26 ENCOUNTER — Other Ambulatory Visit: Payer: Self-pay

## 2021-11-26 ENCOUNTER — Ambulatory Visit (HOSPITAL_COMMUNITY)
Admission: RE | Admit: 2021-11-26 | Discharge: 2021-11-26 | Disposition: A | Discharge status: Home / Self Care | Payer: BC Managed Care – PPO | Attending: Surgery | Admitting: Surgery

## 2021-11-26 DIAGNOSIS — K3184 Gastroparesis: Secondary | ICD-10-CM | POA: Insufficient documentation

## 2021-11-26 DIAGNOSIS — E1143 Type 2 diabetes mellitus with diabetic autonomic (poly)neuropathy: Secondary | ICD-10-CM | POA: Diagnosis not present

## 2021-11-26 DIAGNOSIS — C50411 Malignant neoplasm of upper-outer quadrant of right female breast: Secondary | ICD-10-CM | POA: Insufficient documentation

## 2021-11-26 DIAGNOSIS — I10 Essential (primary) hypertension: Secondary | ICD-10-CM | POA: Insufficient documentation

## 2021-11-26 DIAGNOSIS — Z6833 Body mass index (BMI) 33.0-33.9, adult: Secondary | ICD-10-CM | POA: Diagnosis not present

## 2021-11-26 DIAGNOSIS — E669 Obesity, unspecified: Secondary | ICD-10-CM | POA: Diagnosis not present

## 2021-11-26 DIAGNOSIS — Z79899 Other long term (current) drug therapy: Secondary | ICD-10-CM | POA: Diagnosis not present

## 2021-11-26 DIAGNOSIS — Z170 Estrogen receptor positive status [ER+]: Secondary | ICD-10-CM | POA: Diagnosis not present

## 2021-11-26 DIAGNOSIS — Z7984 Long term (current) use of oral hypoglycemic drugs: Secondary | ICD-10-CM | POA: Insufficient documentation

## 2021-11-26 DIAGNOSIS — C50911 Malignant neoplasm of unspecified site of right female breast: Secondary | ICD-10-CM | POA: Diagnosis not present

## 2021-11-26 DIAGNOSIS — H9190 Unspecified hearing loss, unspecified ear: Secondary | ICD-10-CM | POA: Insufficient documentation

## 2021-11-26 HISTORY — PX: BREAST LUMPECTOMY WITH SENTINEL LYMPH NODE BIOPSY: SHX5597

## 2021-11-26 LAB — GLUCOSE, CAPILLARY
Glucose-Capillary: 137 mg/dL — ABNORMAL HIGH (ref 70–99)
Glucose-Capillary: 176 mg/dL — ABNORMAL HIGH (ref 70–99)

## 2021-11-26 SURGERY — BREAST LUMPECTOMY WITH SENTINEL LYMPH NODE BX
Anesthesia: General | Site: Breast | Laterality: Right

## 2021-11-26 MED ORDER — MIDAZOLAM HCL 2 MG/2ML IJ SOLN
INTRAMUSCULAR | Status: AC
  Administered 2021-11-26: 2 mg via INTRAVENOUS
  Filled 2021-11-26: qty 2

## 2021-11-26 MED ORDER — FENTANYL CITRATE (PF) 100 MCG/2ML IJ SOLN
INTRAMUSCULAR | Status: AC
  Administered 2021-11-26: 100 ug via INTRAVENOUS
  Filled 2021-11-26: qty 2

## 2021-11-26 MED ORDER — OXYCODONE HCL 5 MG/5ML PO SOLN: 5.0000 mg | Freq: Once | ORAL | Status: DC | PRN

## 2021-11-26 MED ORDER — CHLORHEXIDINE GLUCONATE CLOTH 2 % EX PADS: 6.0000 | MEDICATED_PAD | Freq: Once | CUTANEOUS | Status: DC

## 2021-11-26 MED ORDER — DIPHENHYDRAMINE HCL 50 MG/ML IJ SOLN
INTRAMUSCULAR | Status: AC
Start: 2021-11-26 — End: ?
  Filled 2021-11-26: qty 1

## 2021-11-26 MED ORDER — FENTANYL CITRATE (PF) 250 MCG/5ML IJ SOLN
INTRAMUSCULAR | Status: DC | PRN
Start: 2021-11-26 — End: 2021-11-26
  Administered 2021-11-26 (×4): 25 ug via INTRAVENOUS
  Administered 2021-11-26: 50 ug via INTRAVENOUS

## 2021-11-26 MED ORDER — PROPOFOL 10 MG/ML IV BOLUS
INTRAVENOUS | Status: DC | PRN
  Administered 2021-11-26: 150 mg via INTRAVENOUS

## 2021-11-26 MED ORDER — INSULIN ASPART 100 UNIT/ML IJ SOLN: 0.0000 [IU] | INTRAMUSCULAR | Status: DC | PRN

## 2021-11-26 MED ORDER — PROMETHAZINE HCL 25 MG/ML IJ SOLN
INTRAMUSCULAR | Status: AC
  Filled 2021-11-26: qty 1

## 2021-11-26 MED ORDER — ACETAMINOPHEN 500 MG PO TABS: 1000.0000 mg | ORAL_TABLET | ORAL | Status: AC

## 2021-11-26 MED ORDER — EPHEDRINE SULFATE-NACL 50-0.9 MG/10ML-% IV SOSY
PREFILLED_SYRINGE | INTRAVENOUS | Status: DC | PRN
  Administered 2021-11-26 (×2): 5 mg via INTRAVENOUS

## 2021-11-26 MED ORDER — ORAL CARE MOUTH RINSE
15.0000 mL | Freq: Once | OROMUCOSAL | Status: AC
Start: 2021-11-26 — End: 2021-11-26

## 2021-11-26 MED ORDER — BUPIVACAINE-EPINEPHRINE (PF) 0.25% -1:200000 IJ SOLN
INTRAMUSCULAR | Status: AC
  Filled 2021-11-26: qty 30

## 2021-11-26 MED ORDER — EPHEDRINE 5 MG/ML INJ
INTRAVENOUS | Status: AC
  Filled 2021-11-26: qty 5

## 2021-11-26 MED ORDER — MIDAZOLAM HCL 2 MG/2ML IJ SOLN: 0.5000 mg | Freq: Once | INTRAMUSCULAR | Status: DC | PRN

## 2021-11-26 MED ORDER — ENSURE PRE-SURGERY PO LIQD: 296.0000 mL | Freq: Once | ORAL | Status: DC

## 2021-11-26 MED ORDER — ACETAMINOPHEN 500 MG PO TABS
1000.0000 mg | ORAL_TABLET | Freq: Once | ORAL | Status: AC
Start: 2021-11-26 — End: 2021-11-26
  Administered 2021-11-26: 1000 mg via ORAL
  Filled 2021-11-26: qty 2

## 2021-11-26 MED ORDER — PROMETHAZINE HCL 25 MG/ML IJ SOLN
6.2500 mg | INTRAMUSCULAR | Status: DC | PRN
  Administered 2021-11-26: 6.25 mg via INTRAVENOUS

## 2021-11-26 MED ORDER — 0.9 % SODIUM CHLORIDE (POUR BTL) OPTIME
TOPICAL | Status: DC | PRN
  Administered 2021-11-26: 1000 mL

## 2021-11-26 MED ORDER — TRAMADOL HCL 50 MG PO TABS: 50.0000 mg | ORAL_TABLET | Freq: Four times a day (QID) | ORAL | 0 refills | Status: DC | PRN

## 2021-11-26 MED ORDER — MIDAZOLAM HCL 2 MG/2ML IJ SOLN: 2.0000 mg | Freq: Once | INTRAMUSCULAR | Status: AC

## 2021-11-26 MED ORDER — PHENYLEPHRINE 80 MCG/ML (10ML) SYRINGE FOR IV PUSH (FOR BLOOD PRESSURE SUPPORT)
PREFILLED_SYRINGE | INTRAVENOUS | Status: AC
  Filled 2021-11-26: qty 10

## 2021-11-26 MED ORDER — FENTANYL CITRATE (PF) 250 MCG/5ML IJ SOLN
INTRAMUSCULAR | Status: AC
  Filled 2021-11-26: qty 5

## 2021-11-26 MED ORDER — MIDAZOLAM HCL 2 MG/2ML IJ SOLN
INTRAMUSCULAR | Status: AC
  Filled 2021-11-26: qty 2

## 2021-11-26 MED ORDER — MAGTRACE LYMPHATIC TRACER
INTRAMUSCULAR | Status: DC | PRN
  Administered 2021-11-26: 2 mL via INTRAMUSCULAR

## 2021-11-26 MED ORDER — LACTATED RINGERS IV SOLN: INTRAVENOUS | Status: DC

## 2021-11-26 MED ORDER — OXYCODONE HCL 5 MG PO TABS: 5.0000 mg | ORAL_TABLET | Freq: Once | ORAL | Status: DC | PRN

## 2021-11-26 MED ORDER — HYDROMORPHONE HCL 1 MG/ML IJ SOLN: 0.2500 mg | INTRAMUSCULAR | Status: DC | PRN

## 2021-11-26 MED ORDER — FENTANYL CITRATE (PF) 100 MCG/2ML IJ SOLN: 100.0000 ug | Freq: Once | INTRAMUSCULAR | Status: AC

## 2021-11-26 MED ORDER — PHENYLEPHRINE 80 MCG/ML (10ML) SYRINGE FOR IV PUSH (FOR BLOOD PRESSURE SUPPORT)
PREFILLED_SYRINGE | INTRAVENOUS | Status: DC | PRN
  Administered 2021-11-26: 40 ug via INTRAVENOUS
  Administered 2021-11-26: 80 ug via INTRAVENOUS

## 2021-11-26 MED ORDER — LIDOCAINE 2% (20 MG/ML) 5 ML SYRINGE
INTRAMUSCULAR | Status: DC | PRN
  Administered 2021-11-26: 40 mg via INTRAVENOUS

## 2021-11-26 MED ORDER — MEPERIDINE HCL 25 MG/ML IJ SOLN: 6.2500 mg | INTRAMUSCULAR | Status: DC | PRN

## 2021-11-26 MED ORDER — SUCCINYLCHOLINE CHLORIDE 200 MG/10ML IV SOSY
PREFILLED_SYRINGE | INTRAVENOUS | Status: AC
Start: 2021-11-26 — End: ?
  Filled 2021-11-26: qty 10

## 2021-11-26 MED ORDER — ONDANSETRON HCL 4 MG/2ML IJ SOLN
INTRAMUSCULAR | Status: DC | PRN
  Administered 2021-11-26: 4 mg via INTRAVENOUS

## 2021-11-26 MED ORDER — LIDOCAINE 2% (20 MG/ML) 5 ML SYRINGE
INTRAMUSCULAR | Status: AC
  Filled 2021-11-26: qty 5

## 2021-11-26 MED ORDER — DEXAMETHASONE SODIUM PHOSPHATE 10 MG/ML IJ SOLN
INTRAMUSCULAR | Status: AC
  Filled 2021-11-26: qty 1

## 2021-11-26 MED ORDER — PROPOFOL 10 MG/ML IV BOLUS
INTRAVENOUS | Status: AC
  Filled 2021-11-26: qty 20

## 2021-11-26 MED ORDER — SCOPOLAMINE 1 MG/3DAYS TD PT72
1.0000 | MEDICATED_PATCH | TRANSDERMAL | Status: DC
  Administered 2021-11-26: 1.5 mg via TRANSDERMAL
  Filled 2021-11-26: qty 1

## 2021-11-26 MED ORDER — BUPIVACAINE-EPINEPHRINE (PF) 0.5% -1:200000 IJ SOLN
INTRAMUSCULAR | Status: DC | PRN
  Administered 2021-11-26: 30 mL

## 2021-11-26 MED ORDER — DIPHENHYDRAMINE HCL 50 MG/ML IJ SOLN
INTRAMUSCULAR | Status: DC | PRN
  Administered 2021-11-26: 12.5 mg via INTRAVENOUS

## 2021-11-26 MED ORDER — CHLORHEXIDINE GLUCONATE 0.12 % MT SOLN
15.0000 mL | Freq: Once | OROMUCOSAL | Status: AC
  Administered 2021-11-26: 15 mL via OROMUCOSAL
  Filled 2021-11-26: qty 15

## 2021-11-26 MED ORDER — CIPROFLOXACIN IN D5W 400 MG/200ML IV SOLN
400.0000 mg | INTRAVENOUS | Status: AC
  Administered 2021-11-26: 400 mg via INTRAVENOUS
  Filled 2021-11-26: qty 200

## 2021-11-26 MED ORDER — ONDANSETRON HCL 4 MG/2ML IJ SOLN
INTRAMUSCULAR | Status: AC
  Filled 2021-11-26: qty 2

## 2021-11-26 SURGICAL SUPPLY — 49 items
ADH SKN CLS APL DERMABOND .7 (GAUZE/BANDAGES/DRESSINGS) ×1
APL PRP STRL LF DISP 70% ISPRP (MISCELLANEOUS) ×1
APPLIER CLIP 9.375 MED OPEN (MISCELLANEOUS) ×1
APR CLP MED 9.3 20 MLT OPN (MISCELLANEOUS) ×1
BAG COUNTER SPONGE SURGICOUNT (BAG) ×2 IMPLANT
BAG SPNG CNTER NS LX DISP (BAG) ×1
BINDER BREAST XLRG (GAUZE/BANDAGES/DRESSINGS) IMPLANT
CANISTER SUCT 3000ML PPV (MISCELLANEOUS) ×2 IMPLANT
CHLORAPREP W/TINT 26 (MISCELLANEOUS) ×2 IMPLANT
CLIP APPLIE 9.375 MED OPEN (MISCELLANEOUS) IMPLANT
CNTNR URN SCR LID CUP LEK RST (MISCELLANEOUS) ×2 IMPLANT
CONT SPEC 4OZ STRL OR WHT (MISCELLANEOUS) ×1
COVER PROBE W GEL 5X96 (DRAPES) ×2 IMPLANT
COVER SURGICAL LIGHT HANDLE (MISCELLANEOUS) ×2 IMPLANT
DERMABOND ADVANCED (GAUZE/BANDAGES/DRESSINGS) ×1
DERMABOND ADVANCED .7 DNX12 (GAUZE/BANDAGES/DRESSINGS) ×2 IMPLANT
DEVICE DUBIN SPECIMEN MAMMOGRA (MISCELLANEOUS) IMPLANT
DRAPE LAPAROSCOPIC ABDOMINAL (DRAPES) ×2 IMPLANT
DRSG TEGADERM 4X4.75 (GAUZE/BANDAGES/DRESSINGS) ×2 IMPLANT
ELECT REM PT RETURN 9FT ADLT (ELECTROSURGICAL) ×1
ELECTRODE REM PT RTRN 9FT ADLT (ELECTROSURGICAL) ×2 IMPLANT
GAUZE SPONGE 4X4 12PLY STRL (GAUZE/BANDAGES/DRESSINGS) ×2 IMPLANT
GLOVE SURG SIGNA 7.5 PF LTX (GLOVE) ×2 IMPLANT
GOWN STRL REUS W/ TWL LRG LVL3 (GOWN DISPOSABLE) ×2 IMPLANT
GOWN STRL REUS W/ TWL XL LVL3 (GOWN DISPOSABLE) ×2 IMPLANT
GOWN STRL REUS W/TWL LRG LVL3 (GOWN DISPOSABLE) ×1
GOWN STRL REUS W/TWL XL LVL3 (GOWN DISPOSABLE) ×1
KIT BASIN OR (CUSTOM PROCEDURE TRAY) ×2 IMPLANT
KIT MARKER MARGIN INK (KITS) IMPLANT
KIT TURNOVER KIT B (KITS) ×2 IMPLANT
NDL 18GX1X1/2 (RX/OR ONLY) (NEEDLE) ×2 IMPLANT
NDL FILTER BLUNT 18X1 1/2 (NEEDLE) IMPLANT
NDL HYPO 25GX1X1/2 BEV (NEEDLE) ×4 IMPLANT
NEEDLE 18GX1X1/2 (RX/OR ONLY) (NEEDLE) ×1 IMPLANT
NEEDLE FILTER BLUNT 18X 1/2SAF (NEEDLE)
NEEDLE FILTER BLUNT 18X1 1/2 (NEEDLE) IMPLANT
NEEDLE HYPO 25GX1X1/2 BEV (NEEDLE) ×2 IMPLANT
NS IRRIG 1000ML POUR BTL (IV SOLUTION) ×2 IMPLANT
PACK GENERAL/GYN (CUSTOM PROCEDURE TRAY) ×2 IMPLANT
PAD ARMBOARD 7.5X6 YLW CONV (MISCELLANEOUS) ×2 IMPLANT
SPONGE T-LAP 4X18 ~~LOC~~+RFID (SPONGE) ×2 IMPLANT
SUT MNCRL AB 4-0 PS2 18 (SUTURE) IMPLANT
SUT MON AB 4-0 PC3 18 (SUTURE) ×2 IMPLANT
SUT VIC AB 3-0 SH 27 (SUTURE) ×2
SUT VIC AB 3-0 SH 27XBRD (SUTURE) ×2 IMPLANT
SYR CONTROL 10ML LL (SYRINGE) ×4 IMPLANT
TOWEL GREEN STERILE (TOWEL DISPOSABLE) ×2 IMPLANT
TOWEL GREEN STERILE FF (TOWEL DISPOSABLE) ×2 IMPLANT
TRACER MAGTRACE VIAL (MISCELLANEOUS) IMPLANT

## 2021-11-26 NOTE — Anesthesia Postprocedure Evaluation (Signed)
Anesthesia Post Note  Patient: [name]  Procedure(s) Performed: RIGHT BREAST LUMPECTOMY WITH SENTINEL LYMPH NODE BIOPSY (Right: Breast)     Patient location during evaluation: PACU Anesthesia Type: General Level of consciousness: awake and alert, patient cooperative and oriented Pain management: pain level controlled Vital Signs Assessment: post-procedure vital signs reviewed and stable Respiratory status: spontaneous breathing, nonlabored ventilation and respiratory function stable Cardiovascular status: blood pressure returned to baseline and stable Postop Assessment: no apparent nausea or vomiting and able to ambulate Anesthetic complications: no   No notable events documented.  Last Vitals:  Vitals:   11/26/21 1415 11/26/21 1430  BP: 128/83 128/75  Pulse: 70 70  Resp: 12 15  Temp:  36.8 C  SpO2: 92% 97%    Last Pain:  Vitals:   11/26/21 1430  TempSrc:   PainSc: 0-No pain                 Jordain Radin,E. Pope Brunty

## 2021-11-26 NOTE — Anesthesia Procedure Notes (Signed)
Anesthesia Regional Block: Pectoralis block   Pre-Anesthetic Checklist: , timeout performed,  Correct Patient, Correct Site, Correct Laterality,  Correct Procedure, Correct Position, site marked,  Risks and benefits discussed,  Surgical consent,  Pre-op evaluation,  At surgeon's request and post-op pain management  Laterality: Right  Prep: chloraprep       Needles:  Injection technique: Single-shot  Needle Type: Echogenic Needle     Needle Length: 9cm  Needle Gauge: 21     Additional Needles:   Procedures:,,,, ultrasound used (permanent image in chart),,    Narrative:  Start time: 11/26/2021 11:37 AM End time: 11/26/2021 11:43 AM Injection made incrementally with aspirations every 5 mL.  Performed by: Personally  Anesthesiologist: Annye Asa, MD  Additional Notes: Pt identified in Holding room.  Monitors applied. Working IV access confirmed. Sterile prep R clavicle and pec.  #21ga ECHOgenic Arrow block between pec minor and serratus, and then pec major and minor with US guidance.  Total 30cc 0.5% Bupivacaine 1:200k epi injected incrementally after negative test dose.  Patient asymptomatic, VSS, no heme aspirated, tolerated well.   Jenita Seashore, MD

## 2021-11-26 NOTE — Anesthesia Procedure Notes (Addendum)
Procedure Name: LMA Insertion Date/Time: 11/26/2021 11:59 AM  Performed by: Thelma Comp, CRNAPre-anesthesia Checklist: Patient identified, Emergency Drugs available, Suction available and Patient being monitored Patient Re-evaluated:Patient Re-evaluated prior to induction Oxygen Delivery Method: Circle System Utilized Preoxygenation: Pre-oxygenation with 100% oxygen Induction Type: IV induction Ventilation: Mask ventilation without difficulty LMA: LMA inserted LMA Size: 4.0 Number of attempts: 1 Placement Confirmation: positive ETCO2 Tube secured with: Tape Dental Injury: Teeth and Oropharynx as per pre-operative assessment

## 2021-11-26 NOTE — Interval H&P Note (Signed)
History and Physical Interval Note: no change in H and P  11/26/2021 11:41 AM  [name]  has presented today for surgery, with the diagnosis of RIGHT BREAST CANCER.  The various methods of treatment have been discussed with the patient and family. After consideration of risks, benefits and other options for treatment, the patient has consented to  Procedure(s): RIGHT BREAST LUMPECTOMY WITH SENTINEL LYMPH NODE BX (Right) as a surgical intervention.  The patient's history has been reviewed, patient examined, no change in status, stable for surgery.  I have reviewed the patient's chart and labs.  Questions were answered to the patient's satisfaction.     Coralie Keens

## 2021-11-26 NOTE — Op Note (Addendum)
   [name] 11/26/2021   Pre-op Diagnosis: RIGHT BREAST CANCER     Post-op Diagnosis: same  Procedure(s): RIGHT BREAST LUMPECTOMY WITH SENTINEL LYMPH NODE BIOPSY INJECTION OF MAGTRACE FOR LYMPH NODE MAPPING   Surgeon(s): Coralie Keens, MD  Anesthesia: General  Staff:  Circulator: Zannie Kehr, RN Scrub Person: Patrcia Dolly, RN; Dollene Cleveland T  Estimated Blood Loss: Minimal               Specimens: SENT TO PATH  Indications: This is a 61 year old female who presented with a palpable mass in her right breast.  She underwent an ultrasound that showed a 2 cm.  A biopsy of the mass showed invasive ductal carcinoma.  Ultrasound of the axilla was negative.  Decision was made to proceed with right breast lumpectomy and sentinel lymph node biopsy  Procedure: The patient was brought to the operating room identifies correct patient.  She was placed upon the operating table and anesthesia was induced.  I prepped the nipple areolar complex and then injected mag trace underneath the the nipple and massaged the breast.  Her right breast and axilla were then prepped and draped in usual sterile fashion.  I anesthetized skin at the upper outer edge of the right areola with Marcaine and then made incision with a scalpel.  I then dissected superiorly toward the palpable breast mass.  I then widely excised the breast mass with the electrocautery completed a lumpectomy coming underneath the palpable tumor.  Once the mass was removed, I did x-rayed confirming that the mass was in the lumpectomy specimen including the previous biopsy clip.  I marked all margins with paint and the specimen was sent to pathology for evaluation. With the mag trace, I then identified an area of increased uptake in the right axilla.  I anesthetized the skin with Marcaine and made incision with a scalpel.  I then dissected down into the deep axillary tissue.  The patient had a few slightly enlarged, hard  lymph nodes which had uptake of the mag trace which I excised along with some lymph nodes more superficial to this that had uptake with mag trace as well.  These were sent to pathology for evaluation.  Hemostasis was achieved in the axilla with the cautery and surgical clips.  I reevaluated the lumpectomy cavity and injected further with Marcaine.  Hemostasis peer to be achieved.  I placed surgical clips around the lumpectomy cavity.  I then closed the subcutaneous tissue with interrupted 3-0 Vicryl sutures and closed the skin with a running 4-0 Monocryl.  I likewise closed the axillary incision with 3-0 Vicryl and 4-0 Monocryl as well.  Dermabond was placed to both incisions.  The patient tolerated the procedure well.  She was placed in a breast binder.  She was then extubated in the operating room and taken in a stable condition to the recovery room.      Coralie Keens   Date: 11/26/2021  Time: 1:19 PM

## 2021-11-26 NOTE — Discharge Instructions (Signed)
[facility] °Office Phone Number [phone] ° °BREAST BIOPSY/ PARTIAL MASTECTOMY: POST OP INSTRUCTIONS ° °Always review your discharge instruction sheet given to you by the facility where your surgery was performed. ° °IF YOU HAVE DISABILITY OR FAMILY LEAVE FORMS, YOU MUST BRING THEM TO THE OFFICE FOR PROCESSING.  DO NOT GIVE THEM TO YOUR DOCTOR. ° °A prescription for pain medication may be given to you upon discharge.  Take your pain medication as prescribed, if needed.  If narcotic pain medicine is not needed, then you may take acetaminophen (Tylenol) or ibuprofen (Advil) as needed. °Take your usually prescribed medications unless otherwise directed °If you need a refill on your pain medication, please contact your pharmacy.  They will contact our office to request authorization.  Prescriptions will not be filled after 5pm or on week-ends. °You should eat very light the first 24 hours after surgery, such as soup, crackers, pudding, etc.  Resume your normal diet the day after surgery. °Most patients will experience some swelling and bruising in the breast.  Ice packs and a good support bra will help.  Swelling and bruising can take several days to resolve.  °It is common to experience some constipation if taking pain medication after surgery.  Increasing fluid intake and taking a stool softener will usually help or prevent this problem from occurring.  A mild laxative (Milk of Magnesia or Miralax) should be taken according to package directions if there are no bowel movements after 48 hours. °Unless discharge instructions indicate otherwise, you may remove your bandages 24-48 hours after surgery, and you may shower at that time.  You may have steri-strips (small skin tapes) in place directly over the incision.  These strips should be left on the skin for 7-10 days.  If your surgeon used skin glue on the incision, you may shower in 24 hours.  The glue will flake off over the next 2-3 weeks.  Any  sutures or staples will be removed at the office during your follow-up visit. °ACTIVITIES:  You may resume regular daily activities (gradually increasing) beginning the next day.  Wearing a good support bra or sports bra minimizes pain and swelling.  You may have sexual intercourse when it is comfortable. °You may drive when you no longer are taking prescription pain medication, you can comfortably wear a seatbelt, and you can safely maneuver your car and apply brakes. °RETURN TO WORK:  ______________________________________________________________________________________ °You should see your doctor in the office for a follow-up appointment approximately two weeks after your surgery.  Your doctor’s nurse will typically make your follow-up appointment when she calls you with your pathology report.  Expect your pathology report 2-3 business days after your surgery.  You may call to check if you do not hear from us after three days. °OTHER INSTRUCTIONS: OK TO REMOVE THE BINDER AND SHOWER STARTING TOMORROW °ICE PACK, TYLENOL, AND IBUPROFEN ALSO FOR PAIN °NO VIGOROUS ACTIVITY FOR ONE WEEK °_______________________________________________________________________________________________ _____________________________________________________________________________________________________________________________________ °_____________________________________________________________________________________________________________________________________ °_____________________________________________________________________________________________________________________________________ ° °WHEN TO CALL YOUR DOCTOR: °Fever over 101.0 °Nausea and/or vomiting. °Extreme swelling or bruising. °Continued bleeding from incision. °Increased pain, redness, or drainage from the incision. ° °The clinic staff is available to answer your questions during regular business hours.  Please don’t hesitate to call and ask to speak to one of the  nurses for clinical concerns.  If you have a medical emergency, go to the nearest emergency room or call 911.  A surgeon from [facility] is always on call at the hospital. ° °For   further questions, please visit [email or web address]   °

## 2021-11-26 NOTE — Transfer of Care (Signed)
Immediate Anesthesia Transfer of Care Note  Patient: [name]  Procedure(s) Performed: RIGHT BREAST LUMPECTOMY WITH SENTINEL LYMPH NODE BIOPSY (Right: Breast)  Patient Location: PACU  Anesthesia Type:General  Level of Consciousness: drowsy  Airway & Oxygen Therapy: Patient Spontanous Breathing and Patient connected to face mask oxygen  Post-op Assessment: Report given to RN and Post -op Vital signs reviewed and stable  Post vital signs: Reviewed and stable  Last Vitals:  Vitals Value Taken Time  BP    Temp    Pulse    Resp    SpO2      Last Pain:  Vitals:   11/26/21 1043  TempSrc:   PainSc: 0-No pain         Complications: No notable events documented.

## 2021-11-27 ENCOUNTER — Encounter (HOSPITAL_COMMUNITY): Payer: Self-pay | Admitting: Surgery

## 2021-11-28 LAB — SURGICAL PATHOLOGY

## 2021-12-04 ENCOUNTER — Telehealth: Payer: Self-pay | Admitting: *Deleted

## 2021-12-04 NOTE — Telephone Encounter (Signed)
Oncotype Dx order submitted online  Order ID  [id number] on pathology [phone] from 11/26/21

## 2021-12-05 ENCOUNTER — Telehealth: Payer: Self-pay

## 2021-12-05 ENCOUNTER — Encounter: Payer: Self-pay | Admitting: *Deleted

## 2021-12-05 NOTE — Telephone Encounter (Signed)
Please inform pt of appt. Thanks

## 2021-12-05 NOTE — Telephone Encounter (Signed)
-----   Message from Daiva Huge, RN sent at 12/05/2021  3:09 PM EDT ----- Regarding: Follow up with Dr. Tasia Catchings Dr. Tasia Catchings wants to see her back 2 weeks after her oncotype was sent.  I sent it yesterday, can we get her scheduled for a follow up?  Thanks,   [facility]

## 2021-12-05 NOTE — Progress Notes (Signed)
Called [place] to obtain prior [place].  Per automated system, prior auth not needed for procedure code [phone] with confirmation number [id number]

## 2021-12-10 DIAGNOSIS — C50411 Malignant neoplasm of upper-outer quadrant of right female breast: Secondary | ICD-10-CM | POA: Diagnosis not present

## 2021-12-10 DIAGNOSIS — Z170 Estrogen receptor positive status [ER+]: Secondary | ICD-10-CM | POA: Diagnosis not present

## 2021-12-11 ENCOUNTER — Encounter: Payer: Self-pay | Admitting: Oncology

## 2021-12-18 ENCOUNTER — Inpatient Hospital Stay: Payer: BC Managed Care – PPO | Attending: Oncology | Admitting: Oncology

## 2021-12-18 ENCOUNTER — Encounter: Payer: Self-pay | Admitting: Oncology

## 2021-12-18 DIAGNOSIS — C50411 Malignant neoplasm of upper-outer quadrant of right female breast: Secondary | ICD-10-CM | POA: Insufficient documentation

## 2021-12-18 DIAGNOSIS — Z800 Family history of malignant neoplasm of digestive organs: Secondary | ICD-10-CM | POA: Diagnosis not present

## 2021-12-18 DIAGNOSIS — Z170 Estrogen receptor positive status [ER+]: Secondary | ICD-10-CM | POA: Diagnosis not present

## 2021-12-18 DIAGNOSIS — Z780 Asymptomatic menopausal state: Secondary | ICD-10-CM | POA: Diagnosis not present

## 2021-12-18 DIAGNOSIS — Z7189 Other specified counseling: Secondary | ICD-10-CM | POA: Diagnosis not present

## 2021-12-18 NOTE — Assessment & Plan Note (Signed)
FSH is 57.8.  Estradiol less than 5.-Postmenopausal I recommend calcium 1200 mg and vitamin D supplementation 

## 2021-12-18 NOTE — Progress Notes (Signed)
Hematology/Oncology Consult Note [phone] [phone]     REFERRING PROVIDER: Abner Greenspan, MD   Patient Care Team: Tower, Wynelle Fanny, MD as PCP - General  ASSESSMENT & PLAN:   Cancer Staging  Breast cancer in female [facility]) Staging form: Breast, AJCC 8th Edition - Pathologic: Stage IA (pT2, pN0, cM0, G2, ER+, PR+, HER2-, Oncotype DX score: 0) - Signed by Earlie Server, MD on 12/18/2021   Breast cancer in female [facility]) Stage IA breast cancer, ER/PR positive, HER2 negative. Pathology results were reviewed and discussed with patient. Oncotype DX recurrence score 0.  No benefit of adjuvant chemotherapy. Refer to radiation oncology for adjuvant radiation. Patient is postmenopausal.  Recommend adjuvant endocrine therapy after she finishes radiation Check baseline bone density.   Goals of care, counseling/discussion Discussed with patient.  Postmenopausal FSH is 57.8.  Estradiol less than 5.-Postmenopausal I recommend calcium 1200 mg and vitamin D supplementation   Orders Placed This Encounter  Procedures   DG Bone Density    Standing Status:   Future    Standing Expiration Date:   12/18/2022    Order Specific Question:   Reason for Exam (SYMPTOM  OR DIAGNOSIS REQUIRED)    Answer:   breast cancer    Order Specific Question:   Preferred imaging location?    Answer:   [place]   Ambulatory referral to Radiation Oncology    Referral Priority:   Routine    Referral Type:   Consultation    Referral Reason:   Specialty Services Required    Requested Specialty:   Radiation Oncology    Number of Visits Requested:   1   Follow-up to be determined. Follow-up 2 to 3 weeks after radiation.   All questions were answered. The patient knows to call the clinic with any problems, questions or concerns. No barriers to learning was detected.  Earlie Server, MD 12/18/2021   CHIEF COMPLAINTS/PURPOSE OF CONSULTATION:  Right breast cancer  HISTORY OF PRESENTING  ILLNESS:  [name] 61 y.o. female presents to establish care for right breast cancer I have reviewed her chart and materials related to her cancer extensively and collaborated history with the patient. Summary of oncologic history is as follows: Oncology History  Breast cancer in female [facility])  10/24/2021 Mammogram   Bilateral diagnostic mammogram and targeted ultrasound right breast showed 1. Suspicious right breast mass corresponding with the patient's palpable lump. Recommendation is for ultrasound-guided biopsy. 2. No suspicious right axillary lymphadenopathy. 3. No mammographic evidence of malignancy on the left    11/19/2021 Initial Diagnosis   Breast cancer in female [facility])  -Patient self palpated right breast lump for about 3 months. -11/09/2021, right breast 11:00, 4 cm from nipple, ultrasound-guided biopsy showed invasive mammary carcinoma, ductal NOS.  Grade 2, ER +90%, PR + 90%, HER2 negative [score 1+)   11/19/2021 Cancer Staging   Staging form: Breast, AJCC 8th Edition - Pathologic: Stage IA (pT2, pN0, cM0, G2, ER+, PR+, HER2-, Oncotype DX score: 0) - Signed by Earlie Server, MD on 12/18/2021 Multigene prognostic tests performed: Oncotype DX Recurrence score range: Less than 11 Histologic grading system: 3 grade system   11/26/2021 Surgery   She underwent right lumpectomy with SLNB  Pathology showed  - Invasive ductal carcinoma, 2.2 cm, grade 2  - Resection margins are negative for carcinoma - the anterior margin is less than 0.5 mm from carcinoma, 10 sentinel lymph nodes were excised and all 10 negative for carcinoma. pT2 pN0 ER +90%,  PR + 90%, HER2 negative (1+),      11/26/2021 Oncotype testing   Oncotype Dx recurrence score 0    INTERVAL HISTORY [name] is a 61 y.o. female who has above history reviewed by me today presents for follow up visit for right breast stage Ia breast cancer  During interval, patient is status post right lumpectomy with  sentinel lymph node biopsy by Dr. Ninfa Linden.  Today she reports some soreness at the site of lumpectomy sites.  No drainage.  Patient has post surgery appointment with Dr. Ninfa Linden next week   MEDICAL HISTORY:  Past Medical History:  Diagnosis Date   Allergy    Anal intraepithelial neoplasia II (AIN II)    Arthritis    shoulder, feet   B12 deficiency    Essential hypertension    followed by pcp--- currently no meds,  states when she stopped sinus medication otc , bp better   High frequency hearing loss    History of abnormal Pap smear    History of uterine leiomyoma    Hx of migraine headaches    Iron deficiency anemia    Type 2 diabetes mellitus ([place])    followed by pcp    SURGICAL HISTORY: Past Surgical History:  Procedure Laterality Date   BREAST LUMPECTOMY WITH SENTINEL LYMPH NODE BIOPSY Right 11/26/2021   Procedure: RIGHT BREAST LUMPECTOMY WITH SENTINEL LYMPH NODE BIOPSY;  Surgeon: Coralie Keens, MD;  Location: [place];  Service: General;  Laterality: Right;   BUNIONECTOMY Bilateral 1982   CARDIOVASCULAR STRESS TEST  09-19-2010   @ [place]   normal nuclear study w/ no evidence ishemia/  normal LV funciton and wall motion , ef 81%   COLONOSCOPY WITH ESOPHAGOGASTRODUODENOSCOPY (EGD)  last one 08-21-2017   HIGH RESOLUTION ANOSCOPY N/A 10/09/2017   Procedure: HIGH RESOLUTION ANOSCOPY,  BIOPSY  EXCISION;  Surgeon: Leighton Ruff, MD;  Location: [place];  Service: General;  Laterality: N/A;   LAPAROSCOPIC ASSISTED VAGINAL HYSTERECTOMY  09-18-2005    dr Kennon Rounds  [place]  09-04-2001    dr [employer] [facility]   w/ lysis adhesions   NASAL ENDOSCOPY  03/2007   neg   TONSILLECTOMY  child    SOCIAL HISTORY: Social History   Socioeconomic History   Marital status: Single    Spouse name: Not on file   Number of children: 0   Years of education: Not on file   Highest education level: Not on file  Occupational History   Not on file   Tobacco Use   Smoking status: Never    Passive exposure: Never   Smokeless tobacco: Never  Vaping Use   Vaping Use: Never used  Substance and Sexual Activity   Alcohol use: Yes    Alcohol/week: 2.0 standard drinks of alcohol    Types: 2 Glasses of wine per week   Drug use: No   Sexual activity: Yes    Partners: Male    Birth control/protection: Surgical    Comment: LAVH  Other Topics Concern   Not on file  Social History Narrative   Not on file   Social Determinants of Health   Financial Resource Strain: Not on file  Food Insecurity: Not on file  Transportation Needs: Not on file  Physical Activity: Not on file  Stress: Not on file  Social Connections: Not on file  Intimate Partner Violence: Not on file    FAMILY HISTORY: Family History  Problem Relation Age of Onset   Rectal cancer Father    Colon cancer Father    Heart disease Sister    Arthritis Sister    Diabetes Sister    Hypertension Sister    Diabetes Maternal Grandmother    Hypertension Maternal Grandmother    Sudden death Daughter        after giving birth / ? if blood clot   Breast cancer Neg Hx    Esophageal cancer Neg Hx    Stomach cancer Neg Hx     ALLERGIES:  is allergic to lisinopril, losartan, and amoxicillin-pot clavulanate.  MEDICATIONS:  Current Outpatient Medications  Medication Sig Dispense Refill   amLODipine (NORVASC) 5 MG tablet TAKE 1 TABLET (5 MG TOTAL) BY MOUTH DAILY. 90 tablet 0   loratadine (CLARITIN) 10 MG tablet Take 10 mg by mouth daily as needed for allergies.     metFORMIN (GLUCOPHAGE) 1000 MG tablet TAKE 1 TABLET (1,000 MG TOTAL) BY MOUTH TWICE A DAY WITH FOOD 180 tablet 0   diclofenac sodium (VOLTAREN) 1 % GEL Apply 4 g topically 4 (four) times daily. (Patient not taking: Reported on 12/18/2021) 100 g 2   traMADol (ULTRAM) 50 MG tablet Take 1 tablet (50 mg total) by mouth every 6 (six) hours as needed. (Patient not taking: Reported on 12/18/2021) 30 tablet 0   No current  facility-administered medications for this visit.    Review of Systems  Constitutional:  Negative for appetite change, chills, fatigue and fever.  HENT:   Negative for hearing loss and voice change.   Eyes:  Negative for eye problems.  Respiratory:  Negative for chest tightness and cough.   Cardiovascular:  Negative for chest pain.  Gastrointestinal:  Negative for abdominal distention, abdominal pain and blood in stool.  Endocrine: Negative for hot flashes.  Genitourinary:  Negative for difficulty urinating and frequency.   Musculoskeletal:  Negative for arthralgias.  Skin:  Negative for itching and rash.  Neurological:  Negative for extremity weakness.  Hematological:  Negative for adenopathy.  Psychiatric/Behavioral:  Negative for confusion.    Right breast mass.  PHYSICAL EXAMINATION: ECOG PERFORMANCE STATUS: 0 - Asymptomatic  Vitals:   12/18/21 0927 12/18/21 0931  BP:  136/89  Pulse:  78  Resp: 18 18  SpO2:  100%   Filed Weights   12/18/21 0927  Weight: 185 lb (83.9 kg)    Physical Exam Constitutional:      Appearance: Normal appearance.  HENT:     Head: Normocephalic and atraumatic.     Nose: Nose normal.     Mouth/Throat:     Pharynx: No oropharyngeal exudate.  Eyes:     General: No scleral icterus.    Pupils: Pupils are equal, round, and reactive to light.  Cardiovascular:     Rate and Rhythm: Normal rate.     Heart sounds: No murmur heard. Pulmonary:     Effort: Pulmonary effort is normal. No respiratory distress.  Abdominal:     General: There is no distension.  Musculoskeletal:        General: Normal range of motion.     Cervical back: Normal range of motion and neck supple.  Skin:    Findings: No rash.  Neurological:     Mental Status: She is alert and oriented to person, place, and time. Mental status is at baseline.     Cranial Nerves: No cranial nerve deficit.     Motor: No abnormal muscle tone.  Psychiatric:  Mood and Affect: Mood  and affect normal.       LABORATORY DATA:  I have reviewed the data as listed Lab Results  Component Value Date   WBC 4.0 11/19/2021   HGB 12.3 11/19/2021   HCT 37.7 11/19/2021   MCV 80.9 11/19/2021   PLT 150 11/19/2021   Recent Labs    10/09/21 0919 11/19/21 1157  NA 141 137  K 4.5 3.9  CL 105 105  CO2 28 23  GLUCOSE 157* 122*  BUN 14 14  CREATININE 0.68 0.69  CALCIUM 9.5 9.1  GFRNONAA  --  >60  PROT 7.0 7.5  ALBUMIN 4.4 4.0  AST 16 23  ALT 12 15  ALKPHOS 77 73  BILITOT 0.3 0.5     RADIOGRAPHIC STUDIES: I have personally reviewed the radiological images as listed and agreed with the findings in the report. No results found.

## 2021-12-18 NOTE — Assessment & Plan Note (Addendum)
Right stage IA breast cancer, ER/PR positive, HER2 negative. Pathology results were reviewed and discussed with patient. Oncotype DX recurrence score 0.  No benefit of adjuvant chemotherapy. Refer to radiation oncology for adjuvant radiation. Patient is postmenopausal.  Recommend adjuvant endocrine therapy after she finishes radiation Check baseline bone density.

## 2021-12-18 NOTE — Assessment & Plan Note (Signed)
Discussed with patient

## 2021-12-21 ENCOUNTER — Encounter (HOSPITAL_COMMUNITY): Payer: Self-pay

## 2021-12-27 ENCOUNTER — Ambulatory Visit: Payer: BC Managed Care – PPO | Admitting: Radiation Oncology

## 2022-01-10 ENCOUNTER — Encounter: Payer: Self-pay | Admitting: Radiation Oncology

## 2022-01-10 ENCOUNTER — Institutional Professional Consult (permissible substitution): Payer: BC Managed Care – PPO | Admitting: Radiation Oncology

## 2022-01-10 ENCOUNTER — Ambulatory Visit
Admission: RE | Admit: 2022-01-10 | Discharge: 2022-01-10 | Disposition: A | Discharge status: Home / Self Care | Payer: BC Managed Care – PPO | Source: Ambulatory Visit | Attending: Radiation Oncology | Admitting: Radiation Oncology

## 2022-01-10 VITALS — BP 148/91 | HR 75 | Temp 98.0°F | Resp 16 | Ht 63.0 in | Wt 189.6 lb

## 2022-01-10 DIAGNOSIS — Z170 Estrogen receptor positive status [ER+]: Secondary | ICD-10-CM | POA: Diagnosis not present

## 2022-01-10 DIAGNOSIS — I10 Essential (primary) hypertension: Secondary | ICD-10-CM | POA: Insufficient documentation

## 2022-01-10 DIAGNOSIS — C50411 Malignant neoplasm of upper-outer quadrant of right female breast: Secondary | ICD-10-CM | POA: Insufficient documentation

## 2022-01-10 DIAGNOSIS — E538 Deficiency of other specified B group vitamins: Secondary | ICD-10-CM | POA: Insufficient documentation

## 2022-01-10 DIAGNOSIS — Z7984 Long term (current) use of oral hypoglycemic drugs: Secondary | ICD-10-CM | POA: Diagnosis not present

## 2022-01-10 DIAGNOSIS — C50211 Malignant neoplasm of upper-inner quadrant of right female breast: Secondary | ICD-10-CM | POA: Diagnosis not present

## 2022-01-10 DIAGNOSIS — M129 Arthropathy, unspecified: Secondary | ICD-10-CM | POA: Insufficient documentation

## 2022-01-10 DIAGNOSIS — D509 Iron deficiency anemia, unspecified: Secondary | ICD-10-CM | POA: Diagnosis not present

## 2022-01-10 DIAGNOSIS — E119 Type 2 diabetes mellitus without complications: Secondary | ICD-10-CM | POA: Diagnosis not present

## 2022-01-10 DIAGNOSIS — K6282 Dysplasia of anus: Secondary | ICD-10-CM | POA: Diagnosis not present

## 2022-01-10 NOTE — Consult Note (Signed)
NEW PATIENT EVALUATION  Name: [name]  MRN: [id number]  Date:   01/10/2022     DOB: [birth year]   This 61 y.o. female patient presents to the clinic for initial evaluation of stage Ia (pT2 N0 M0) ER/PR positive HER2 negative invasive mammary carcinoma.  Right breast status post wide local excision and sentinel node biopsy  REFERRING PHYSICIAN: Tower, Wynelle Fanny, MD  CHIEF COMPLAINT: No chief complaint on file.   DIAGNOSIS: The encounter diagnosis was Malignant neoplasm of upper-inner quadrant of right breast in female, estrogen receptor positive ([place]).   PREVIOUS INVESTIGATIONS: Mammograms reviewed Clinical notes reviewed Pathology report reviewed   HPI: Patient is a 61 year old female who presents with a palpable mass in her right breast.  This was confirmed on mammogram to suspicious right breast mass corresponding to the patient's palpable lump.  She underwent ultrasound-guided biopsy.  There was no ultrasound evidence of right axillary adenopathy.  Biopsy was positive for invasive mammary carcinoma.  She went on to have a wide local excision and sentinel lymph node biopsy showing a 2.2 cm invasive mammary carcinoma ER/PR positive HER2 not overexpressed.  Margins were clear at less than 0.5 mm.  Oncotype DX showed low risk of recurrence systemic chemotherapy will be offered.  She is seen today for opinion and she is doing well.  She specifically denies breast tenderness cough or bone pain.  PLANNED TREATMENT REGIMEN: Right whole breast radiation  PAST MEDICAL HISTORY:  has a past medical history of Allergy, Anal intraepithelial neoplasia II (AIN II), Arthritis, B12 deficiency, Essential hypertension, High frequency hearing loss, History of abnormal Pap smear, History of uterine leiomyoma, migraine headaches, Iron deficiency anemia, and Type 2 diabetes mellitus ([place]).    PAST SURGICAL HISTORY:  Past Surgical History:  Procedure Laterality Date   BREAST LUMPECTOMY WITH  SENTINEL LYMPH NODE BIOPSY Right 11/26/2021   Procedure: RIGHT BREAST LUMPECTOMY WITH SENTINEL LYMPH NODE BIOPSY;  Surgeon: Coralie Keens, MD;  Location: [place];  Service: General;  Laterality: Right;   BUNIONECTOMY Bilateral 1982   CARDIOVASCULAR STRESS TEST  09-19-2010   @ [place]   normal nuclear study w/ no evidence ishemia/  normal LV funciton and wall motion , ef 81%   COLONOSCOPY WITH ESOPHAGOGASTRODUODENOSCOPY (EGD)  last one 08-21-2017   HIGH RESOLUTION ANOSCOPY N/A 10/09/2017   Procedure: HIGH RESOLUTION ANOSCOPY,  BIOPSY  EXCISION;  Surgeon: Leighton Ruff, MD;  Location: [place];  Service: General;  Laterality: N/A;   LAPAROSCOPIC ASSISTED VAGINAL HYSTERECTOMY  09-18-2005    dr Kennon Rounds  [place]  09-04-2001    dr [employer] [facility]   w/ lysis adhesions   NASAL ENDOSCOPY  03/2007   neg   TONSILLECTOMY  child    FAMILY HISTORY: family history includes Arthritis in her sister; Colon cancer in her father; Diabetes in her maternal grandmother and sister; Heart disease in her sister; Hypertension in her maternal grandmother and sister; Rectal cancer in her father; Sudden death in her daughter.  SOCIAL HISTORY:  reports that she has never smoked. She has never been exposed to tobacco smoke. She has never used smokeless tobacco. She reports current alcohol use of about 2.0 standard drinks of alcohol per week. She reports that she does not use drugs.  ALLERGIES: Lisinopril, Losartan, and Amoxicillin-pot clavulanate  MEDICATIONS:  Current Outpatient Medications  Medication Sig Dispense Refill   amLODipine (NORVASC) 5 MG tablet TAKE 1 TABLET (5 MG TOTAL) BY  MOUTH DAILY. 90 tablet 0   diclofenac sodium (VOLTAREN) 1 % GEL Apply 4 g topically 4 (four) times daily. (Patient not taking: Reported on 12/18/2021) 100 g 2   loratadine (CLARITIN) 10 MG tablet Take 10 mg by mouth daily as needed for allergies.     metFORMIN (GLUCOPHAGE) 1000 MG tablet TAKE 1  TABLET (1,000 MG TOTAL) BY MOUTH TWICE A DAY WITH FOOD 180 tablet 0   traMADol (ULTRAM) 50 MG tablet Take 1 tablet (50 mg total) by mouth every 6 (six) hours as needed. (Patient not taking: Reported on 12/18/2021) 30 tablet 0   No current facility-administered medications for this encounter.    ECOG PERFORMANCE STATUS:  0 - Asymptomatic  REVIEW OF SYSTEMS: Patient denies any weight loss, fatigue, weakness, fever, chills or night sweats. Patient denies any loss of vision, blurred vision. Patient denies any ringing  of the ears or hearing loss. No irregular heartbeat. Patient denies heart murmur or history of fainting. Patient denies any chest pain or pain radiating to her upper extremities. Patient denies any shortness of breath, difficulty breathing at night, cough or hemoptysis. Patient denies any swelling in the lower legs. Patient denies any nausea vomiting, vomiting of blood, or coffee ground material in the vomitus. Patient denies any stomach pain. Patient states has had normal bowel movements no significant constipation or diarrhea. Patient denies any dysuria, hematuria or significant nocturia. Patient denies any problems walking, swelling in the joints or loss of balance. Patient denies any skin changes, loss of hair or loss of weight. Patient denies any excessive worrying or anxiety or significant depression. Patient denies any problems with insomnia. Patient denies excessive thirst, polyuria, polydipsia. Patient denies any swollen glands, patient denies easy bruising or easy bleeding. Patient denies any recent infections, allergies or URI. Patient "s visual fields have not changed significantly in recent time.   PHYSICAL EXAM: There were no vitals taken for this visit.  Patient has large pendulous breast making hypofractionated course of treatment difficult She status post wide local excision of the right breast which is healing well.  Patient does have a significant seroma around the area of  incision.  She is also having some cording in her right upper extremity.  No dominant masses noted in either breast.  No axillary or supraclavicular adenopathy is identified.  Well-developed well-nourished patient in NAD. HEENT reveals PERLA, EOMI, discs not visualized.  Oral cavity is clear. No oral mucosal lesions are identified. Neck is clear without evidence of cervical or supraclavicular adenopathy. Lungs are clear to A&P. Cardiac examination is essentially unremarkable with regular rate and rhythm without murmur rub or thrill. Abdomen is benign with no organomegaly or masses noted. Motor sensory and DTR levels are equal and symmetric in the upper and lower extremities. Cranial nerves II through XII are grossly intact. Proprioception is intact. No peripheral adenopathy or edema is identified. No motor or sensory levels are noted. Crude visual fields are within normal range.  LABORATORY DATA: Pathology reports reviewed    RADIOLOGY RESULTS: Mammogram and ultrasound reviewed compatible with above-stated findings   IMPRESSION: Stage Ia invasive mammary carcinoma the right breast status post wide local excision and sentinel node biopsy in 61 year old female ER/PR positive  PLAN: Present time I have recommended right whole breast radiation.  Based on the large volume of her breast we treat over 50.4 Gray over 28 fractions boosting her scar another 1600 centigrade based on less than 0.5 mm margin.  Risk and benefits of treatment occluding  skin reaction fatigue alteration of blood counts possible inclusion of superficial lung all were discussed in detail.  Patient comprehends my recommendations well I personally set up and ordered CT simulation for later next week.  She also will benefit from endocrine therapy after completion of radiation.  I would like to take this opportunity to thank you for allowing me to participate in the care of your patient.Noreene Filbert, MD

## 2022-01-14 DIAGNOSIS — Z170 Estrogen receptor positive status [ER+]: Secondary | ICD-10-CM | POA: Diagnosis not present

## 2022-01-14 DIAGNOSIS — C50211 Malignant neoplasm of upper-inner quadrant of right female breast: Secondary | ICD-10-CM | POA: Diagnosis not present

## 2022-01-17 ENCOUNTER — Ambulatory Visit
Admission: RE | Admit: 2022-01-17 | Discharge: 2022-01-17 | Disposition: A | Discharge status: Home / Self Care | Payer: BC Managed Care – PPO | Source: Ambulatory Visit | Attending: Radiation Oncology | Admitting: Radiation Oncology

## 2022-01-17 DIAGNOSIS — Z510 Encounter for antineoplastic radiation therapy: Secondary | ICD-10-CM | POA: Diagnosis not present

## 2022-01-17 DIAGNOSIS — C50211 Malignant neoplasm of upper-inner quadrant of right female breast: Secondary | ICD-10-CM | POA: Insufficient documentation

## 2022-01-17 DIAGNOSIS — Z170 Estrogen receptor positive status [ER+]: Secondary | ICD-10-CM | POA: Diagnosis not present

## 2022-01-18 ENCOUNTER — Other Ambulatory Visit: Payer: Self-pay | Admitting: *Deleted

## 2022-01-18 DIAGNOSIS — Z170 Estrogen receptor positive status [ER+]: Secondary | ICD-10-CM

## 2022-01-21 ENCOUNTER — Encounter: Payer: Self-pay | Admitting: *Deleted

## 2022-01-21 ENCOUNTER — Telehealth: Payer: Self-pay | Admitting: *Deleted

## 2022-01-21 NOTE — Telephone Encounter (Signed)
Form completed, signed and faxed back to [employer]

## 2022-01-21 NOTE — Telephone Encounter (Signed)
FMLA Form received by fax from [employer]. Called patient for information as to continuous or intermittent leave is being requested, she states this is for intermittent Leave

## 2022-01-22 DIAGNOSIS — Z510 Encounter for antineoplastic radiation therapy: Secondary | ICD-10-CM | POA: Diagnosis not present

## 2022-01-22 DIAGNOSIS — C50211 Malignant neoplasm of upper-inner quadrant of right female breast: Secondary | ICD-10-CM | POA: Diagnosis not present

## 2022-01-22 DIAGNOSIS — Z170 Estrogen receptor positive status [ER+]: Secondary | ICD-10-CM | POA: Diagnosis not present

## 2022-01-24 ENCOUNTER — Ambulatory Visit
Admission: RE | Admit: 2022-01-24 | Discharge: 2022-01-24 | Disposition: A | Discharge status: Home / Self Care | Payer: BC Managed Care – PPO | Source: Ambulatory Visit | Attending: Radiation Oncology | Admitting: Radiation Oncology

## 2022-01-24 DIAGNOSIS — Z170 Estrogen receptor positive status [ER+]: Secondary | ICD-10-CM | POA: Diagnosis not present

## 2022-01-24 DIAGNOSIS — Z510 Encounter for antineoplastic radiation therapy: Secondary | ICD-10-CM | POA: Diagnosis not present

## 2022-01-24 DIAGNOSIS — C50211 Malignant neoplasm of upper-inner quadrant of right female breast: Secondary | ICD-10-CM | POA: Diagnosis not present

## 2022-01-28 ENCOUNTER — Other Ambulatory Visit: Payer: Self-pay

## 2022-01-28 ENCOUNTER — Other Ambulatory Visit: Payer: Self-pay | Admitting: Family Medicine

## 2022-01-28 ENCOUNTER — Ambulatory Visit
Admission: RE | Admit: 2022-01-28 | Discharge: 2022-01-28 | Disposition: A | Discharge status: Home / Self Care | Payer: BC Managed Care – PPO | Source: Ambulatory Visit | Attending: Radiation Oncology | Admitting: Radiation Oncology

## 2022-01-28 DIAGNOSIS — C50211 Malignant neoplasm of upper-inner quadrant of right female breast: Secondary | ICD-10-CM | POA: Diagnosis not present

## 2022-01-28 DIAGNOSIS — Z510 Encounter for antineoplastic radiation therapy: Secondary | ICD-10-CM | POA: Diagnosis not present

## 2022-01-28 DIAGNOSIS — Z170 Estrogen receptor positive status [ER+]: Secondary | ICD-10-CM | POA: Diagnosis not present

## 2022-01-28 LAB — RAD ONC ARIA SESSION SUMMARY
Course Elapsed Days: 0
Plan Fractions Treated to Date: 1
Plan Prescribed Dose Per Fraction: 1.8 Gy
Plan Total Fractions Prescribed: 28
Plan Total Prescribed Dose: 50.4 Gy
Reference Point Dosage Given to Date: 1.8 Gy
Reference Point Session Dosage Given: 1.8 Gy
Session Number: 1

## 2022-01-29 ENCOUNTER — Other Ambulatory Visit: Payer: BC Managed Care – PPO

## 2022-01-29 ENCOUNTER — Ambulatory Visit
Admission: RE | Admit: 2022-01-29 | Discharge: 2022-01-29 | Disposition: A | Discharge status: Home / Self Care | Payer: BC Managed Care – PPO | Source: Ambulatory Visit | Attending: Radiation Oncology | Admitting: Radiation Oncology

## 2022-01-29 ENCOUNTER — Telehealth: Payer: Self-pay

## 2022-01-29 ENCOUNTER — Other Ambulatory Visit: Payer: Self-pay

## 2022-01-29 DIAGNOSIS — Z170 Estrogen receptor positive status [ER+]: Secondary | ICD-10-CM | POA: Diagnosis not present

## 2022-01-29 DIAGNOSIS — Z510 Encounter for antineoplastic radiation therapy: Secondary | ICD-10-CM | POA: Diagnosis not present

## 2022-01-29 DIAGNOSIS — C50211 Malignant neoplasm of upper-inner quadrant of right female breast: Secondary | ICD-10-CM | POA: Diagnosis not present

## 2022-01-29 LAB — RAD ONC ARIA SESSION SUMMARY
Course Elapsed Days: 1
Plan Fractions Treated to Date: 2
Plan Prescribed Dose Per Fraction: 1.8 Gy
Plan Total Fractions Prescribed: 28
Plan Total Prescribed Dose: 50.4 Gy
Reference Point Dosage Given to Date: 3.6 Gy
Reference Point Session Dosage Given: 1.8 Gy
Session Number: 2

## 2022-01-29 NOTE — Telephone Encounter (Signed)
Final radiation treatment scheduled for 12/20. Please schedule MD only 2 weeks after that and inform pt of appt.

## 2022-01-30 ENCOUNTER — Other Ambulatory Visit: Payer: Self-pay

## 2022-01-30 ENCOUNTER — Ambulatory Visit
Admission: RE | Admit: 2022-01-30 | Discharge: 2022-01-30 | Disposition: A | Discharge status: Home / Self Care | Payer: BC Managed Care – PPO | Source: Ambulatory Visit | Attending: Radiation Oncology | Admitting: Radiation Oncology

## 2022-01-30 DIAGNOSIS — C50211 Malignant neoplasm of upper-inner quadrant of right female breast: Secondary | ICD-10-CM | POA: Insufficient documentation

## 2022-01-30 DIAGNOSIS — Z170 Estrogen receptor positive status [ER+]: Secondary | ICD-10-CM | POA: Diagnosis not present

## 2022-01-30 DIAGNOSIS — Z510 Encounter for antineoplastic radiation therapy: Secondary | ICD-10-CM | POA: Insufficient documentation

## 2022-01-30 LAB — RAD ONC ARIA SESSION SUMMARY
Course Elapsed Days: 2
Plan Fractions Treated to Date: 3
Plan Prescribed Dose Per Fraction: 1.8 Gy
Plan Total Fractions Prescribed: 28
Plan Total Prescribed Dose: 50.4 Gy
Reference Point Dosage Given to Date: 5.4 Gy
Reference Point Session Dosage Given: 1.8 Gy
Session Number: 3

## 2022-01-31 ENCOUNTER — Ambulatory Visit
Admission: RE | Admit: 2022-01-31 | Discharge: 2022-01-31 | Disposition: A | Discharge status: Home / Self Care | Payer: BC Managed Care – PPO | Source: Ambulatory Visit | Attending: Radiation Oncology | Admitting: Radiation Oncology

## 2022-01-31 ENCOUNTER — Other Ambulatory Visit: Payer: Self-pay

## 2022-01-31 ENCOUNTER — Inpatient Hospital Stay: Payer: BC Managed Care – PPO

## 2022-01-31 DIAGNOSIS — C50411 Malignant neoplasm of upper-outer quadrant of right female breast: Secondary | ICD-10-CM | POA: Insufficient documentation

## 2022-01-31 DIAGNOSIS — C50211 Malignant neoplasm of upper-inner quadrant of right female breast: Secondary | ICD-10-CM | POA: Diagnosis not present

## 2022-01-31 DIAGNOSIS — Z170 Estrogen receptor positive status [ER+]: Secondary | ICD-10-CM

## 2022-01-31 DIAGNOSIS — Z510 Encounter for antineoplastic radiation therapy: Secondary | ICD-10-CM | POA: Diagnosis not present

## 2022-01-31 LAB — RAD ONC ARIA SESSION SUMMARY
Course Elapsed Days: 3
Plan Fractions Treated to Date: 4
Plan Prescribed Dose Per Fraction: 1.8 Gy
Plan Total Fractions Prescribed: 28
Plan Total Prescribed Dose: 50.4 Gy
Reference Point Dosage Given to Date: 7.2 Gy
Reference Point Session Dosage Given: 1.8 Gy
Session Number: 4

## 2022-01-31 LAB — CBC
HCT: 36.6 % (ref 36.0–46.0)
Hemoglobin: 11.7 g/dL — ABNORMAL LOW (ref 12.0–15.0)
MCH: 26.2 pg (ref 26.0–34.0)
MCHC: 32 g/dL (ref 30.0–36.0)
MCV: 81.9 fL (ref 80.0–100.0)
Platelets: 159 10*3/uL (ref 150–400)
RBC: 4.47 MIL/uL (ref 3.87–5.11)
RDW: 14.7 % (ref 11.5–15.5)
WBC: 4 10*3/uL (ref 4.0–10.5)
nRBC: 0 % (ref 0.0–0.2)

## 2022-02-01 ENCOUNTER — Other Ambulatory Visit: Payer: Self-pay

## 2022-02-01 ENCOUNTER — Ambulatory Visit
Admission: RE | Admit: 2022-02-01 | Discharge: 2022-02-01 | Disposition: A | Discharge status: Home / Self Care | Payer: BC Managed Care – PPO | Source: Ambulatory Visit | Attending: Radiation Oncology | Admitting: Radiation Oncology

## 2022-02-01 DIAGNOSIS — Z170 Estrogen receptor positive status [ER+]: Secondary | ICD-10-CM | POA: Diagnosis not present

## 2022-02-01 DIAGNOSIS — Z510 Encounter for antineoplastic radiation therapy: Secondary | ICD-10-CM | POA: Diagnosis not present

## 2022-02-01 DIAGNOSIS — C50211 Malignant neoplasm of upper-inner quadrant of right female breast: Secondary | ICD-10-CM | POA: Diagnosis not present

## 2022-02-01 LAB — RAD ONC ARIA SESSION SUMMARY
Course Elapsed Days: 4
Plan Fractions Treated to Date: 5
Plan Prescribed Dose Per Fraction: 1.8 Gy
Plan Total Fractions Prescribed: 28
Plan Total Prescribed Dose: 50.4 Gy
Reference Point Dosage Given to Date: 9 Gy
Reference Point Session Dosage Given: 1.8 Gy
Session Number: 5

## 2022-02-04 ENCOUNTER — Other Ambulatory Visit: Payer: Self-pay

## 2022-02-04 ENCOUNTER — Ambulatory Visit
Admission: RE | Admit: 2022-02-04 | Discharge: 2022-02-04 | Disposition: A | Discharge status: Home / Self Care | Payer: BC Managed Care – PPO | Source: Ambulatory Visit | Attending: Radiation Oncology | Admitting: Radiation Oncology

## 2022-02-04 DIAGNOSIS — Z170 Estrogen receptor positive status [ER+]: Secondary | ICD-10-CM | POA: Diagnosis not present

## 2022-02-04 DIAGNOSIS — Z510 Encounter for antineoplastic radiation therapy: Secondary | ICD-10-CM | POA: Diagnosis not present

## 2022-02-04 DIAGNOSIS — C50211 Malignant neoplasm of upper-inner quadrant of right female breast: Secondary | ICD-10-CM | POA: Diagnosis not present

## 2022-02-04 LAB — RAD ONC ARIA SESSION SUMMARY
Course Elapsed Days: 7
Plan Fractions Treated to Date: 6
Plan Prescribed Dose Per Fraction: 1.8 Gy
Plan Total Fractions Prescribed: 28
Plan Total Prescribed Dose: 50.4 Gy
Reference Point Dosage Given to Date: 10.8 Gy
Reference Point Session Dosage Given: 1.8 Gy
Session Number: 6

## 2022-02-05 ENCOUNTER — Ambulatory Visit
Admission: RE | Admit: 2022-02-05 | Discharge: 2022-02-05 | Disposition: A | Discharge status: Home / Self Care | Payer: BC Managed Care – PPO | Source: Ambulatory Visit | Attending: Radiation Oncology | Admitting: Radiation Oncology

## 2022-02-05 ENCOUNTER — Other Ambulatory Visit: Payer: Self-pay

## 2022-02-05 DIAGNOSIS — Z170 Estrogen receptor positive status [ER+]: Secondary | ICD-10-CM | POA: Diagnosis not present

## 2022-02-05 DIAGNOSIS — Z510 Encounter for antineoplastic radiation therapy: Secondary | ICD-10-CM | POA: Diagnosis not present

## 2022-02-05 DIAGNOSIS — C50211 Malignant neoplasm of upper-inner quadrant of right female breast: Secondary | ICD-10-CM | POA: Diagnosis not present

## 2022-02-05 LAB — RAD ONC ARIA SESSION SUMMARY
Course Elapsed Days: 8
Plan Fractions Treated to Date: 7
Plan Prescribed Dose Per Fraction: 1.8 Gy
Plan Total Fractions Prescribed: 28
Plan Total Prescribed Dose: 50.4 Gy
Reference Point Dosage Given to Date: 12.6 Gy
Reference Point Session Dosage Given: 1.8 Gy
Session Number: 7

## 2022-02-06 ENCOUNTER — Other Ambulatory Visit: Payer: Self-pay

## 2022-02-06 ENCOUNTER — Ambulatory Visit
Admission: RE | Admit: 2022-02-06 | Discharge: 2022-02-06 | Disposition: A | Discharge status: Home / Self Care | Payer: BC Managed Care – PPO | Source: Ambulatory Visit | Attending: Radiation Oncology | Admitting: Radiation Oncology

## 2022-02-06 DIAGNOSIS — C50211 Malignant neoplasm of upper-inner quadrant of right female breast: Secondary | ICD-10-CM | POA: Diagnosis not present

## 2022-02-06 DIAGNOSIS — Z510 Encounter for antineoplastic radiation therapy: Secondary | ICD-10-CM | POA: Diagnosis not present

## 2022-02-06 DIAGNOSIS — Z170 Estrogen receptor positive status [ER+]: Secondary | ICD-10-CM | POA: Diagnosis not present

## 2022-02-06 LAB — RAD ONC ARIA SESSION SUMMARY
Course Elapsed Days: 9
Plan Fractions Treated to Date: 8
Plan Prescribed Dose Per Fraction: 1.8 Gy
Plan Total Fractions Prescribed: 28
Plan Total Prescribed Dose: 50.4 Gy
Reference Point Dosage Given to Date: 14.4 Gy
Reference Point Session Dosage Given: 1.8 Gy
Session Number: 8

## 2022-02-07 ENCOUNTER — Other Ambulatory Visit: Payer: Self-pay

## 2022-02-07 ENCOUNTER — Ambulatory Visit
Admission: RE | Admit: 2022-02-07 | Discharge: 2022-02-07 | Disposition: A | Discharge status: Home / Self Care | Payer: BC Managed Care – PPO | Source: Ambulatory Visit | Attending: Radiation Oncology | Admitting: Radiation Oncology

## 2022-02-07 DIAGNOSIS — C50211 Malignant neoplasm of upper-inner quadrant of right female breast: Secondary | ICD-10-CM | POA: Diagnosis not present

## 2022-02-07 DIAGNOSIS — Z170 Estrogen receptor positive status [ER+]: Secondary | ICD-10-CM | POA: Diagnosis not present

## 2022-02-07 DIAGNOSIS — Z510 Encounter for antineoplastic radiation therapy: Secondary | ICD-10-CM | POA: Diagnosis not present

## 2022-02-07 LAB — RAD ONC ARIA SESSION SUMMARY
Course Elapsed Days: 10
Plan Fractions Treated to Date: 9
Plan Prescribed Dose Per Fraction: 1.8 Gy
Plan Total Fractions Prescribed: 28
Plan Total Prescribed Dose: 50.4 Gy
Reference Point Dosage Given to Date: 16.2 Gy
Reference Point Session Dosage Given: 1.8 Gy
Session Number: 9

## 2022-02-08 ENCOUNTER — Other Ambulatory Visit: Payer: Self-pay

## 2022-02-08 ENCOUNTER — Ambulatory Visit
Admission: RE | Admit: 2022-02-08 | Discharge: 2022-02-08 | Disposition: A | Discharge status: Home / Self Care | Payer: BC Managed Care – PPO | Source: Ambulatory Visit | Attending: Radiation Oncology | Admitting: Radiation Oncology

## 2022-02-08 DIAGNOSIS — Z510 Encounter for antineoplastic radiation therapy: Secondary | ICD-10-CM | POA: Diagnosis not present

## 2022-02-08 DIAGNOSIS — Z170 Estrogen receptor positive status [ER+]: Secondary | ICD-10-CM | POA: Diagnosis not present

## 2022-02-08 DIAGNOSIS — C50211 Malignant neoplasm of upper-inner quadrant of right female breast: Secondary | ICD-10-CM | POA: Diagnosis not present

## 2022-02-08 LAB — RAD ONC ARIA SESSION SUMMARY
Course Elapsed Days: 11
Plan Fractions Treated to Date: 10
Plan Prescribed Dose Per Fraction: 1.8 Gy
Plan Total Fractions Prescribed: 28
Plan Total Prescribed Dose: 50.4 Gy
Reference Point Dosage Given to Date: 18 Gy
Reference Point Session Dosage Given: 1.8 Gy
Session Number: 10

## 2022-02-11 ENCOUNTER — Other Ambulatory Visit: Payer: Self-pay

## 2022-02-11 ENCOUNTER — Ambulatory Visit
Admission: RE | Admit: 2022-02-11 | Discharge: 2022-02-11 | Disposition: A | Discharge status: Home / Self Care | Payer: BC Managed Care – PPO | Source: Ambulatory Visit | Attending: Radiation Oncology | Admitting: Radiation Oncology

## 2022-02-11 DIAGNOSIS — C50211 Malignant neoplasm of upper-inner quadrant of right female breast: Secondary | ICD-10-CM | POA: Diagnosis not present

## 2022-02-11 DIAGNOSIS — Z170 Estrogen receptor positive status [ER+]: Secondary | ICD-10-CM | POA: Diagnosis not present

## 2022-02-11 DIAGNOSIS — Z510 Encounter for antineoplastic radiation therapy: Secondary | ICD-10-CM | POA: Diagnosis not present

## 2022-02-11 LAB — RAD ONC ARIA SESSION SUMMARY
Course Elapsed Days: 14
Plan Fractions Treated to Date: 11
Plan Prescribed Dose Per Fraction: 1.8 Gy
Plan Total Fractions Prescribed: 28
Plan Total Prescribed Dose: 50.4 Gy
Reference Point Dosage Given to Date: 19.8 Gy
Reference Point Session Dosage Given: 1.8 Gy
Session Number: 11

## 2022-02-12 ENCOUNTER — Ambulatory Visit
Admission: RE | Admit: 2022-02-12 | Discharge: 2022-02-12 | Disposition: A | Discharge status: Home / Self Care | Payer: BC Managed Care – PPO | Source: Ambulatory Visit | Attending: Radiation Oncology | Admitting: Radiation Oncology

## 2022-02-12 ENCOUNTER — Other Ambulatory Visit: Payer: Self-pay

## 2022-02-12 DIAGNOSIS — C50211 Malignant neoplasm of upper-inner quadrant of right female breast: Secondary | ICD-10-CM | POA: Diagnosis not present

## 2022-02-12 DIAGNOSIS — Z510 Encounter for antineoplastic radiation therapy: Secondary | ICD-10-CM | POA: Diagnosis not present

## 2022-02-12 DIAGNOSIS — Z170 Estrogen receptor positive status [ER+]: Secondary | ICD-10-CM | POA: Diagnosis not present

## 2022-02-12 LAB — RAD ONC ARIA SESSION SUMMARY
Course Elapsed Days: 15
Plan Fractions Treated to Date: 12
Plan Prescribed Dose Per Fraction: 1.8 Gy
Plan Total Fractions Prescribed: 28
Plan Total Prescribed Dose: 50.4 Gy
Reference Point Dosage Given to Date: 21.6 Gy
Reference Point Session Dosage Given: 1.8 Gy
Session Number: 12

## 2022-02-13 ENCOUNTER — Other Ambulatory Visit: Payer: Self-pay

## 2022-02-13 ENCOUNTER — Ambulatory Visit
Admission: RE | Admit: 2022-02-13 | Discharge: 2022-02-13 | Disposition: A | Discharge status: Home / Self Care | Payer: BC Managed Care – PPO | Source: Ambulatory Visit | Attending: Radiation Oncology | Admitting: Radiation Oncology

## 2022-02-13 DIAGNOSIS — Z510 Encounter for antineoplastic radiation therapy: Secondary | ICD-10-CM | POA: Diagnosis not present

## 2022-02-13 DIAGNOSIS — Z170 Estrogen receptor positive status [ER+]: Secondary | ICD-10-CM | POA: Diagnosis not present

## 2022-02-13 DIAGNOSIS — C50211 Malignant neoplasm of upper-inner quadrant of right female breast: Secondary | ICD-10-CM | POA: Diagnosis not present

## 2022-02-13 LAB — RAD ONC ARIA SESSION SUMMARY
Course Elapsed Days: 16
Plan Fractions Treated to Date: 13
Plan Prescribed Dose Per Fraction: 1.8 Gy
Plan Total Fractions Prescribed: 28
Plan Total Prescribed Dose: 50.4 Gy
Reference Point Dosage Given to Date: 23.4 Gy
Reference Point Session Dosage Given: 1.8 Gy
Session Number: 13

## 2022-02-14 ENCOUNTER — Other Ambulatory Visit: Payer: Self-pay

## 2022-02-14 ENCOUNTER — Inpatient Hospital Stay: Payer: BC Managed Care – PPO

## 2022-02-14 ENCOUNTER — Ambulatory Visit
Admission: RE | Admit: 2022-02-14 | Discharge: 2022-02-14 | Disposition: A | Discharge status: Home / Self Care | Payer: BC Managed Care – PPO | Source: Ambulatory Visit | Attending: Radiation Oncology | Admitting: Radiation Oncology

## 2022-02-14 DIAGNOSIS — Z510 Encounter for antineoplastic radiation therapy: Secondary | ICD-10-CM | POA: Diagnosis not present

## 2022-02-14 DIAGNOSIS — Z170 Estrogen receptor positive status [ER+]: Secondary | ICD-10-CM | POA: Diagnosis not present

## 2022-02-14 DIAGNOSIS — C50211 Malignant neoplasm of upper-inner quadrant of right female breast: Secondary | ICD-10-CM | POA: Diagnosis not present

## 2022-02-14 LAB — RAD ONC ARIA SESSION SUMMARY
Course Elapsed Days: 17
Plan Fractions Treated to Date: 14
Plan Prescribed Dose Per Fraction: 1.8 Gy
Plan Total Fractions Prescribed: 28
Plan Total Prescribed Dose: 50.4 Gy
Reference Point Dosage Given to Date: 25.2 Gy
Reference Point Session Dosage Given: 1.7344 Gy
Session Number: 14

## 2022-02-15 ENCOUNTER — Other Ambulatory Visit: Payer: Self-pay

## 2022-02-15 ENCOUNTER — Ambulatory Visit
Admission: RE | Admit: 2022-02-15 | Discharge: 2022-02-15 | Disposition: A | Discharge status: Home / Self Care | Payer: BC Managed Care – PPO | Source: Ambulatory Visit | Attending: Radiation Oncology | Admitting: Radiation Oncology

## 2022-02-15 DIAGNOSIS — C50211 Malignant neoplasm of upper-inner quadrant of right female breast: Secondary | ICD-10-CM | POA: Diagnosis not present

## 2022-02-15 DIAGNOSIS — Z510 Encounter for antineoplastic radiation therapy: Secondary | ICD-10-CM | POA: Diagnosis not present

## 2022-02-15 DIAGNOSIS — Z170 Estrogen receptor positive status [ER+]: Secondary | ICD-10-CM | POA: Diagnosis not present

## 2022-02-15 LAB — RAD ONC ARIA SESSION SUMMARY
Course Elapsed Days: 18
Plan Fractions Treated to Date: 15
Plan Prescribed Dose Per Fraction: 1.8 Gy
Plan Total Fractions Prescribed: 28
Plan Total Prescribed Dose: 50.4 Gy
Reference Point Dosage Given to Date: 27 Gy
Reference Point Session Dosage Given: 1.8 Gy
Session Number: 15

## 2022-02-18 ENCOUNTER — Ambulatory Visit
Admission: RE | Admit: 2022-02-18 | Discharge: 2022-02-18 | Disposition: A | Discharge status: Home / Self Care | Payer: BC Managed Care – PPO | Source: Ambulatory Visit | Attending: Radiation Oncology | Admitting: Radiation Oncology

## 2022-02-18 ENCOUNTER — Other Ambulatory Visit: Payer: Self-pay

## 2022-02-18 DIAGNOSIS — Z170 Estrogen receptor positive status [ER+]: Secondary | ICD-10-CM | POA: Diagnosis not present

## 2022-02-18 DIAGNOSIS — C50211 Malignant neoplasm of upper-inner quadrant of right female breast: Secondary | ICD-10-CM | POA: Diagnosis not present

## 2022-02-18 DIAGNOSIS — Z510 Encounter for antineoplastic radiation therapy: Secondary | ICD-10-CM | POA: Diagnosis not present

## 2022-02-18 LAB — RAD ONC ARIA SESSION SUMMARY
Course Elapsed Days: 21
Plan Fractions Treated to Date: 16
Plan Prescribed Dose Per Fraction: 1.8 Gy
Plan Total Fractions Prescribed: 28
Plan Total Prescribed Dose: 50.4 Gy
Reference Point Dosage Given to Date: 28.8 Gy
Reference Point Session Dosage Given: 1.8 Gy
Session Number: 16

## 2022-02-19 ENCOUNTER — Ambulatory Visit
Admission: RE | Admit: 2022-02-19 | Discharge: 2022-02-19 | Disposition: A | Discharge status: Home / Self Care | Payer: BC Managed Care – PPO | Source: Ambulatory Visit | Attending: Radiation Oncology | Admitting: Radiation Oncology

## 2022-02-19 ENCOUNTER — Other Ambulatory Visit: Payer: Self-pay

## 2022-02-19 DIAGNOSIS — Z510 Encounter for antineoplastic radiation therapy: Secondary | ICD-10-CM | POA: Diagnosis not present

## 2022-02-19 DIAGNOSIS — Z170 Estrogen receptor positive status [ER+]: Secondary | ICD-10-CM | POA: Diagnosis not present

## 2022-02-19 DIAGNOSIS — C50211 Malignant neoplasm of upper-inner quadrant of right female breast: Secondary | ICD-10-CM | POA: Diagnosis not present

## 2022-02-19 LAB — RAD ONC ARIA SESSION SUMMARY
Course Elapsed Days: 22
Plan Fractions Treated to Date: 17
Plan Prescribed Dose Per Fraction: 1.8 Gy
Plan Total Fractions Prescribed: 28
Plan Total Prescribed Dose: 50.4 Gy
Reference Point Dosage Given to Date: 30.6 Gy
Reference Point Session Dosage Given: 1.8 Gy
Session Number: 17

## 2022-02-20 ENCOUNTER — Ambulatory Visit
Admission: RE | Admit: 2022-02-20 | Discharge: 2022-02-20 | Disposition: A | Discharge status: Home / Self Care | Payer: BC Managed Care – PPO | Source: Ambulatory Visit | Attending: Radiation Oncology | Admitting: Radiation Oncology

## 2022-02-20 ENCOUNTER — Other Ambulatory Visit: Payer: Self-pay

## 2022-02-20 DIAGNOSIS — Z170 Estrogen receptor positive status [ER+]: Secondary | ICD-10-CM | POA: Diagnosis not present

## 2022-02-20 DIAGNOSIS — Z510 Encounter for antineoplastic radiation therapy: Secondary | ICD-10-CM | POA: Diagnosis not present

## 2022-02-20 DIAGNOSIS — C50211 Malignant neoplasm of upper-inner quadrant of right female breast: Secondary | ICD-10-CM | POA: Diagnosis not present

## 2022-02-20 LAB — RAD ONC ARIA SESSION SUMMARY
Course Elapsed Days: 23
Plan Fractions Treated to Date: 18
Plan Prescribed Dose Per Fraction: 1.8 Gy
Plan Total Fractions Prescribed: 28
Plan Total Prescribed Dose: 50.4 Gy
Reference Point Dosage Given to Date: 32.4 Gy
Reference Point Session Dosage Given: 1.8 Gy
Session Number: 18

## 2022-02-25 ENCOUNTER — Other Ambulatory Visit: Payer: Self-pay

## 2022-02-25 ENCOUNTER — Ambulatory Visit
Admission: RE | Admit: 2022-02-25 | Discharge: 2022-02-25 | Disposition: A | Discharge status: Home / Self Care | Payer: BC Managed Care – PPO | Source: Ambulatory Visit | Attending: Radiation Oncology | Admitting: Radiation Oncology

## 2022-02-25 DIAGNOSIS — Z510 Encounter for antineoplastic radiation therapy: Secondary | ICD-10-CM | POA: Diagnosis not present

## 2022-02-25 DIAGNOSIS — Z170 Estrogen receptor positive status [ER+]: Secondary | ICD-10-CM | POA: Diagnosis not present

## 2022-02-25 DIAGNOSIS — C50211 Malignant neoplasm of upper-inner quadrant of right female breast: Secondary | ICD-10-CM | POA: Diagnosis not present

## 2022-02-25 LAB — RAD ONC ARIA SESSION SUMMARY
Course Elapsed Days: 28
Plan Fractions Treated to Date: 19
Plan Prescribed Dose Per Fraction: 1.8 Gy
Plan Total Fractions Prescribed: 28
Plan Total Prescribed Dose: 50.4 Gy
Reference Point Dosage Given to Date: 34.2 Gy
Reference Point Session Dosage Given: 1.8 Gy
Session Number: 19

## 2022-02-26 ENCOUNTER — Ambulatory Visit
Admission: RE | Admit: 2022-02-26 | Discharge: 2022-02-26 | Disposition: A | Discharge status: Home / Self Care | Payer: BC Managed Care – PPO | Source: Ambulatory Visit | Attending: Radiation Oncology | Admitting: Radiation Oncology

## 2022-02-26 ENCOUNTER — Other Ambulatory Visit: Payer: Self-pay

## 2022-02-26 DIAGNOSIS — C50211 Malignant neoplasm of upper-inner quadrant of right female breast: Secondary | ICD-10-CM | POA: Diagnosis not present

## 2022-02-26 DIAGNOSIS — Z510 Encounter for antineoplastic radiation therapy: Secondary | ICD-10-CM | POA: Diagnosis not present

## 2022-02-26 DIAGNOSIS — Z170 Estrogen receptor positive status [ER+]: Secondary | ICD-10-CM | POA: Diagnosis not present

## 2022-02-26 LAB — RAD ONC ARIA SESSION SUMMARY
Course Elapsed Days: 29
Plan Fractions Treated to Date: 20
Plan Prescribed Dose Per Fraction: 1.8 Gy
Plan Total Fractions Prescribed: 28
Plan Total Prescribed Dose: 50.4 Gy
Reference Point Dosage Given to Date: 36 Gy
Reference Point Session Dosage Given: 1.8 Gy
Session Number: 20

## 2022-02-27 ENCOUNTER — Other Ambulatory Visit: Payer: Self-pay

## 2022-02-27 ENCOUNTER — Ambulatory Visit
Admission: RE | Admit: 2022-02-27 | Discharge: 2022-02-27 | Disposition: A | Discharge status: Home / Self Care | Payer: BC Managed Care – PPO | Source: Ambulatory Visit | Attending: Radiation Oncology | Admitting: Radiation Oncology

## 2022-02-27 DIAGNOSIS — C50211 Malignant neoplasm of upper-inner quadrant of right female breast: Secondary | ICD-10-CM | POA: Diagnosis not present

## 2022-02-27 DIAGNOSIS — Z170 Estrogen receptor positive status [ER+]: Secondary | ICD-10-CM | POA: Diagnosis not present

## 2022-02-27 DIAGNOSIS — Z510 Encounter for antineoplastic radiation therapy: Secondary | ICD-10-CM | POA: Diagnosis not present

## 2022-02-27 LAB — RAD ONC ARIA SESSION SUMMARY
Course Elapsed Days: 30
Plan Fractions Treated to Date: 21
Plan Prescribed Dose Per Fraction: 1.8 Gy
Plan Total Fractions Prescribed: 28
Plan Total Prescribed Dose: 50.4 Gy
Reference Point Dosage Given to Date: 37.8 Gy
Reference Point Session Dosage Given: 1.8 Gy
Session Number: 21

## 2022-02-28 ENCOUNTER — Inpatient Hospital Stay: Payer: BC Managed Care – PPO

## 2022-02-28 ENCOUNTER — Other Ambulatory Visit: Payer: Self-pay

## 2022-02-28 ENCOUNTER — Ambulatory Visit
Admission: RE | Admit: 2022-02-28 | Discharge: 2022-02-28 | Disposition: A | Discharge status: Home / Self Care | Payer: BC Managed Care – PPO | Source: Ambulatory Visit | Attending: Radiation Oncology | Admitting: Radiation Oncology

## 2022-02-28 DIAGNOSIS — Z510 Encounter for antineoplastic radiation therapy: Secondary | ICD-10-CM | POA: Diagnosis not present

## 2022-02-28 DIAGNOSIS — C50211 Malignant neoplasm of upper-inner quadrant of right female breast: Secondary | ICD-10-CM | POA: Diagnosis not present

## 2022-02-28 DIAGNOSIS — Z170 Estrogen receptor positive status [ER+]: Secondary | ICD-10-CM | POA: Diagnosis not present

## 2022-02-28 LAB — RAD ONC ARIA SESSION SUMMARY
Course Elapsed Days: 31
Plan Fractions Treated to Date: 22
Plan Prescribed Dose Per Fraction: 1.8 Gy
Plan Total Fractions Prescribed: 28
Plan Total Prescribed Dose: 50.4 Gy
Reference Point Dosage Given to Date: 39.6 Gy
Reference Point Session Dosage Given: 1.8 Gy
Session Number: 22

## 2022-03-01 ENCOUNTER — Other Ambulatory Visit: Payer: Self-pay

## 2022-03-01 ENCOUNTER — Ambulatory Visit
Admission: RE | Admit: 2022-03-01 | Discharge: 2022-03-01 | Disposition: A | Discharge status: Home / Self Care | Payer: BC Managed Care – PPO | Source: Ambulatory Visit | Attending: Radiation Oncology | Admitting: Radiation Oncology

## 2022-03-01 DIAGNOSIS — C50211 Malignant neoplasm of upper-inner quadrant of right female breast: Secondary | ICD-10-CM | POA: Insufficient documentation

## 2022-03-01 DIAGNOSIS — Z510 Encounter for antineoplastic radiation therapy: Secondary | ICD-10-CM | POA: Insufficient documentation

## 2022-03-01 DIAGNOSIS — Z170 Estrogen receptor positive status [ER+]: Secondary | ICD-10-CM | POA: Diagnosis not present

## 2022-03-01 LAB — RAD ONC ARIA SESSION SUMMARY
Course Elapsed Days: 32
Plan Fractions Treated to Date: 23
Plan Prescribed Dose Per Fraction: 1.8 Gy
Plan Total Fractions Prescribed: 28
Plan Total Prescribed Dose: 50.4 Gy
Reference Point Dosage Given to Date: 41.4 Gy
Reference Point Session Dosage Given: 1.8 Gy
Session Number: 23

## 2022-03-04 ENCOUNTER — Other Ambulatory Visit: Payer: Self-pay

## 2022-03-04 ENCOUNTER — Ambulatory Visit
Admission: RE | Admit: 2022-03-04 | Discharge: 2022-03-04 | Disposition: A | Discharge status: Home / Self Care | Payer: BC Managed Care – PPO | Source: Ambulatory Visit | Attending: Radiation Oncology | Admitting: Radiation Oncology

## 2022-03-04 DIAGNOSIS — C50211 Malignant neoplasm of upper-inner quadrant of right female breast: Secondary | ICD-10-CM | POA: Diagnosis not present

## 2022-03-04 DIAGNOSIS — Z510 Encounter for antineoplastic radiation therapy: Secondary | ICD-10-CM | POA: Diagnosis not present

## 2022-03-04 DIAGNOSIS — Z170 Estrogen receptor positive status [ER+]: Secondary | ICD-10-CM | POA: Diagnosis not present

## 2022-03-04 LAB — RAD ONC ARIA SESSION SUMMARY
Course Elapsed Days: 35
Plan Fractions Treated to Date: 24
Plan Prescribed Dose Per Fraction: 1.8 Gy
Plan Total Fractions Prescribed: 28
Plan Total Prescribed Dose: 50.4 Gy
Reference Point Dosage Given to Date: 43.2 Gy
Reference Point Session Dosage Given: 1.8 Gy
Session Number: 24

## 2022-03-05 ENCOUNTER — Other Ambulatory Visit: Payer: Self-pay

## 2022-03-05 ENCOUNTER — Ambulatory Visit
Admission: RE | Admit: 2022-03-05 | Discharge: 2022-03-05 | Disposition: A | Discharge status: Home / Self Care | Payer: BC Managed Care – PPO | Source: Ambulatory Visit | Attending: Radiation Oncology | Admitting: Radiation Oncology

## 2022-03-05 DIAGNOSIS — Z170 Estrogen receptor positive status [ER+]: Secondary | ICD-10-CM | POA: Diagnosis not present

## 2022-03-05 DIAGNOSIS — Z510 Encounter for antineoplastic radiation therapy: Secondary | ICD-10-CM | POA: Diagnosis not present

## 2022-03-05 DIAGNOSIS — C50211 Malignant neoplasm of upper-inner quadrant of right female breast: Secondary | ICD-10-CM | POA: Diagnosis not present

## 2022-03-05 LAB — RAD ONC ARIA SESSION SUMMARY
Course Elapsed Days: 36
Plan Fractions Treated to Date: 25
Plan Prescribed Dose Per Fraction: 1.8 Gy
Plan Total Fractions Prescribed: 28
Plan Total Prescribed Dose: 50.4 Gy
Reference Point Dosage Given to Date: 45 Gy
Reference Point Session Dosage Given: 1.8 Gy
Session Number: 25

## 2022-03-06 ENCOUNTER — Other Ambulatory Visit: Payer: Self-pay

## 2022-03-06 ENCOUNTER — Ambulatory Visit
Admission: RE | Admit: 2022-03-06 | Discharge: 2022-03-06 | Disposition: A | Discharge status: Home / Self Care | Payer: BC Managed Care – PPO | Source: Ambulatory Visit | Attending: Radiation Oncology | Admitting: Radiation Oncology

## 2022-03-06 DIAGNOSIS — C50211 Malignant neoplasm of upper-inner quadrant of right female breast: Secondary | ICD-10-CM | POA: Diagnosis not present

## 2022-03-06 DIAGNOSIS — Z170 Estrogen receptor positive status [ER+]: Secondary | ICD-10-CM | POA: Diagnosis not present

## 2022-03-06 DIAGNOSIS — Z510 Encounter for antineoplastic radiation therapy: Secondary | ICD-10-CM | POA: Diagnosis not present

## 2022-03-06 LAB — RAD ONC ARIA SESSION SUMMARY
Course Elapsed Days: 37
Plan Fractions Treated to Date: 26
Plan Prescribed Dose Per Fraction: 1.8 Gy
Plan Total Fractions Prescribed: 28
Plan Total Prescribed Dose: 50.4 Gy
Reference Point Dosage Given to Date: 46.8 Gy
Reference Point Session Dosage Given: 1.8 Gy
Session Number: 26

## 2022-03-07 ENCOUNTER — Ambulatory Visit
Admission: RE | Admit: 2022-03-07 | Discharge: 2022-03-07 | Disposition: A | Discharge status: Home / Self Care | Payer: BC Managed Care – PPO | Source: Ambulatory Visit | Attending: Radiation Oncology | Admitting: Radiation Oncology

## 2022-03-07 ENCOUNTER — Other Ambulatory Visit: Payer: Self-pay

## 2022-03-07 DIAGNOSIS — C50211 Malignant neoplasm of upper-inner quadrant of right female breast: Secondary | ICD-10-CM | POA: Diagnosis not present

## 2022-03-07 DIAGNOSIS — Z170 Estrogen receptor positive status [ER+]: Secondary | ICD-10-CM | POA: Diagnosis not present

## 2022-03-07 DIAGNOSIS — Z510 Encounter for antineoplastic radiation therapy: Secondary | ICD-10-CM | POA: Diagnosis not present

## 2022-03-07 LAB — RAD ONC ARIA SESSION SUMMARY
Course Elapsed Days: 38
Plan Fractions Treated to Date: 27
Plan Prescribed Dose Per Fraction: 1.8 Gy
Plan Total Fractions Prescribed: 28
Plan Total Prescribed Dose: 50.4 Gy
Reference Point Dosage Given to Date: 48.6 Gy
Reference Point Session Dosage Given: 1.8 Gy
Session Number: 27

## 2022-03-08 ENCOUNTER — Ambulatory Visit: Payer: BC Managed Care – PPO

## 2022-03-08 ENCOUNTER — Ambulatory Visit
Admission: RE | Admit: 2022-03-08 | Discharge: 2022-03-08 | Disposition: A | Discharge status: Home / Self Care | Payer: BC Managed Care – PPO | Source: Ambulatory Visit | Attending: Radiation Oncology | Admitting: Radiation Oncology

## 2022-03-08 ENCOUNTER — Other Ambulatory Visit: Payer: Self-pay

## 2022-03-08 DIAGNOSIS — C50211 Malignant neoplasm of upper-inner quadrant of right female breast: Secondary | ICD-10-CM | POA: Diagnosis not present

## 2022-03-08 DIAGNOSIS — Z510 Encounter for antineoplastic radiation therapy: Secondary | ICD-10-CM | POA: Diagnosis not present

## 2022-03-08 DIAGNOSIS — Z170 Estrogen receptor positive status [ER+]: Secondary | ICD-10-CM | POA: Diagnosis not present

## 2022-03-08 LAB — RAD ONC ARIA SESSION SUMMARY
Course Elapsed Days: 39
Plan Fractions Treated to Date: 28
Plan Prescribed Dose Per Fraction: 1.8 Gy
Plan Total Fractions Prescribed: 28
Plan Total Prescribed Dose: 50.4 Gy
Reference Point Dosage Given to Date: 50.4 Gy
Reference Point Session Dosage Given: 1.8 Gy
Session Number: 28

## 2022-03-11 ENCOUNTER — Other Ambulatory Visit: Payer: Self-pay

## 2022-03-11 ENCOUNTER — Ambulatory Visit
Admission: RE | Admit: 2022-03-11 | Discharge: 2022-03-11 | Disposition: A | Discharge status: Home / Self Care | Payer: BC Managed Care – PPO | Source: Ambulatory Visit | Attending: Radiation Oncology | Admitting: Radiation Oncology

## 2022-03-11 DIAGNOSIS — Z510 Encounter for antineoplastic radiation therapy: Secondary | ICD-10-CM | POA: Diagnosis not present

## 2022-03-11 DIAGNOSIS — C50211 Malignant neoplasm of upper-inner quadrant of right female breast: Secondary | ICD-10-CM | POA: Diagnosis not present

## 2022-03-11 LAB — RAD ONC ARIA SESSION SUMMARY
Course Elapsed Days: 42
Plan Fractions Treated to Date: 1
Plan Prescribed Dose Per Fraction: 2 Gy
Plan Total Fractions Prescribed: 8
Plan Total Prescribed Dose: 16 Gy
Reference Point Dosage Given to Date: 2 Gy
Reference Point Session Dosage Given: 2 Gy
Session Number: 29

## 2022-03-12 ENCOUNTER — Ambulatory Visit
Admission: RE | Admit: 2022-03-12 | Discharge: 2022-03-12 | Disposition: A | Discharge status: Home / Self Care | Payer: BC Managed Care – PPO | Source: Ambulatory Visit | Attending: Radiation Oncology | Admitting: Radiation Oncology

## 2022-03-12 ENCOUNTER — Other Ambulatory Visit: Payer: Self-pay

## 2022-03-12 ENCOUNTER — Ambulatory Visit: Payer: BC Managed Care – PPO

## 2022-03-12 DIAGNOSIS — C50211 Malignant neoplasm of upper-inner quadrant of right female breast: Secondary | ICD-10-CM | POA: Diagnosis not present

## 2022-03-12 DIAGNOSIS — Z510 Encounter for antineoplastic radiation therapy: Secondary | ICD-10-CM | POA: Diagnosis not present

## 2022-03-12 DIAGNOSIS — Z170 Estrogen receptor positive status [ER+]: Secondary | ICD-10-CM | POA: Diagnosis not present

## 2022-03-12 LAB — RAD ONC ARIA SESSION SUMMARY
Course Elapsed Days: 43
Plan Fractions Treated to Date: 2
Plan Prescribed Dose Per Fraction: 2 Gy
Plan Total Fractions Prescribed: 8
Plan Total Prescribed Dose: 16 Gy
Reference Point Dosage Given to Date: 4 Gy
Reference Point Session Dosage Given: 2 Gy
Session Number: 30

## 2022-03-13 ENCOUNTER — Ambulatory Visit
Admission: RE | Admit: 2022-03-13 | Discharge: 2022-03-13 | Disposition: A | Discharge status: Home / Self Care | Payer: BC Managed Care – PPO | Source: Ambulatory Visit | Attending: Radiation Oncology | Admitting: Radiation Oncology

## 2022-03-13 ENCOUNTER — Other Ambulatory Visit: Payer: Self-pay

## 2022-03-13 DIAGNOSIS — Z510 Encounter for antineoplastic radiation therapy: Secondary | ICD-10-CM | POA: Diagnosis not present

## 2022-03-13 DIAGNOSIS — C50211 Malignant neoplasm of upper-inner quadrant of right female breast: Secondary | ICD-10-CM | POA: Diagnosis not present

## 2022-03-13 LAB — RAD ONC ARIA SESSION SUMMARY
Course Elapsed Days: 44
Plan Fractions Treated to Date: 3
Plan Prescribed Dose Per Fraction: 2 Gy
Plan Total Fractions Prescribed: 8
Plan Total Prescribed Dose: 16 Gy
Reference Point Dosage Given to Date: 6 Gy
Reference Point Session Dosage Given: 2 Gy
Session Number: 31

## 2022-03-14 ENCOUNTER — Other Ambulatory Visit: Payer: Self-pay

## 2022-03-14 ENCOUNTER — Inpatient Hospital Stay: Payer: BC Managed Care – PPO | Attending: Oncology

## 2022-03-14 ENCOUNTER — Ambulatory Visit
Admission: RE | Admit: 2022-03-14 | Discharge: 2022-03-14 | Disposition: A | Discharge status: Home / Self Care | Payer: BC Managed Care – PPO | Source: Ambulatory Visit | Attending: Radiation Oncology | Admitting: Radiation Oncology

## 2022-03-14 DIAGNOSIS — C50211 Malignant neoplasm of upper-inner quadrant of right female breast: Secondary | ICD-10-CM | POA: Diagnosis not present

## 2022-03-14 DIAGNOSIS — Z510 Encounter for antineoplastic radiation therapy: Secondary | ICD-10-CM | POA: Diagnosis not present

## 2022-03-14 LAB — RAD ONC ARIA SESSION SUMMARY
Course Elapsed Days: 45
Plan Fractions Treated to Date: 4
Plan Prescribed Dose Per Fraction: 2 Gy
Plan Total Fractions Prescribed: 8
Plan Total Prescribed Dose: 16 Gy
Reference Point Dosage Given to Date: 8 Gy
Reference Point Session Dosage Given: 2 Gy
Session Number: 32

## 2022-03-15 ENCOUNTER — Other Ambulatory Visit: Payer: Self-pay

## 2022-03-15 ENCOUNTER — Ambulatory Visit
Admission: RE | Admit: 2022-03-15 | Discharge: 2022-03-15 | Disposition: A | Discharge status: Home / Self Care | Payer: BC Managed Care – PPO | Source: Ambulatory Visit | Attending: Radiation Oncology | Admitting: Radiation Oncology

## 2022-03-15 DIAGNOSIS — Z510 Encounter for antineoplastic radiation therapy: Secondary | ICD-10-CM | POA: Diagnosis not present

## 2022-03-15 DIAGNOSIS — C50211 Malignant neoplasm of upper-inner quadrant of right female breast: Secondary | ICD-10-CM | POA: Diagnosis not present

## 2022-03-15 LAB — RAD ONC ARIA SESSION SUMMARY
Course Elapsed Days: 46
Plan Fractions Treated to Date: 5
Plan Prescribed Dose Per Fraction: 2 Gy
Plan Total Fractions Prescribed: 8
Plan Total Prescribed Dose: 16 Gy
Reference Point Dosage Given to Date: 10 Gy
Reference Point Session Dosage Given: 2 Gy
Session Number: 33

## 2022-03-18 ENCOUNTER — Ambulatory Visit
Admission: RE | Admit: 2022-03-18 | Discharge: 2022-03-18 | Disposition: A | Discharge status: Home / Self Care | Payer: BC Managed Care – PPO | Source: Ambulatory Visit | Attending: Radiation Oncology | Admitting: Radiation Oncology

## 2022-03-18 ENCOUNTER — Other Ambulatory Visit: Payer: Self-pay

## 2022-03-18 DIAGNOSIS — Z510 Encounter for antineoplastic radiation therapy: Secondary | ICD-10-CM | POA: Diagnosis not present

## 2022-03-18 DIAGNOSIS — C50211 Malignant neoplasm of upper-inner quadrant of right female breast: Secondary | ICD-10-CM | POA: Diagnosis not present

## 2022-03-18 LAB — RAD ONC ARIA SESSION SUMMARY
Course Elapsed Days: 49
Plan Fractions Treated to Date: 6
Plan Prescribed Dose Per Fraction: 2 Gy
Plan Total Fractions Prescribed: 8
Plan Total Prescribed Dose: 16 Gy
Reference Point Dosage Given to Date: 12 Gy
Reference Point Session Dosage Given: 2 Gy
Session Number: 34

## 2022-03-19 ENCOUNTER — Other Ambulatory Visit: Payer: Self-pay | Admitting: *Deleted

## 2022-03-19 ENCOUNTER — Ambulatory Visit
Admission: RE | Admit: 2022-03-19 | Discharge: 2022-03-19 | Disposition: A | Discharge status: Home / Self Care | Payer: BC Managed Care – PPO | Source: Ambulatory Visit | Attending: Radiation Oncology | Admitting: Radiation Oncology

## 2022-03-19 ENCOUNTER — Other Ambulatory Visit: Payer: Self-pay

## 2022-03-19 DIAGNOSIS — Z510 Encounter for antineoplastic radiation therapy: Secondary | ICD-10-CM | POA: Diagnosis not present

## 2022-03-19 DIAGNOSIS — C50211 Malignant neoplasm of upper-inner quadrant of right female breast: Secondary | ICD-10-CM | POA: Diagnosis not present

## 2022-03-19 DIAGNOSIS — Z170 Estrogen receptor positive status [ER+]: Secondary | ICD-10-CM | POA: Diagnosis not present

## 2022-03-19 LAB — RAD ONC ARIA SESSION SUMMARY
Course Elapsed Days: 50
Plan Fractions Treated to Date: 7
Plan Prescribed Dose Per Fraction: 2 Gy
Plan Total Fractions Prescribed: 8
Plan Total Prescribed Dose: 16 Gy
Reference Point Dosage Given to Date: 14 Gy
Reference Point Session Dosage Given: 2 Gy
Session Number: 35

## 2022-03-19 MED ORDER — SILVER SULFADIAZINE 1 % EX CREA: 1.0000 | TOPICAL_CREAM | Freq: Two times a day (BID) | CUTANEOUS | 2 refills | Status: DC

## 2022-03-20 ENCOUNTER — Ambulatory Visit
Admission: RE | Admit: 2022-03-20 | Discharge: 2022-03-20 | Disposition: A | Discharge status: Home / Self Care | Payer: BC Managed Care – PPO | Source: Ambulatory Visit | Attending: Radiation Oncology | Admitting: Radiation Oncology

## 2022-03-20 ENCOUNTER — Other Ambulatory Visit: Payer: Self-pay

## 2022-03-20 DIAGNOSIS — C50211 Malignant neoplasm of upper-inner quadrant of right female breast: Secondary | ICD-10-CM | POA: Diagnosis not present

## 2022-03-20 DIAGNOSIS — Z510 Encounter for antineoplastic radiation therapy: Secondary | ICD-10-CM | POA: Diagnosis not present

## 2022-03-20 LAB — RAD ONC ARIA SESSION SUMMARY
Course Elapsed Days: 51
Plan Fractions Treated to Date: 8
Plan Prescribed Dose Per Fraction: 2 Gy
Plan Total Fractions Prescribed: 8
Plan Total Prescribed Dose: 16 Gy
Reference Point Dosage Given to Date: 16 Gy
Reference Point Session Dosage Given: 2 Gy
Session Number: 36

## 2022-03-25 ENCOUNTER — Other Ambulatory Visit: Payer: Self-pay | Admitting: Family Medicine

## 2022-03-27 NOTE — Telephone Encounter (Signed)
Please call patient and schedule a follow-up on diabetes. Send back for refill after appointment is scheduled.

## 2022-03-28 NOTE — Telephone Encounter (Signed)
Patient has been scheduled

## 2022-04-03 ENCOUNTER — Ambulatory Visit: Payer: BC Managed Care – PPO | Admitting: Family Medicine

## 2022-04-03 ENCOUNTER — Inpatient Hospital Stay: Payer: BC Managed Care – PPO | Attending: Oncology | Admitting: Oncology

## 2022-04-03 ENCOUNTER — Encounter: Payer: Self-pay | Admitting: *Deleted

## 2022-04-03 ENCOUNTER — Encounter: Payer: Self-pay | Admitting: Oncology

## 2022-04-03 ENCOUNTER — Encounter: Payer: Self-pay | Admitting: Family Medicine

## 2022-04-03 VITALS — BP 134/78 | HR 78 | Temp 97.5°F | Ht 63.0 in | Wt 186.0 lb

## 2022-04-03 VITALS — BP 138/86 | HR 81 | Temp 97.4°F | Resp 18 | Wt 187.2 lb

## 2022-04-03 DIAGNOSIS — E6609 Other obesity due to excess calories: Secondary | ICD-10-CM

## 2022-04-03 DIAGNOSIS — E1169 Type 2 diabetes mellitus with other specified complication: Secondary | ICD-10-CM | POA: Diagnosis not present

## 2022-04-03 DIAGNOSIS — I10 Essential (primary) hypertension: Secondary | ICD-10-CM | POA: Diagnosis not present

## 2022-04-03 DIAGNOSIS — E785 Hyperlipidemia, unspecified: Secondary | ICD-10-CM

## 2022-04-03 DIAGNOSIS — Z780 Asymptomatic menopausal state: Secondary | ICD-10-CM | POA: Insufficient documentation

## 2022-04-03 DIAGNOSIS — Z23 Encounter for immunization: Secondary | ICD-10-CM | POA: Diagnosis not present

## 2022-04-03 DIAGNOSIS — C50411 Malignant neoplasm of upper-outer quadrant of right female breast: Secondary | ICD-10-CM | POA: Diagnosis not present

## 2022-04-03 DIAGNOSIS — Z6832 Body mass index (BMI) 32.0-32.9, adult: Secondary | ICD-10-CM

## 2022-04-03 DIAGNOSIS — E119 Type 2 diabetes mellitus without complications: Secondary | ICD-10-CM

## 2022-04-03 DIAGNOSIS — Z170 Estrogen receptor positive status [ER+]: Secondary | ICD-10-CM | POA: Diagnosis not present

## 2022-04-03 DIAGNOSIS — Z800 Family history of malignant neoplasm of digestive organs: Secondary | ICD-10-CM | POA: Insufficient documentation

## 2022-04-03 DIAGNOSIS — Z923 Personal history of irradiation: Secondary | ICD-10-CM | POA: Diagnosis not present

## 2022-04-03 LAB — POCT GLYCOSYLATED HEMOGLOBIN (HGB A1C): Hemoglobin A1C: 7.9 % — AB (ref 4.0–5.6)

## 2022-04-03 MED ORDER — GLIPIZIDE ER 2.5 MG PO TB24: 2.5000 mg | ORAL_TABLET | Freq: Every day | ORAL | 3 refills | Status: DC

## 2022-04-03 MED ORDER — ANASTROZOLE 1 MG PO TABS: 1.0000 mg | ORAL_TABLET | Freq: Every day | ORAL | 2 refills | Status: DC

## 2022-04-03 NOTE — Patient Instructions (Addendum)
Don't forget to schedule your yearly diabetic eye exam   Flu shot today   Mix up your blood sugar testing times Some in am  Some in the pm (2 hours after a meal)  Keep a log of your blood sugar    Continue the metformin  Add glipizide 2.5 mg daily in am with breakfast   If your blood sugars run below 70 let us know   I want you to eat regularly (protein and carb)  Try to get most of your carbohydrates from produce (with the exception of white potatoes)  Eat less bread/pasta/rice/snack foods/cereals/sweets and other items from the middle of the grocery store (processed carbs)   Next time we will talk about cholesterol medicine   Follow up in 3 months

## 2022-04-03 NOTE — Assessment & Plan Note (Signed)
a1c ordered  Lab Results  Component Value Date   HGBA1C 7.9 (A) 04/03/2022   This is down slt  Continue metformin 1000 mg bid Add glipizide xl 2.5 mg daily (watching glucose closely) Continue low gly diet Schedul eye exam Plan to fu 3 mo and add statin then

## 2022-04-03 NOTE — Assessment & Plan Note (Signed)
bp in fair control at this time  BP Readings from Last 1 Encounters:  04/03/22 134/78   No changes needed Most recent labs reviewed  Disc lifstyle change with low sodium diet and exercise  Taking amlodipine 5 mg daily

## 2022-04-03 NOTE — Assessment & Plan Note (Signed)
Discussed how this problem influences overall health and the risks it imposes  Reviewed plan for weight loss with lower calorie diet (via better food choices and also portion control or program like weight watchers) and exercise building up to or more than 30 minutes 5 days per week including some aerobic activity    

## 2022-04-03 NOTE — Progress Notes (Signed)
Pt here for follow up. Pt reports discomfort to right breast

## 2022-04-03 NOTE — Assessment & Plan Note (Signed)
FSH is 57.8.  Estradiol less than 5.-Postmenopausal I recommend calcium 1200 mg and vitamin D supplementation

## 2022-04-03 NOTE — Assessment & Plan Note (Signed)
Disc goals for lipids and reasons to control them Rev last labs with pt Rev low sat fat diet in detail  Will add statin next time  Working on flucose control

## 2022-04-03 NOTE — Progress Notes (Signed)
Hematology/Oncology Progress note [phone] [phone]        REFERRING PROVIDER: Abner Greenspan, MD   Patient Care Team: Tower, Wynelle Fanny, MD as PCP - General  ASSESSMENT & PLAN:   Cancer Staging  Breast cancer in female [facility]) Staging form: Breast, AJCC 8th Edition - Pathologic: Stage IA (pT2, pN0, cM0, G2, ER+, PR+, HER2-, Oncotype DX score: 0) - Signed by Earlie Server, MD on 12/18/2021   Breast cancer in female [facility]) stage IA right breast cancer, ER/PR positive, HER2 negative. Pathology results were reviewed and discussed with patient. Oncotype DX recurrence score 0.  S/p adjuvant breast radiation.  Patient is postmenopausal.   Recommend adjuvant endocrine therapy after she finishes radiation. Rationale of using aromatase inhibitor -Arimidex  discussed with patient.  Side effects of Arimidex including but not limited to hot flush, joint pain, fatigue, mood swing, osteoporosis discussed with patient. Patient voices understanding and willing to proceed. Rx was sent to pharamcy  Check baseline bone density.   Postmenopausal FSH is 57.8.  Estradiol less than 5.-Postmenopausal I recommend calcium 1200 mg and vitamin D supplementation   Orders Placed This Encounter  Procedures   CBC with Differential/Platelet    Standing Status:   Future    Standing Expiration Date:   04/04/2023   Comprehensive metabolic panel    Standing Status:   Future    Standing Expiration Date:   04/03/2023   Follow-up 3 months.   All questions were answered. The patient knows to call the clinic with any problems, questions or concerns. No barriers to learning was detected.  Earlie Server, MD 04/03/2022   CHIEF COMPLAINTS/PURPOSE OF CONSULTATION:  Right breast cancer  HISTORY OF PRESENTING ILLNESS:  [name] 62 y.o. female presents to establish care for right breast cancer I have reviewed her chart and materials related to her cancer extensively and collaborated history  with the patient. Summary of oncologic history is as follows: Oncology History  Breast cancer in female [facility])  10/24/2021 Mammogram   Bilateral diagnostic mammogram and targeted ultrasound right breast showed 1. Suspicious right breast mass corresponding with the patient's palpable lump. Recommendation is for ultrasound-guided biopsy. 2. No suspicious right axillary lymphadenopathy. 3. No mammographic evidence of malignancy on the left    11/19/2021 Initial Diagnosis   Breast cancer in female [facility])  -Patient self palpated right breast lump for about 3 months. -11/09/2021, right breast 11:00, 4 cm from nipple, ultrasound-guided biopsy showed invasive mammary carcinoma, ductal NOS.  Grade 2, ER +90%, PR + 90%, HER2 negative [score 1+)   11/19/2021 Cancer Staging   Staging form: Breast, AJCC 8th Edition - Pathologic: Stage IA (pT2, pN0, cM0, G2, ER+, PR+, HER2-, Oncotype DX score: 0) - Signed by Earlie Server, MD on 12/18/2021 Multigene prognostic tests performed: Oncotype DX Recurrence score range: Less than 11 Histologic grading system: 3 grade system   11/26/2021 Surgery   She underwent right lumpectomy with SLNB  Pathology showed  - Invasive ductal carcinoma, 2.2 cm, grade 2  - Resection margins are negative for carcinoma - the anterior margin is less than 0.5 mm from carcinoma, 10 sentinel lymph nodes were excised and all 10 negative for carcinoma. pT2 pN0 ER +90%, PR + 90%, HER2 negative (1+),      11/26/2021 Oncotype testing   Oncotype Dx recurrence score 0   01/28/2022 - 03/20/2022 Radiation Therapy   S/p adjuvant right breast radiation.     INTERVAL HISTORY [name] is a 62  y.o. female who has above history reviewed by me today presents for follow up visit for right breast stage Ia breast cancer Patient is status post radiation.  She reports some soreness of right breast.  Otherwise no new complaints. She has intermittent postmenopausal symptoms including hot flash  and sweating at night.   MEDICAL HISTORY:  Past Medical History:  Diagnosis Date   Allergy    Anal intraepithelial neoplasia II (AIN II)    Arthritis    shoulder, feet   B12 deficiency    Essential hypertension    followed by pcp--- currently no meds,  states when she stopped sinus medication otc , bp better   High frequency hearing loss    History of abnormal Pap smear    History of uterine leiomyoma    Hx of migraine headaches    Iron deficiency anemia    Type 2 diabetes mellitus ([place])    followed by pcp    SURGICAL HISTORY: Past Surgical History:  Procedure Laterality Date   BREAST LUMPECTOMY WITH SENTINEL LYMPH NODE BIOPSY Right 11/26/2021   Procedure: RIGHT BREAST LUMPECTOMY WITH SENTINEL LYMPH NODE BIOPSY;  Surgeon: Coralie Keens, MD;  Location: [place];  Service: General;  Laterality: Right;   BUNIONECTOMY Bilateral 1982   CARDIOVASCULAR STRESS TEST  09-19-2010   @ [place]   normal nuclear study w/ no evidence ishemia/  normal LV funciton and wall motion , ef 81%   COLONOSCOPY WITH ESOPHAGOGASTRODUODENOSCOPY (EGD)  last one 08-21-2017   HIGH RESOLUTION ANOSCOPY N/A 10/09/2017   Procedure: HIGH RESOLUTION ANOSCOPY,  BIOPSY  EXCISION;  Surgeon: Leighton Ruff, MD;  Location: [place];  Service: General;  Laterality: N/A;   LAPAROSCOPIC ASSISTED VAGINAL HYSTERECTOMY  09-18-2005    dr Kennon Rounds  [place]  09-04-2001    dr [facility] [facility]   w/ lysis adhesions   NASAL ENDOSCOPY  03/2007   neg   TONSILLECTOMY  child    SOCIAL HISTORY: Social History   Socioeconomic History   Marital status: Single    Spouse name: Not on file   Number of children: 0   Years of education: Not on file   Highest education level: Not on file  Occupational History   Not on file  Tobacco Use   Smoking status: Never    Passive exposure: Never   Smokeless tobacco: Never  Vaping Use   Vaping Use: Never used  Substance and Sexual Activity    Alcohol use: Yes    Alcohol/week: 2.0 standard drinks of alcohol    Types: 2 Glasses of wine per week   Drug use: No   Sexual activity: Yes    Partners: Male    Birth control/protection: Surgical    Comment: LAVH  Other Topics Concern   Not on file  Social History Narrative   Not on file   Social Determinants of Health   Financial Resource Strain: Not on file  Food Insecurity: Not on file  Transportation Needs: Not on file  Physical Activity: Not on file  Stress: Not on file  Social Connections: Not on file  Intimate Partner Violence: Not on file    FAMILY HISTORY: Family History  Problem Relation Age of Onset   Rectal cancer Father    Colon cancer Father    Heart disease Sister    Arthritis Sister    Diabetes Sister    Hypertension Sister    Diabetes Maternal Grandmother  Hypertension Maternal Grandmother    Sudden death Daughter        after giving birth / ? if blood clot   Breast cancer Neg Hx    Esophageal cancer Neg Hx    Stomach cancer Neg Hx     ALLERGIES:  is allergic to lisinopril, losartan, and amoxicillin-pot clavulanate.  MEDICATIONS:  Current Outpatient Medications  Medication Sig Dispense Refill   amLODipine (NORVASC) 5 MG tablet TAKE 1 TABLET (5 MG TOTAL) BY MOUTH DAILY. 90 tablet 1   anastrozole (ARIMIDEX) 1 MG tablet Take 1 tablet (1 mg total) by mouth daily. 30 tablet 2   diclofenac sodium (VOLTAREN) 1 % GEL Apply 4 g topically 4 (four) times daily. 100 g 2   glipiZIDE (GLUCOTROL XL) 2.5 MG 24 hr tablet Take 1 tablet (2.5 mg total) by mouth daily with breakfast. 90 tablet 3   loratadine (CLARITIN) 10 MG tablet Take 10 mg by mouth daily as needed for allergies.     silver sulfADIAZINE (SILVADENE) 1 % cream Apply 1 Application topically 2 (two) times daily. 50 g 2   traMADol (ULTRAM) 50 MG tablet Take 1 tablet (50 mg total) by mouth every 6 (six) hours as needed. 30 tablet 0   metFORMIN (GLUCOPHAGE) 1000 MG tablet TAKE 1 TABLET (1,000 MG TOTAL)  BY MOUTH TWICE A DAY WITH FOOD 180 tablet 2   No current facility-administered medications for this visit.    Review of Systems  Constitutional:  Negative for appetite change, chills, fatigue and fever.  HENT:   Negative for hearing loss and voice change.   Eyes:  Negative for eye problems.  Respiratory:  Negative for chest tightness and cough.   Cardiovascular:  Negative for chest pain.  Gastrointestinal:  Negative for abdominal distention, abdominal pain and blood in stool.  Endocrine: Negative for hot flashes.  Genitourinary:  Negative for difficulty urinating and frequency.   Musculoskeletal:  Negative for arthralgias.  Skin:  Negative for itching and rash.  Neurological:  Negative for extremity weakness.  Hematological:  Negative for adenopathy.  Psychiatric/Behavioral:  Negative for confusion.    Right breast mass.  PHYSICAL EXAMINATION: ECOG PERFORMANCE STATUS: 0 - Asymptomatic  Vitals:   04/03/22 1337  BP: 138/86  Pulse: 81  Resp: 18  Temp: (!) 97.4 F (36.3 C)   Filed Weights   04/03/22 1337  Weight: 187 lb 3.2 oz (84.9 kg)    Physical Exam HENT:     Head: Normocephalic.     Mouth/Throat:     Pharynx: No oropharyngeal exudate.  Eyes:     General: No scleral icterus. Cardiovascular:     Rate and Rhythm: Normal rate.  Pulmonary:     Effort: Pulmonary effort is normal. No respiratory distress.  Abdominal:     General: There is no distension.  Musculoskeletal:        General: Normal range of motion.     Cervical back: Normal range of motion.  Skin:    Findings: No rash.  Neurological:     Mental Status: She is alert and oriented to person, place, and time. Mental status is at baseline.     Cranial Nerves: No cranial nerve deficit.     Motor: No abnormal muscle tone.  Psychiatric:        Mood and Affect: Mood and affect normal.       LABORATORY DATA:  I have reviewed the data as listed    Latest Ref Rng & Units 01/31/2022  3:32 PM 11/19/2021    11:57 AM 10/09/2021    9:19 AM  CBC  WBC 4.0 - 10.5 K/uL 4.0  4.0  3.6   Hemoglobin 12.0 - 15.0 g/dL 11.7  12.3  12.0   Hematocrit 36.0 - 46.0 % 36.6  37.7  37.2   Platelets 150 - 400 K/uL 159  150  144.0       Latest Ref Rng & Units 11/19/2021   11:57 AM 10/09/2021    9:19 AM 08/25/2020    4:07 PM  CMP  Glucose 70 - 99 mg/dL 122  157  157   BUN 6 - 20 mg/dL _0 Creatinine 0.44 - 1.00 mg/dL 0.69  0.68  0.73   Sodium 135 - 145 mmol/L 137  141  140   Potassium 3.5 - 5.1 mmol/L 3.9  4.5  4.2   Chloride 98 - 111 mmol/L 105  105  104   CO2 22 - 32 mmol/L _1 Calcium 8.9 - 10.3 mg/dL 9.1  9.5  9.9   Total Protein 6.5 - 8.1 g/dL 7.5  7.0    Total Bilirubin 0.3 - 1.2 mg/dL 0.5  0.3    Alkaline Phos 38 - 126 U/L 73  77    AST 15 - 41 U/L 23  16    ALT 0 - 44 U/L 15  12        RADIOGRAPHIC STUDIES: I have personally reviewed the radiological images as listed and agreed with the findings in the report. No results found.

## 2022-04-03 NOTE — Progress Notes (Signed)
Subjective:    Patient ID: [name], female    DOB: 1960-04-03, 62 y.o.   MRN: [id number]  HPI Pt presents for f/u of DM2 and chronic medical problems   Wt Readings from Last 3 Encounters:  04/03/22 186 lb (84.4 kg)  01/10/22 189 lb 9.6 oz (86 kg)  12/18/21 185 lb (83.9 kg)   32.95 kg/m  Vitals:   04/03/22 1054  BP: 134/78  Pulse: 78  Temp: (!) 97.5 F (36.4 C)  TempSrc: Temporal  SpO2: 99%  Weight: 186 lb (84.4 kg)  Height: '5\' 3"'$  (1.6 m)     Treated for breast cancer since last visit  Had radioactive seen guided R breast lumpectomy and sentinel node bx Radiation treatment -finished her tx 2 wk ago   Sees onc today at 1 to discuss anti estrogen  Glad she got it early   Is still going to work   Had a stomach bug 3 wk ago  Vomited for 24 hours    HTN bp is stable today  No cp or palpitations or headaches or edema  No side effects to medicines  BP Readings from Last 3 Encounters:  04/03/22 134/78  01/10/22 (!) 148/91  12/18/21 136/89    Amlodipine 5 mg daily  Cannot take ace or arb  Bp was always high at the cancer center    DM2 Lab Results  Component Value Date   HGBA1C 8.7 (H) 10/09/2021   Metformin 1000 mg bid  She checks blood sugar daily  In the am / fasting  Usually runs 130s     7.9 today  This is mildly improved   Not much appetite  Not a lot of junk  Ends up eating a few bites and then done   Avoids processed foods  Uses art sweetener   Not eating sweets  No sweet drinks   No bread  Cut way back on pasta and rice   Apples/oranges  Some green veg - just not a lot   Cannot exercise now due to fatigue  Works long hours and declines any type of exercise additionally  She is on feet all day     Lab Results  Component Value Date   MICROALBUR 1.7 10/09/2021   MICROALBUR 2.1 (H) 08/30/2016  Nl ratio  Eye exam :  needs to schedule   Flu shot : wants today     Hyperlipidemia Lab Results  Component  Value Date   CHOL 196 10/09/2021   HDL 52.40 10/09/2021   LDLCALC 129 (H) 10/09/2021   TRIG 72.0 10/09/2021   CHOLHDL 4 10/09/2021    Diet is better  Plan to start statin next time  Patient Active Problem List   Diagnosis Date Noted   Postmenopausal 12/18/2021   Breast cancer in female [facility]) 11/19/2021   Goals of care, counseling/discussion 11/19/2021   Lump, breast 10/09/2021   Gastroparesis 08/25/2020   Hair loss 08/25/2020   Epigastric pain 08/15/2020   Anxiety 02/24/2018   Dysuria 01/30/2018   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   Essential hypertension 10/11/2016   Screening mammogram, encounter for 08/30/2016   Routine general medical examination at a health care facility 08/30/2016   Encounter for screening for HIV 08/30/2016   Need for hepatitis C screening test 08/30/2016   History of herpes zoster 05/12/2015   Hyperlipidemia associated with type 2 diabetes mellitus ([place]) 12/21/2012   Class 1 obesity due to excess calories with body mass index (BMI)  of 32.0 to 32.9 in adult 12/21/2012   Vaginal atrophy 12/18/2012   Dyspepsia 08/20/2012   Rectal bleeding 12/04/2011   Lower back pain 12/04/2011   ANEMIA, B12 DEFICIENCY 10/05/2008   THROMBOCYTOPENIA 10/05/2008   Allergic rhinitis 10/05/2008   Chronic headache 10/05/2008   FREQUENCY, URINARY 10/05/2008   Type 2 diabetes mellitus without complications ([place]) 52/77/8242   ANEMIA, IRON DEFICIENCY 01/26/2008   Past Medical History:  Diagnosis Date   Allergy    Anal intraepithelial neoplasia II (AIN II)    Arthritis    shoulder, feet   B12 deficiency    Essential hypertension    followed by pcp--- currently no meds,  states when she stopped sinus medication otc , bp better   High frequency hearing loss    History of abnormal Pap smear    History of uterine leiomyoma    Hx of migraine headaches    Iron deficiency anemia    Type 2 diabetes mellitus ([place])    followed by pcp   Past  Surgical History:  Procedure Laterality Date   BREAST LUMPECTOMY WITH SENTINEL LYMPH NODE BIOPSY Right 11/26/2021   Procedure: RIGHT BREAST LUMPECTOMY WITH SENTINEL LYMPH NODE BIOPSY;  Surgeon: Coralie Keens, MD;  Location: [place];  Service: General;  Laterality: Right;   BUNIONECTOMY Bilateral 1982   CARDIOVASCULAR STRESS TEST  09-19-2010   @ [place]   normal nuclear study w/ no evidence ishemia/  normal LV funciton and wall motion , ef 81%   COLONOSCOPY WITH ESOPHAGOGASTRODUODENOSCOPY (EGD)  last one 08-21-2017   HIGH RESOLUTION ANOSCOPY N/A 10/09/2017   Procedure: HIGH RESOLUTION ANOSCOPY,  BIOPSY  EXCISION;  Surgeon: Leighton Ruff, MD;  Location: [place];  Service: General;  Laterality: N/A;   LAPAROSCOPIC ASSISTED VAGINAL HYSTERECTOMY  09-18-2005    dr Kennon Rounds  [place]  09-04-2001    dr [facility]   w/ lysis adhesions   NASAL ENDOSCOPY  03/2007   neg   TONSILLECTOMY  child   Social History   Tobacco Use   Smoking status: Never    Passive exposure: Never   Smokeless tobacco: Never  Vaping Use   Vaping Use: Never used  Substance Use Topics   Alcohol use: Yes    Alcohol/week: 2.0 standard drinks of alcohol    Types: 2 Glasses of wine per week   Drug use: No   Family History  Problem Relation Age of Onset   Rectal cancer Father    Colon cancer Father    Heart disease Sister    Arthritis Sister    Diabetes Sister    Hypertension Sister    Diabetes Maternal Grandmother    Hypertension Maternal Grandmother    Sudden death Daughter        after giving birth / ? if blood clot   Breast cancer Neg Hx    Esophageal cancer Neg Hx    Stomach cancer Neg Hx    Allergies  Allergen Reactions   Lisinopril Cough   Losartan     Chest pressure    Amoxicillin-Pot Clavulanate Itching and Rash   Current Outpatient Medications on File Prior to Visit  Medication Sig Dispense Refill   amLODipine (NORVASC) 5 MG tablet TAKE 1 TABLET  (5 MG TOTAL) BY MOUTH DAILY. 90 tablet 1   diclofenac sodium (VOLTAREN) 1 % GEL Apply 4 g topically 4 (four) times daily. 100 g 2   loratadine (CLARITIN) 10  MG tablet Take 10 mg by mouth daily as needed for allergies.     metFORMIN (GLUCOPHAGE) 1000 MG tablet TAKE 1 TABLET (1,000 MG TOTAL) BY MOUTH TWICE A DAY WITH FOOD 180 tablet 0   silver sulfADIAZINE (SILVADENE) 1 % cream Apply 1 Application topically 2 (two) times daily. 50 g 2   traMADol (ULTRAM) 50 MG tablet Take 1 tablet (50 mg total) by mouth every 6 (six) hours as needed. 30 tablet 0   No current facility-administered medications on file prior to visit.     Review of Systems  Constitutional:  Positive for fatigue. Negative for activity change, appetite change, fever and unexpected weight change.  HENT:  Negative for congestion, ear pain, rhinorrhea, sinus pressure and sore throat.   Eyes:  Negative for pain, redness and visual disturbance.  Respiratory:  Negative for cough, shortness of breath and wheezing.   Cardiovascular:  Negative for chest pain and palpitations.  Gastrointestinal:  Negative for abdominal pain, blood in stool, constipation and diarrhea.  Endocrine: Negative for polydipsia and polyuria.  Genitourinary:  Negative for dysuria, frequency and urgency.  Musculoskeletal:  Negative for arthralgias, back pain and myalgias.  Skin:  Negative for pallor and rash.  Allergic/Immunologic: Negative for environmental allergies.  Neurological:  Negative for dizziness, syncope and headaches.  Hematological:  Negative for adenopathy. Does not bruise/bleed easily.  Psychiatric/Behavioral:  Negative for decreased concentration and dysphoric mood. The patient is not nervous/anxious.        Objective:   Physical Exam Constitutional:      General: She is not in acute distress.    Appearance: Normal appearance. She is well-developed. She is obese. She is not ill-appearing or diaphoretic.  HENT:     Head: Normocephalic and  atraumatic.  Eyes:     Conjunctiva/sclera: Conjunctivae normal.     Pupils: Pupils are equal, round, and reactive to light.  Neck:     Thyroid: No thyromegaly.     Vascular: No carotid bruit or JVD.  Cardiovascular:     Rate and Rhythm: Normal rate and regular rhythm.     Heart sounds: Normal heart sounds.     No gallop.  Pulmonary:     Effort: Pulmonary effort is normal. No respiratory distress.     Breath sounds: Normal breath sounds. No wheezing or rales.  Abdominal:     General: There is no distension or abdominal bruit.     Palpations: Abdomen is soft.  Musculoskeletal:     Cervical back: Normal range of motion and neck supple.     Right lower leg: No edema.     Left lower leg: No edema.  Lymphadenopathy:     Cervical: No cervical adenopathy.  Skin:    General: Skin is warm and dry.     Coloration: Skin is not pale.     Findings: No rash.  Neurological:     Mental Status: She is alert.     Coordination: Coordination normal.     Deep Tendon Reflexes: Reflexes are normal and symmetric. Reflexes normal.  Psychiatric:        Mood and Affect: Mood normal.           Assessment & Plan:   Problem List Items Addressed This Visit       Cardiovascular and Mediastinum   Essential hypertension    bp in fair control at this time  BP Readings from Last 1 Encounters:  04/03/22 134/78  No changes needed Most recent labs reviewed  Disc lifstyle change with low sodium diet and exercise  Taking amlodipine 5 mg daily         Endocrine   Hyperlipidemia associated with type 2 diabetes mellitus ([place])    Disc goals for lipids and reasons to control them Rev last labs with pt Rev low sat fat diet in detail  Will add statin next time  Working on flucose control       Relevant Medications   glipiZIDE (GLUCOTROL XL) 2.5 MG 24 hr tablet   Type 2 diabetes mellitus without complications ([facility]) - Primary    a1c ordered  Lab Results  Component Value Date   HGBA1C 7.9 (A)  04/03/2022  This is down slt  Continue metformin 1000 mg bid Add glipizide xl 2.5 mg daily (watching glucose closely) Continue low gly diet Schedul eye exam Plan to fu 3 mo and add statin then       Relevant Medications   glipiZIDE (GLUCOTROL XL) 2.5 MG 24 hr tablet   Other Relevant Orders   POCT glycosylated hemoglobin (Hb A1C) (Completed)     Other   Class 1 obesity due to excess calories with body mass index (BMI) of 32.0 to 32.9 in adult    Discussed how this problem influences overall health and the risks it imposes  Reviewed plan for weight loss with lower calorie diet (via better food choices and also portion control or program like weight watchers) and exercise building up to or more than 30 minutes 5 days per week including some aerobic activity        Relevant Medications   glipiZIDE (GLUCOTROL XL) 2.5 MG 24 hr tablet   Other Visit Diagnoses     Need for influenza vaccination       Relevant Orders   Flu Vaccine QUAD 6+ mos PF IM (Fluarix Quad PF) (Completed)

## 2022-04-03 NOTE — Assessment & Plan Note (Addendum)
stage IA right breast cancer, ER/PR positive, HER2 negative. Pathology results were reviewed and discussed with patient. Oncotype DX recurrence score 0.  S/p adjuvant breast radiation.  Patient is postmenopausal.   Recommend adjuvant endocrine therapy after she finishes radiation. Rationale of using aromatase inhibitor -Arimidex  discussed with patient.  Side effects of Arimidex including but not limited to hot flush, joint pain, fatigue, mood swing, osteoporosis discussed with patient. Patient voices understanding and willing to proceed. Rx was sent to pharamcy  Check baseline bone density.

## 2022-04-10 ENCOUNTER — Ambulatory Visit
Admission: RE | Admit: 2022-04-10 | Discharge: 2022-04-10 | Disposition: A | Discharge status: Home / Self Care | Payer: BC Managed Care – PPO | Source: Ambulatory Visit | Attending: Oncology | Admitting: Oncology

## 2022-04-10 DIAGNOSIS — C50411 Malignant neoplasm of upper-outer quadrant of right female breast: Secondary | ICD-10-CM | POA: Insufficient documentation

## 2022-04-10 DIAGNOSIS — Z780 Asymptomatic menopausal state: Secondary | ICD-10-CM | POA: Diagnosis not present

## 2022-04-10 DIAGNOSIS — Z170 Estrogen receptor positive status [ER+]: Secondary | ICD-10-CM | POA: Insufficient documentation

## 2022-04-22 ENCOUNTER — Other Ambulatory Visit: Payer: Self-pay | Admitting: Family Medicine

## 2022-04-22 ENCOUNTER — Encounter: Payer: Self-pay | Admitting: Family Medicine

## 2022-05-02 ENCOUNTER — Ambulatory Visit: Payer: BC Managed Care – PPO | Admitting: Radiation Oncology

## 2022-05-15 ENCOUNTER — Ambulatory Visit: Payer: BC Managed Care – PPO | Attending: Radiation Oncology | Admitting: Radiation Oncology

## 2022-05-15 DIAGNOSIS — C50211 Malignant neoplasm of upper-inner quadrant of right female breast: Secondary | ICD-10-CM | POA: Insufficient documentation

## 2022-05-15 DIAGNOSIS — Z510 Encounter for antineoplastic radiation therapy: Secondary | ICD-10-CM | POA: Insufficient documentation

## 2022-06-20 DIAGNOSIS — C50911 Malignant neoplasm of unspecified site of right female breast: Secondary | ICD-10-CM | POA: Diagnosis not present

## 2022-07-03 ENCOUNTER — Inpatient Hospital Stay: Payer: BC Managed Care – PPO

## 2022-07-03 ENCOUNTER — Telehealth: Payer: Self-pay | Admitting: Oncology

## 2022-07-03 ENCOUNTER — Encounter: Payer: Self-pay | Admitting: Family Medicine

## 2022-07-03 ENCOUNTER — Inpatient Hospital Stay: Payer: BC Managed Care – PPO | Admitting: Oncology

## 2022-07-03 ENCOUNTER — Ambulatory Visit: Payer: BC Managed Care – PPO | Admitting: Family Medicine

## 2022-07-03 VITALS — BP 128/82 | HR 65 | Temp 97.9°F | Ht 63.0 in | Wt 192.0 lb

## 2022-07-03 DIAGNOSIS — E66812 Obesity, class 2: Secondary | ICD-10-CM | POA: Insufficient documentation

## 2022-07-03 DIAGNOSIS — I10 Essential (primary) hypertension: Secondary | ICD-10-CM

## 2022-07-03 DIAGNOSIS — Z6834 Body mass index (BMI) 34.0-34.9, adult: Secondary | ICD-10-CM

## 2022-07-03 DIAGNOSIS — E6609 Other obesity due to excess calories: Secondary | ICD-10-CM

## 2022-07-03 DIAGNOSIS — D696 Thrombocytopenia, unspecified: Secondary | ICD-10-CM

## 2022-07-03 DIAGNOSIS — E119 Type 2 diabetes mellitus without complications: Secondary | ICD-10-CM | POA: Diagnosis not present

## 2022-07-03 DIAGNOSIS — J301 Allergic rhinitis due to pollen: Secondary | ICD-10-CM

## 2022-07-03 LAB — POCT GLYCOSYLATED HEMOGLOBIN (HGB A1C): Hemoglobin A1C: 6.6 % — AB (ref 4.0–5.6)

## 2022-07-03 MED ORDER — GLIPIZIDE ER 2.5 MG PO TB24: 2.5000 mg | ORAL_TABLET | Freq: Every day | ORAL | 3 refills | Status: DC

## 2022-07-03 MED ORDER — FLUTICASONE PROPIONATE 50 MCG/ACT NA SUSP: 2.0000 | Freq: Every day | NASAL | 2 refills | Status: DC

## 2022-07-03 MED ORDER — CETIRIZINE HCL 10 MG PO TABS: 10.0000 mg | ORAL_TABLET | Freq: Every day | ORAL | 3 refills | Status: AC

## 2022-07-03 NOTE — Assessment & Plan Note (Signed)
Discussed how this problem influences overall health and the risks it imposes  Reviewed plan for weight loss with lower calorie diet (via better food choices and also portion control or program like weight watchers) and exercise building up to or more than 30 minutes 5 days per week including some aerobic activity  Still recovering from breast cancer tx  Disc adding exercise gradually

## 2022-07-03 NOTE — Assessment & Plan Note (Signed)
Improved Lab Results  Component Value Date   HGBA1C 6.6 (A) 07/03/2022   Plan ot continue Metformin 1000 mg bid  Glipizide xl 2.5 mg daily (watching for low glu) Enc to keep working on low glycemic diet Add more exercise incl strength training  F/u 3 mo for annual exam Eye exam planned later this mo

## 2022-07-03 NOTE — Telephone Encounter (Signed)
Called pt to r/s missed appt, she would like to know why labs are needed. pt notified that nurse will call to explain

## 2022-07-03 NOTE — Assessment & Plan Note (Signed)
bp in fair control at this time  BP Readings from Last 1 Encounters:  07/03/22 128/82   No changes needed Most recent labs reviewed  Disc lifstyle change with low sodium diet and exercise  Taking amlodipine 5 mg daily

## 2022-07-03 NOTE — Assessment & Plan Note (Signed)
Seasonal/pollen  Will try change from claritin to zyrtec 10 mg daily in eve Also add back flonase 2 sp per nostril bilat daily   Update if not starting to improve in a week or if worsening   Enc to avoid allergens when able

## 2022-07-03 NOTE — Patient Instructions (Addendum)
Get your eye exam later this month at [facility] (tell them you need a diabetic eye exam and please send [place] the report)   Keep eating low glycemic diet  Try to get most of your carbohydrates from produce (with the exception of white potatoes)  Eat less bread/pasta/rice/snack foods/cereals/sweets and other items from the middle of the grocery store (processed carbs)   Take care of yourself   Start a little strength building exercise when you can - very slowly  Add some strength training to your routine, this is important for bone and brain health and can reduce your risk of falls and help your body use insulin properly and regulate weight  Light weights, exercise bands , and internet videos are a good way to start  Yoga (chair or regular), machines , floor exercises or a gym with machines are also good options     For allergies Stop the clartitin  Start zyrtec 10 mg daily at bedtime Flonase nasal spray as directed daily through allergy season   Continue current medicines  If low sugar (under 70) let us know   Follow up in 3 months for annual exam

## 2022-07-03 NOTE — Progress Notes (Signed)
Subjective:    Patient ID: [name], female    DOB: [birth year], 62 y.o.   MRN: [id number]  HPI Pt presents for f/u of DM2 and HTN and chronic medical problems Allergies-seasonal    Wt Readings from Last 3 Encounters:  07/03/22 192 lb (87.1 kg)  04/03/22 187 lb 3.2 oz (84.9 kg)  04/03/22 186 lb (84.4 kg)   34.01 kg/m  Vitals:   07/03/22 0829  BP: 128/82  Pulse: 65  Temp: 97.9 F (36.6 C)  SpO2: 97%     HTN bp is stable today  No cp or palpitations or headaches or edema  No side effects to medicines  BP Readings from Last 3 Encounters:  07/03/22 128/82  04/03/22 138/86  04/03/22 134/78    Amlodipine 5 mg daily   DM2 Lab Results  Component Value Date   HGBA1C 6.6 (A) 07/03/2022   Much improved from 7.9  Metformin 1000 mg gid  Last visit added glipizide xl 2.5 mg daily  She was cautious about watching for hypoglycemia   90s- 130s average   Eating better  No energy with radiation symptoms - ended treatment in December   Less appetite  No sweets  Eating smaller portions  Lots of fruits/veg  No longer eating pasta and rice     Eye exam-later this month  Foot care -good   Lab Results  Component Value Date   MICROALBUR 1.7 10/09/2021   MICROALBUR 2.1 (H) 08/30/2016     Runny nose Stuffy nose Sneezing  Clear mucous  No wheezing   Lab Results  Component Value Date   WBC 4.0 01/31/2022   HGB 11.7 (L) 01/31/2022   HCT 36.6 01/31/2022   MCV 81.9 01/31/2022   PLT 159 01/31/2022   Plt nl last time   Patient Active Problem List   Diagnosis Date Noted   Postmenopausal 12/18/2021   Breast cancer in female 11/19/2021   Goals of care, counseling/discussion 11/19/2021   Lump, breast 10/09/2021   Gastroparesis 08/25/2020   Hair loss 08/25/2020   Anxiety 02/24/2018   Dysuria 01/30/2018   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   Essential hypertension 10/11/2016   Screening mammogram, encounter for 08/30/2016    Routine general medical examination at a health care facility 08/30/2016   Encounter for screening for HIV 08/30/2016   Need for hepatitis C screening test 08/30/2016   History of herpes zoster 05/12/2015   Hyperlipidemia associated with type 2 diabetes mellitus 12/21/2012   Class 1 obesity due to excess calories with body mass index (BMI) of 32.0 to 32.9 in adult 12/21/2012   Vaginal atrophy 12/18/2012   Dyspepsia 08/20/2012   Rectal bleeding 12/04/2011   Lower back pain 12/04/2011   ANEMIA, B12 DEFICIENCY 10/05/2008   THROMBOCYTOPENIA 10/05/2008   Allergic rhinitis 10/05/2008   Chronic headache 10/05/2008   FREQUENCY, URINARY 10/05/2008   Type 2 diabetes mellitus without complications [id number]   ANEMIA, IRON DEFICIENCY 01/26/2008   Past Medical History:  Diagnosis Date   Allergy    Anal intraepithelial neoplasia II (AIN II)    Arthritis    shoulder, feet   B12 deficiency    Essential hypertension    followed by pcp--- currently no meds,  states when she stopped sinus medication otc , bp better   High frequency hearing loss    History of abnormal Pap smear    History of uterine leiomyoma    Hx of migraine headaches    Iron deficiency  anemia    Type 2 diabetes mellitus    followed by pcp   Past Surgical History:  Procedure Laterality Date   BREAST LUMPECTOMY WITH SENTINEL LYMPH NODE BIOPSY Right 11/26/2021   Procedure: RIGHT BREAST LUMPECTOMY WITH SENTINEL LYMPH NODE BIOPSY;  Surgeon: Coralie Keens, MD;  Location: [facility];  Service: General;  Laterality: Right;   BUNIONECTOMY Bilateral 1982   CARDIOVASCULAR STRESS TEST  09-19-2010   @ [place]   normal nuclear study w/ no evidence ishemia/  normal LV funciton and wall motion , ef 81%   COLONOSCOPY WITH ESOPHAGOGASTRODUODENOSCOPY (EGD)  last one 08-21-2017   HIGH RESOLUTION ANOSCOPY N/A 10/09/2017   Procedure: HIGH RESOLUTION ANOSCOPY,  BIOPSY  EXCISION;  Surgeon: Leighton Ruff, MD;  Location: [facility];   Service: General;  Laterality: N/A;   LAPAROSCOPIC ASSISTED VAGINAL HYSTERECTOMY  09-18-2005    dr Kennon Rounds  [place]  09-04-2001    dr Cletis Media [facility]   w/ lysis adhesions   NASAL ENDOSCOPY  03/2007   neg   TONSILLECTOMY  child   Social History   Tobacco Use   Smoking status: Never    Passive exposure: Never   Smokeless tobacco: Never  Vaping Use   Vaping Use: Never used  Substance Use Topics   Alcohol use: Yes    Alcohol/week: 2.0 standard drinks of alcohol    Types: 2 Glasses of wine per week   Drug use: No   Family History  Problem Relation Age of Onset   Rectal cancer Father    Colon cancer Father    Heart disease Sister    Arthritis Sister    Diabetes Sister    Hypertension Sister    Diabetes Maternal Grandmother    Hypertension Maternal Grandmother    Sudden death Daughter        after giving birth / ? if blood clot   Breast cancer Neg Hx    Esophageal cancer Neg Hx    Stomach cancer Neg Hx    Allergies  Allergen Reactions   Lisinopril Cough   Losartan     Chest pressure    Amoxicillin-Pot Clavulanate Itching and Rash   Current Outpatient Medications on File Prior to Visit  Medication Sig Dispense Refill   amLODipine (NORVASC) 5 MG tablet TAKE 1 TABLET (5 MG TOTAL) BY MOUTH DAILY. 90 tablet 1   diclofenac sodium (VOLTAREN) 1 % GEL Apply 4 g topically 4 (four) times daily. 100 g 2   metFORMIN (GLUCOPHAGE) 1000 MG tablet TAKE 1 TABLET (1,000 MG TOTAL) BY MOUTH TWICE A DAY WITH FOOD 180 tablet 2   silver sulfADIAZINE (SILVADENE) 1 % cream Apply 1 Application topically 2 (two) times daily. 50 g 2   traMADol (ULTRAM) 50 MG tablet Take 1 tablet (50 mg total) by mouth every 6 (six) hours as needed. 30 tablet 0   No current facility-administered medications on file prior to visit.    Review of Systems  Constitutional:  Positive for fatigue. Negative for activity change, appetite change, fever and unexpected weight change.  HENT:   Negative for congestion, ear pain, rhinorrhea, sinus pressure and sore throat.   Eyes:  Negative for pain, redness and visual disturbance.  Respiratory:  Negative for cough, shortness of breath and wheezing.   Cardiovascular:  Negative for chest pain and palpitations.  Gastrointestinal:  Negative for abdominal pain, blood in stool, constipation and diarrhea.  Endocrine: Negative for polydipsia and  polyuria.  Genitourinary:  Negative for dysuria, frequency and urgency.  Musculoskeletal:  Negative for arthralgias, back pain and myalgias.  Skin:  Negative for pallor and rash.  Allergic/Immunologic: Negative for environmental allergies.  Neurological:  Negative for dizziness, syncope and headaches.  Hematological:  Negative for adenopathy. Does not bruise/bleed easily.  Psychiatric/Behavioral:  Negative for decreased concentration and dysphoric mood. The patient is not nervous/anxious.        Objective:   Physical Exam Constitutional:      General: She is not in acute distress.    Appearance: Normal appearance. She is well-developed. She is obese. She is not ill-appearing or diaphoretic.  HENT:     Head: Normocephalic and atraumatic.     Nose: Rhinorrhea present.     Mouth/Throat:     Mouth: Mucous membranes are moist.     Pharynx: Oropharynx is clear.  Eyes:     General:        Right eye: No discharge.        Left eye: No discharge.     Conjunctiva/sclera: Conjunctivae normal.     Pupils: Pupils are equal, round, and reactive to light.  Neck:     Thyroid: No thyromegaly.     Vascular: No carotid bruit or JVD.  Cardiovascular:     Rate and Rhythm: Normal rate and regular rhythm.     Heart sounds: Normal heart sounds.     No gallop.  Pulmonary:     Effort: Pulmonary effort is normal. No respiratory distress.     Breath sounds: Normal breath sounds. No wheezing or rales.  Abdominal:     General: There is no distension or abdominal bruit.     Palpations: Abdomen is soft.   Musculoskeletal:     Cervical back: Normal range of motion and neck supple.     Right lower leg: No edema.     Left lower leg: No edema.  Lymphadenopathy:     Cervical: No cervical adenopathy.  Skin:    General: Skin is warm and dry.     Coloration: Skin is not pale.     Findings: No rash.  Neurological:     Mental Status: She is alert.     Coordination: Coordination normal.     Deep Tendon Reflexes: Reflexes are normal and symmetric. Reflexes normal.  Psychiatric:        Mood and Affect: Mood normal.           Assessment & Plan:   Problem List Items Addressed This Visit       Cardiovascular and Mediastinum   Essential hypertension    bp in fair control at this time  BP Readings from Last 1 Encounters:  07/03/22 128/82  No changes needed Most recent labs reviewed  Disc lifstyle change with low sodium diet and exercise  Taking amlodipine 5 mg daily         Respiratory   Allergic rhinitis    Seasonal/pollen  Will try change from claritin to zyrtec 10 mg daily in eve Also add back flonase 2 sp per nostril bilat daily   Update if not starting to improve in a week or if worsening   Enc to avoid allergens when able        Endocrine   Type 2 diabetes mellitus without complications - Primary    Improved Lab Results  Component Value Date   HGBA1C 6.6 (A) 07/03/2022  Plan ot continue Metformin 1000 mg bid  Glipizide xl 2.5 mg  daily (watching for low glu) Enc to keep working on low glycemic diet Add more exercise incl strength training  F/u 3 mo for annual exam Eye exam planned later this mo      Relevant Medications   glipiZIDE (GLIPIZIDE XL) 2.5 MG 24 hr tablet   Other Relevant Orders   POCT HgB A1C (Completed)     Hematopoietic and Hemostatic   THROMBOCYTOPENIA     Other   Class 1 obesity due to excess calories with serious comorbidity and body mass index (BMI) of 34.0 to 34.9 in adult    Discussed how this problem influences overall health and  the risks it imposes  Reviewed plan for weight loss with lower calorie diet (via better food choices and also portion control or program like weight watchers) and exercise building up to or more than 30 minutes 5 days per week including some aerobic activity  Still recovering from breast cancer tx  Disc adding exercise gradually      Relevant Medications   glipiZIDE (GLIPIZIDE XL) 2.5 MG 24 hr tablet

## 2022-07-03 NOTE — Assessment & Plan Note (Signed)
Plt ct nl at 159 last time

## 2022-07-05 NOTE — Telephone Encounter (Signed)
Attempt made to call pt, no answer. Detailed mssg left letting pt know that labs are needed in order to make sure blood count and liver function is ok, due to her taking Tamoxifen.

## 2022-07-08 ENCOUNTER — Inpatient Hospital Stay: Payer: BC Managed Care – PPO | Admitting: Oncology

## 2022-07-08 ENCOUNTER — Inpatient Hospital Stay: Payer: BC Managed Care – PPO

## 2022-07-09 ENCOUNTER — Ambulatory Visit: Payer: BC Managed Care – PPO | Admitting: Oncology

## 2022-07-09 ENCOUNTER — Other Ambulatory Visit: Payer: BC Managed Care – PPO

## 2022-07-13 ENCOUNTER — Other Ambulatory Visit: Payer: Self-pay | Admitting: Family Medicine

## 2022-08-21 LAB — HM DIABETES EYE EXAM

## 2022-08-23 DIAGNOSIS — H353113 Nonexudative age-related macular degeneration, right eye, advanced atrophic without subfoveal involvement: Secondary | ICD-10-CM | POA: Diagnosis not present

## 2023-02-05 ENCOUNTER — Ambulatory Visit: Payer: Self-pay | Admitting: Podiatry

## 2023-02-17 ENCOUNTER — Ambulatory Visit: Payer: BC Managed Care – PPO | Admitting: Podiatry

## 2023-02-17 ENCOUNTER — Ambulatory Visit (INDEPENDENT_AMBULATORY_CARE_PROVIDER_SITE_OTHER): Payer: BC Managed Care – PPO

## 2023-02-17 ENCOUNTER — Encounter: Payer: Self-pay | Admitting: Podiatry

## 2023-02-17 DIAGNOSIS — M19072 Primary osteoarthritis, left ankle and foot: Secondary | ICD-10-CM

## 2023-02-17 DIAGNOSIS — T847XXA Infection and inflammatory reaction due to other internal orthopedic prosthetic devices, implants and grafts, initial encounter: Secondary | ICD-10-CM

## 2023-02-17 MED ORDER — MELOXICAM 7.5 MG PO TABS: 7.5000 mg | ORAL_TABLET | Freq: Every day | ORAL | 0 refills | Status: DC

## 2023-02-17 MED ORDER — TRIAMCINOLONE ACETONIDE 40 MG/ML IJ SUSP
20.0000 mg | Freq: Once | INTRAMUSCULAR | Status: AC
Start: 2023-02-17 — End: 2023-02-17
  Administered 2023-02-17: 20 mg

## 2023-02-17 NOTE — Progress Notes (Signed)
Subjective:  Patient ID: [name], female    DOB: [birth year],  MRN: [id number] HPI Chief Complaint  Patient presents with   Foot Pain    Dorsal midfoot left - aching x couple months, no injury, tried pain patch-no help  1st MPJ right - needs a pin removal as discuss years ago, having increased pain   New Patient (Initial Visit)    62 y.o. female presents with the above complaint.   ROS: Denies fever chills nausea vomiting muscle aches pains calf pain back pain chest pain shortness of breath.  Past Medical History:  Diagnosis Date   Allergy    Anal intraepithelial neoplasia II (AIN II)    Arthritis    shoulder, feet   B12 deficiency    Essential hypertension    followed by pcp--- currently no meds,  states when she stopped sinus medication otc , bp better   High frequency hearing loss    History of abnormal Pap smear    History of uterine leiomyoma    Hx of migraine headaches    Iron deficiency anemia    Type 2 diabetes mellitus (HCC)    followed by pcp   Past Surgical History:  Procedure Laterality Date   BREAST LUMPECTOMY WITH SENTINEL LYMPH NODE BIOPSY Right 11/26/2021   Procedure: RIGHT BREAST LUMPECTOMY WITH SENTINEL LYMPH NODE BIOPSY;  Surgeon: Abigail Miyamoto, MD;  Location: [facility];  Service: General;  Laterality: Right;   BUNIONECTOMY Bilateral 1982   CARDIOVASCULAR STRESS TEST  09-19-2010   @ [facility]   normal nuclear study w/ no evidence ishemia/  normal LV funciton and wall motion , ef 81%   COLONOSCOPY WITH ESOPHAGOGASTRODUODENOSCOPY (EGD)  last one 08-21-2017   HIGH RESOLUTION ANOSCOPY N/A 10/09/2017   Procedure: HIGH RESOLUTION ANOSCOPY,  BIOPSY  EXCISION;  Surgeon: Romie Levee, MD;  Location: [facility];  Service: General;  Laterality: N/A;   LAPAROSCOPIC ASSISTED VAGINAL HYSTERECTOMY  09-18-2005    dr Shawnie Pons  [facility]   LEEP     MYOMECTOMY ABDOMINAL APPROACH  09-04-2001    dr Estanislado Pandy [facility]   w/ lysis adhesions   NASAL ENDOSCOPY  03/2007    neg   TONSILLECTOMY  child    Current Outpatient Medications:    meloxicam (MOBIC) 7.5 MG tablet, Take 1 tablet (7.5 mg total) by mouth daily., Disp: 30 tablet, Rfl: 0   amLODipine (NORVASC) 5 MG tablet, TAKE 1 TABLET (5 MG TOTAL) BY MOUTH DAILY., Disp: 90 tablet, Rfl: 2   cetirizine (ZYRTEC) 10 MG tablet, Take 1 tablet (10 mg total) by mouth at bedtime., Disp: 90 tablet, Rfl: 3   diclofenac sodium (VOLTAREN) 1 % GEL, Apply 4 g topically 4 (four) times daily., Disp: 100 g, Rfl: 2   fluticasone (FLONASE) 50 MCG/ACT nasal spray, Place 2 sprays into both nostrils daily., Disp: 48 g, Rfl: 2   glipiZIDE (GLIPIZIDE XL) 2.5 MG 24 hr tablet, Take 1 tablet (2.5 mg total) by mouth daily with breakfast., Disp: 90 tablet, Rfl: 3   metFORMIN (GLUCOPHAGE) 1000 MG tablet, TAKE 1 TABLET (1,000 MG TOTAL) BY MOUTH TWICE A DAY WITH FOOD, Disp: 180 tablet, Rfl: 2   silver sulfADIAZINE (SILVADENE) 1 % cream, Apply 1 Application topically 2 (two) times daily., Disp: 50 g, Rfl: 2   traMADol (ULTRAM) 50 MG tablet, Take 1 tablet (50 mg total) by mouth every 6 (six) hours as needed., Disp: 30 tablet, Rfl: 0  Allergies  Allergen Reactions   Lisinopril Cough  Losartan     Chest pressure    Amoxicillin-Pot Clavulanate Itching and Rash   Review of Systems Objective:  There were no vitals filed for this visit.  General: Well developed, nourished, in no acute distress, alert and oriented x3   Dermatological: Skin is warm, dry and supple bilateral. Nails x 10 are well maintained; remaining integument appears unremarkable at this time. There are no open sores, no preulcerative lesions, no rash or signs of infection present.  Vascular: Dorsalis Pedis artery and Posterior Tibial artery pedal pulses are 2/4 bilateral with immedate capillary fill time. Pedal hair growth present. No varicosities and no lower extremity edema present bilateral.   Neruologic: Grossly intact via light touch bilateral. Vibratory intact via  tuning fork bilateral. Protective threshold with Semmes Wienstein monofilament intact to all pedal sites bilateral. Patellar and Achilles deep tendon reflexes 2+ bilateral. No Babinski or clonus noted bilateral.   Musculoskeletal: No gross boney pedal deformities bilateral. No pain, crepitus, or limitation noted with foot and ankle range of motion bilateral. Muscular strength 5/5 in all groups tested bilateral.  Severe pain on palpation of the dorsal aspect of the left foot.  This is consistent with osteoarthritic change spurring nonpulsatile masses to the dorsal aspect most consistent with osteoarthritic change entrapment of the intermediate medial and dorsal cutaneous nerves.  Deep peroneal nerve is also entrapped.  Numbness and tingling in the toes and severe pain across the dorsum of the left foot.  Right foot demonstrates mild bunion deformity with a palpable K wire to the dorsal aspect of the first metatarsal.  Gait: Unassisted, Nonantalgic.    Radiographs: Radiographs taken today demonstrate an osseously mature individual with good bone mineralization.  Significant osteoarthritic change of the midfoot bilaterally consistent with change at the first metatarsal phalangeal joint level right where the K wire is located as well as left where the [facility] arthroplasty with a single silicone implant is located.  Significant increase in the first intermetatarsal angle of the right foot greater than normal value previous radiographs demonstrate a more narrow angle which indicates a worsening of the condition.  K wires retained to the head of the first metatarsal from previous capital osteotomy.  There is some osteoarthritic change particular around the sesamoidal area and hypertrophic medial condyle of the first met head.  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  Plan: Injected the dorsal  aspect of the left foot along the tarsometatarsal joints with 10 mg of Kenalog 5 mg Marcaine point of maximal tenderness.  Tolerated the procedure well.  We did discuss starting her on a 7.5 mg meloxicam tablet which she will take once daily twice if needed.  We also discussed surgical intervention regarding removing the K wire and a possible capital osteotomy to allow for a better alignment of that first metatarsal phalangeal joint.     Wister Hoefle T. [place], [place]

## 2023-03-18 ENCOUNTER — Other Ambulatory Visit: Payer: Self-pay | Admitting: Podiatry

## 2023-04-28 ENCOUNTER — Other Ambulatory Visit: Payer: Self-pay | Admitting: Family Medicine

## 2023-04-28 NOTE — Telephone Encounter (Signed)
LVM for patient to c/b and schedule.

## 2023-04-28 NOTE — Telephone Encounter (Signed)
Patient needs CPE scheduled; please call patient to scheduled. Last April patient was supposed to make appt for 3 months and never did.

## 2023-04-28 NOTE — Telephone Encounter (Signed)
Lvmtcb, sent mychart

## 2023-04-29 NOTE — Telephone Encounter (Signed)
Patient scheduled.

## 2023-05-30 ENCOUNTER — Encounter: Payer: Self-pay | Admitting: Family Medicine

## 2023-05-30 ENCOUNTER — Ambulatory Visit: Payer: BC Managed Care – PPO | Admitting: Family Medicine

## 2023-05-30 VITALS — BP 130/79 | HR 73 | Temp 98.4°F | Ht 62.5 in | Wt 195.2 lb

## 2023-05-30 DIAGNOSIS — I10 Essential (primary) hypertension: Secondary | ICD-10-CM

## 2023-05-30 DIAGNOSIS — E119 Type 2 diabetes mellitus without complications: Secondary | ICD-10-CM | POA: Diagnosis not present

## 2023-05-30 DIAGNOSIS — E1169 Type 2 diabetes mellitus with other specified complication: Secondary | ICD-10-CM

## 2023-05-30 DIAGNOSIS — D508 Other iron deficiency anemias: Secondary | ICD-10-CM

## 2023-05-30 DIAGNOSIS — R252 Cramp and spasm: Secondary | ICD-10-CM

## 2023-05-30 DIAGNOSIS — E66812 Obesity, class 2: Secondary | ICD-10-CM

## 2023-05-30 DIAGNOSIS — D518 Other vitamin B12 deficiency anemias: Secondary | ICD-10-CM | POA: Diagnosis not present

## 2023-05-30 DIAGNOSIS — Z23 Encounter for immunization: Secondary | ICD-10-CM

## 2023-05-30 DIAGNOSIS — Z6835 Body mass index (BMI) 35.0-35.9, adult: Secondary | ICD-10-CM

## 2023-05-30 DIAGNOSIS — Z853 Personal history of malignant neoplasm of breast: Secondary | ICD-10-CM | POA: Insufficient documentation

## 2023-05-30 DIAGNOSIS — E785 Hyperlipidemia, unspecified: Secondary | ICD-10-CM

## 2023-05-30 DIAGNOSIS — K6282 Dysplasia of anus: Secondary | ICD-10-CM

## 2023-05-30 DIAGNOSIS — Z0000 Encounter for general adult medical examination without abnormal findings: Secondary | ICD-10-CM

## 2023-05-30 DIAGNOSIS — Z1231 Encounter for screening mammogram for malignant neoplasm of breast: Secondary | ICD-10-CM

## 2023-05-30 NOTE — Progress Notes (Signed)
 Subjective:    Patient ID: [name], female    DOB: Sep 14, 1960, 63 y.o.   MRN: [id number]  HPI  Here for health maintenance exam and to review chronic medical problems   Wt Readings from Last 3 Encounters:  05/30/23 195 lb 4 oz (88.6 kg)  07/03/22 192 lb (87.1 kg)  04/03/22 187 lb 3.2 oz (84.9 kg)   35.14 kg/m  Vitals:   05/30/23 1517 05/30/23 1542  BP: (!) 156/92 130/79  Pulse: 73   Temp: 98.4 F (36.9 C)   SpO2: 99%     Immunization History  Administered Date(s) Administered   Influenza,inj,Quad PF,6+ Mos 12/03/2018, 04/03/2022   Influenza-Unspecified 01/20/2017, 01/27/2018   Moderna Covid-19 Vaccine Bivalent Booster 45yrs & up 02/21/2021   PFIZER Comirnaty(Gray Top)Covid-19 Tri-Sucrose Vaccine 06/12/2019, 07/10/2019   Pneumococcal Polysaccharide-23 12/21/2012   Td 05/30/2023   Tdap 12/21/2012   Zoster Recombinant(Shingrix) 02/24/2021, 04/27/2021    Health Maintenance Due  Topic Date Due   Pneumococcal Vaccine 66-52 Years old (2 of 2 - PCV) 12/21/2013   FOOT EXAM  04/04/2023   Still a little tired since cancer treatment Also works 6 days long hours  Appetite is down also   A lot of muscle cramps in legs and feet at night  Took magnesium in past but gave her diarrhea  No time to do stretching  Short winded since then also   Some sinus drainage  Occational pain in left jaw area    Sleep has never been great / is fragmented  Hot natured as well   Due for tetanus shot   Mammogram-is due  Personal history of breast cancer in 2023  Was treated with lumpectomy and sentinel LN bx  Had radiation  Arimidex was recommended  Self breast exam-no changes   Gyn health Hysterectomy  LEEP prior to that     Colon cancer screening  colonoscopy 07/2017  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 at that time and colonoscopy due 2 y  Father had rectal cancer   Bone health  Dexa 04/2022 -normal  Falls- none  Fractures-none   Supplements -none    Exercise -active job      Mood    05/30/2023    4:07 PM 07/03/2022    8:42 AM 10/09/2021    8:38 AM 12/19/2017    9:44 AM 10/11/2016   10:52 AM  Depression screen PHQ 2/9  Decreased Interest 0 0 0 0 1  Down, Depressed, Hopeless 0 0 0 0 1  PHQ - 2 Score 0 0 0 0 2  Altered sleeping 1 0   1  Tired, decreased energy 1 0   0  Change in appetite 1 0   1  Feeling bad or failure about yourself  0 0   0  Trouble concentrating 0 0   0  Moving slowly or fidgety/restless 0 0   0  Suicidal thoughts 0 0   0  PHQ-9 Score 3 0   4  Difficult doing work/chores Not difficult at all Not difficult at all      HTN bp is stable today  No cp or palpitations or headaches or edema  No side effects to medicines  BP Readings from Last 3 Encounters:  05/30/23 130/79  07/03/22 128/82  04/03/22 138/86   Amlodipine 5 mg daily   Staying 130-140s /80s most of the time   Meloxicam is on med list  Lab Results  Component Value Date  NA 141 05/30/2023   K 4.2 05/30/2023   CO2 22 05/30/2023   GLUCOSE 117 (H) 05/30/2023   BUN 19 05/30/2023   CREATININE 0.90 05/30/2023   CALCIUM 9.4 05/30/2023   GFR 94.41 10/09/2021   GFRNONAA >60 11/19/2021   Due for labs    DM2 Lab Results  Component Value Date   HGBA1C 7.3 (H) 05/30/2023   HGBA1C 6.6 (A) 07/03/2022   HGBA1C 7.9 (A) 04/03/2022   Due for labs  Metformin 1000 mg bid  Glipizide xl 2.5 mg daily   Lab Results  Component Value Date   MICROALBUR 1.5 05/30/2023   MICROALBUR 1.7 10/09/2021       Hyperlipidemia  Lab Results  Component Value Date   CHOL 168 05/30/2023   HDL 44 (L) 05/30/2023   LDLCALC 108 (H) 05/30/2023   TRIG 74 05/30/2023   CHOLHDL 3.8 05/30/2023   Not currently on statin     Patient Active Problem List   Diagnosis Date Noted   History of breast cancer 05/30/2023   Nocturnal muscle cramps 05/30/2023   Class 2 severe obesity due to excess calories with serious comorbidity and body mass index (BMI) of 35.0 to  35.9 in adult (HCC) 07/03/2022   Postmenopausal 12/18/2021   Breast cancer in female [facility]) 11/19/2021   Goals of care, counseling/discussion 11/19/2021   Gastroparesis 08/25/2020   Hair loss 08/25/2020   Anxiety 02/24/2018   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   Essential hypertension 10/11/2016   Screening mammogram, encounter for 08/30/2016   Routine general medical examination at a health care facility 08/30/2016   History of herpes zoster 05/12/2015   Hyperlipidemia associated with type 2 diabetes mellitus (HCC) 12/21/2012   Vaginal atrophy 12/18/2012   Lower back pain 12/04/2011   ANEMIA, B12 DEFICIENCY 10/05/2008   Allergic rhinitis 10/05/2008   Chronic headache 10/05/2008   Type 2 diabetes mellitus without complications (HCC) 01/26/2008   ANEMIA, IRON DEFICIENCY 01/26/2008   Past Medical History:  Diagnosis Date   Allergy    Anal intraepithelial neoplasia II (AIN II)    Arthritis    shoulder, feet   B12 deficiency    Essential hypertension    followed by pcp--- currently no meds,  states when she stopped sinus medication otc , bp better   High frequency hearing loss    History of abnormal Pap smear    History of uterine leiomyoma    Hx of migraine headaches    Iron deficiency anemia    Type 2 diabetes mellitus (HCC)    followed by pcp   Past Surgical History:  Procedure Laterality Date   BREAST LUMPECTOMY WITH SENTINEL LYMPH NODE BIOPSY Right 11/26/2021   Procedure: RIGHT BREAST LUMPECTOMY WITH SENTINEL LYMPH NODE BIOPSY;  Surgeon: Abigail Miyamoto, MD;  Location: [facility] OR;  Service: General;  Laterality: Right;   BUNIONECTOMY Bilateral 1982   CARDIOVASCULAR STRESS TEST  09-19-2010   @ [facility]   normal nuclear study w/ no evidence ishemia/  normal LV funciton and wall motion , ef 81%   COLONOSCOPY WITH ESOPHAGOGASTRODUODENOSCOPY (EGD)  last one 08-21-2017   HIGH RESOLUTION ANOSCOPY N/A 10/09/2017   Procedure: HIGH RESOLUTION ANOSCOPY,  BIOPSY   EXCISION;  Surgeon: Romie Levee, MD;  Location: [facility];  Service: General;  Laterality: N/A;   LAPAROSCOPIC ASSISTED VAGINAL HYSTERECTOMY  09-18-2005    dr Shawnie Pons  [facility]   LEEP     MYOMECTOMY ABDOMINAL APPROACH  09-04-2001    dr  [facility]   w/ lysis adhesions   NASAL ENDOSCOPY  03/2007   neg   TONSILLECTOMY  child   Social History   Tobacco Use   Smoking status: Never    Passive exposure: Never   Smokeless tobacco: Never  Vaping Use   Vaping status: Never Used  Substance Use Topics   Alcohol use: Yes    Alcohol/week: 2.0 standard drinks of alcohol    Types: 2 Glasses of wine per week   Drug use: No   Family History  Problem Relation Age of Onset   Rectal cancer Father    Colon cancer Father    Heart disease Sister    Arthritis Sister    Diabetes Sister    Hypertension Sister    Diabetes Maternal Grandmother    Hypertension Maternal Grandmother    Sudden death Daughter        after giving birth / ? if blood clot   Breast cancer Neg Hx    Esophageal cancer Neg Hx    Stomach cancer Neg Hx    Allergies  Allergen Reactions   Lisinopril Cough   Losartan     Chest pressure    Amoxicillin-Pot Clavulanate Itching and Rash   Current Outpatient Medications on File Prior to Visit  Medication Sig Dispense Refill   amLODipine (NORVASC) 5 MG tablet TAKE 1 TABLET (5 MG TOTAL) BY MOUTH DAILY. 90 tablet 0   cetirizine (ZYRTEC) 10 MG tablet Take 1 tablet (10 mg total) by mouth at bedtime. 90 tablet 3   diclofenac sodium (VOLTAREN) 1 % GEL Apply 4 g topically 4 (four) times daily. 100 g 2   fluticasone (FLONASE) 50 MCG/ACT nasal spray Place 2 sprays into both nostrils daily. 48 g 2   glipiZIDE (GLIPIZIDE XL) 2.5 MG 24 hr tablet Take 1 tablet (2.5 mg total) by mouth daily with breakfast. 90 tablet 3   meloxicam (MOBIC) 7.5 MG tablet TAKE 1 TABLET BY MOUTH EVERY DAY 30 tablet 0   metFORMIN (GLUCOPHAGE) 1000 MG tablet TAKE 1 TABLET (1,000 MG TOTAL) BY MOUTH TWICE A  DAY WITH FOOD 180 tablet 2   No current facility-administered medications on file prior to visit.    Review of Systems  Constitutional:  Positive for fatigue. Negative for activity change, appetite change, fever and unexpected weight change.       Working long hours No time for self care   HENT:  Negative for congestion, ear pain, rhinorrhea, sinus pressure and sore throat.   Eyes:  Negative for pain, redness and visual disturbance.  Respiratory:  Negative for cough, shortness of breath and wheezing.   Cardiovascular:  Negative for chest pain and palpitations.  Gastrointestinal:  Negative for abdominal pain, blood in stool, constipation and diarrhea.  Endocrine: Negative for polydipsia and polyuria.  Genitourinary:  Negative for dysuria, frequency and urgency.  Musculoskeletal:  Negative for arthralgias, back pain and myalgias.       Nocturnal leg cramps   Skin:  Negative for pallor and rash.  Allergic/Immunologic: Negative for environmental allergies.  Neurological:  Negative for dizziness, syncope and headaches.  Hematological:  Negative for adenopathy. Does not bruise/bleed easily.  Psychiatric/Behavioral:  Positive for sleep disturbance. Negative for decreased concentration and dysphoric mood. The patient is not nervous/anxious.        Objective:   Physical Exam Constitutional:      General: She is not in acute distress.    Appearance: Normal appearance. She is well-developed. She is obese. She  is not ill-appearing or diaphoretic.  HENT:     Head: Normocephalic and atraumatic.     Right Ear: Tympanic membrane, ear canal and external ear normal.     Left Ear: Tympanic membrane, ear canal and external ear normal.     Nose: Nose normal. No congestion.     Mouth/Throat:     Mouth: Mucous membranes are moist.     Pharynx: Oropharynx is clear. No posterior oropharyngeal erythema.  Eyes:     General: No scleral icterus.    Extraocular Movements: Extraocular movements intact.      Conjunctiva/sclera: Conjunctivae normal.     Pupils: Pupils are equal, round, and reactive to light.  Neck:     Thyroid: No thyromegaly.     Vascular: No carotid bruit or JVD.  Cardiovascular:     Rate and Rhythm: Normal rate and regular rhythm.     Pulses: Normal pulses.     Heart sounds: Normal heart sounds.     No gallop.  Pulmonary:     Effort: Pulmonary effort is normal. No respiratory distress.     Breath sounds: Normal breath sounds. No wheezing.     Comments: Good air exch Chest:     Chest wall: No tenderness.  Abdominal:     General: Bowel sounds are normal. There is no distension or abdominal bruit.     Palpations: Abdomen is soft. There is no mass.     Tenderness: There is no abdominal tenderness.     Hernia: No hernia is present.  Genitourinary:    Comments: Breast exam: No mass, nodules, thickening, tenderness, bulging, retraction, inflamation, nipple discharge or skin changes noted.  No axillary or clavicular LA.     Scar/surg changes right breast baseline Musculoskeletal:        General: No tenderness. Normal range of motion.     Cervical back: Normal range of motion and neck supple. No rigidity. No muscular tenderness.     Right lower leg: No edema.     Left lower leg: No edema.     Comments: No kyphosis   Lymphadenopathy:     Cervical: No cervical adenopathy.  Skin:    General: Skin is warm and dry.     Coloration: Skin is not pale.     Findings: No erythema or rash.  Neurological:     Mental Status: She is alert. Mental status is at baseline.     Cranial Nerves: No cranial nerve deficit.     Motor: No abnormal muscle tone.     Coordination: Coordination normal.     Gait: Gait normal.     Deep Tendon Reflexes: Reflexes are normal and symmetric. Reflexes normal.  Psychiatric:        Mood and Affect: Mood normal.        Cognition and Memory: Cognition and memory normal.           Assessment & Plan:   Problem List Items Addressed This Visit        Cardiovascular and Mediastinum   Essential hypertension   bp in fair control at this time  BP Readings from Last 1 Encounters:  05/30/23 130/79   No changes needed Most recent labs reviewed  Disc lifstyle change with low sodium diet and exercise  Taking amlodipine 5 mg daily       Relevant Orders   CBC with Differential/Platelet (Completed)   Comprehensive metabolic panel (Completed)   TSH (Completed)   Lipid Panel (Completed)  Digestive   AIN (anal intraepithelial neoplasia) anal canal   In 2019  No issues  Colonoscopy due 2029        Endocrine   Type 2 diabetes mellitus without complications (HCC)   A1c ordered   Metformin 1000 mg bid Glipizide xl 2.5 mg daily  Microalb ordered       Relevant Orders   Hemoglobin A1c (Completed)   Microalbumin / creatinine urine ratio (Completed)   Hyperlipidemia associated with type 2 diabetes mellitus (HCC)   Disc goals for lipids and reasons to control them Rev last labs with pt Rev low sat fat diet in detail Labs ordered  Not currently taking a statin       Relevant Orders   Lipid Panel (Completed)     Other   Screening mammogram, encounter for   Mammogram ordered Personal history of breast cancer       Relevant Orders   MM 3D SCREENING MAMMOGRAM BILATERAL BREAST   Routine general medical examination at a health care facility - Primary   Reviewed health habits including diet and exercise and skin cancer prevention Reviewed appropriate screening tests for age  Also reviewed health mt list, fam hx and immunization status , as well as social and family history   See HPI Labs reviewed and ordered Health Maintenance  Topic Date Due   Pneumococcal Vaccination (2 of 2 - PCV) 12/21/2013   Complete foot exam   04/04/2023   Flu Shot  06/30/2023*   COVID-19 Vaccine (5 - 2024-25 season) 06/14/2024*   Eye exam for diabetics  08/21/2023   Mammogram  10/25/2023   Hemoglobin A1C  11/27/2023   Yearly kidney function  blood test for diabetes  05/29/2024   Yearly kidney health urinalysis for diabetes  05/29/2024   Colon Cancer Screening  08/22/2027   DTaP/Tdap/Td vaccine (3 - Td or Tdap) 05/29/2033   Hepatitis C Screening  Completed   HIV Screening  Completed   Zoster (Shingles) Vaccine  Completed   HPV Vaccine  Aged Out  *Topic was postponed. The date shown is not the original due date.   Mammo ordered Td given  S/p hysterectomy  Normal dexa 04/2022  Discussed fall prevention, supplements and exercise for bone density  PHQ 3 - due to sleep issues       Nocturnal muscle cramps   Has tried magnesium -caused diarrhea (? If may be able to try with ca)  Has tried mustard  Encouraged to stretch/ pt does not have time        Relevant Orders   Comprehensive metabolic panel (Completed)   Magnesium (Completed)   History of breast cancer   Mammogram due for screening -ordered  Was treatment with lumpectomy /sentenel Ln in 2023  Declined arimidex due to side effects       Class 2 severe obesity due to excess calories with serious comorbidity and body mass index (BMI) of 35.0 to 35.9 in adult (HCC)   Bmi 35.1 Discussed how this problem influences overall health and the risks it imposes  Reviewed plan for weight loss with lower calorie diet (via better food choices (lower glycemic and portion control) along with exercise building up to or more than 30 minutes 5 days per week including some aerobic activity and strength training         ANEMIA, IRON DEFICIENCY   Labs today Supplemented in the past      Relevant Orders   Iron (Completed)   ANEMIA, B12 DEFICIENCY  B12 level today        Relevant Orders   CBC with Differential/Platelet (Completed)   Vitamin B12 (Completed)   Other Visit Diagnoses       Need for Td vaccine       Relevant Orders   Td : Tetanus/diphtheria >7yo Preservative  free (Completed)

## 2023-05-30 NOTE — Patient Instructions (Addendum)
 Try to get 1200-1500 mg of calcium per day with at least 2000 iu of vitamin D - for bone health  Let us know how sinus symptoms are next week  Watch for green/yellow mucous   Tetanus shot (Td) today   Stretching may be the most effective thing for cramps  Labs today    You have an order for:  []   2D Mammogram  [x]   3D Mammogram  []   Bone Density     Please call for appointment:   [x]   [facility]  [address] [place] [address]  [phone]  []   [facility])   [address]  [place], [place] [address]  [phone]  []   [facility]      [address] [place], [place]        [phone]         []   [facility]  [address] [place], [place]  [phone]  []  [facility]   [address]   [place], [place] [address]  [phone]  []  [facility]  [address] [place], [place] [address] [phone]    Make sure to wear two piece clothing  No lotions powders or deodorants the day of the appointment Make sure to bring picture ID and insurance card.  Bring list of medications you are currently taking including any supplements.   Schedule your screening mammogram through MyChart!   Select [place] imaging sites can now be scheduled through MyChart.  Log into your MyChart account.  Go to 'Visit' (or 'Appointments' if  on mobile App) --> Schedule an  Appointment  Under 'Select a Reason for Visit' choose the Mammogram  Screening option.  Complete the pre-visit questions  and select the time and place that  best fits your schedule

## 2023-05-31 LAB — COMPREHENSIVE METABOLIC PANEL
AG Ratio: 1.8 (calc) (ref 1.0–2.5)
ALT: 13 U/L (ref 6–29)
AST: 17 U/L (ref 10–35)
Albumin: 4.4 g/dL (ref 3.6–5.1)
Alkaline phosphatase (APISO): 64 U/L (ref 37–153)
BUN: 19 mg/dL (ref 7–25)
CO2: 22 mmol/L (ref 20–32)
Calcium: 9.4 mg/dL (ref 8.6–10.4)
Chloride: 107 mmol/L (ref 98–110)
Creat: 0.9 mg/dL (ref 0.50–1.05)
Globulin: 2.5 g/dL (ref 1.9–3.7)
Glucose, Bld: 117 mg/dL — ABNORMAL HIGH (ref 65–99)
Potassium: 4.2 mmol/L (ref 3.5–5.3)
Sodium: 141 mmol/L (ref 135–146)
Total Bilirubin: 0.2 mg/dL (ref 0.2–1.2)
Total Protein: 6.9 g/dL (ref 6.1–8.1)

## 2023-05-31 LAB — CBC WITH DIFFERENTIAL/PLATELET
Absolute Lymphocytes: 1163 {cells}/uL (ref 850–3900)
Absolute Monocytes: 357 {cells}/uL (ref 200–950)
Basophils Absolute: 10 {cells}/uL (ref 0–200)
Basophils Relative: 0.3 %
Eosinophils Absolute: 68 {cells}/uL (ref 15–500)
Eosinophils Relative: 2 %
HCT: 35.5 % (ref 35.0–45.0)
Hemoglobin: 11.3 g/dL — ABNORMAL LOW (ref 11.7–15.5)
MCH: 25.6 pg — ABNORMAL LOW (ref 27.0–33.0)
MCHC: 31.8 g/dL — ABNORMAL LOW (ref 32.0–36.0)
MCV: 80.3 fL (ref 80.0–100.0)
MPV: 12.9 fL — ABNORMAL HIGH (ref 7.5–12.5)
Monocytes Relative: 10.5 %
Neutro Abs: 1802 {cells}/uL (ref 1500–7800)
Neutrophils Relative %: 53 %
Platelets: 165 10*3/uL (ref 140–400)
RBC: 4.42 10*6/uL (ref 3.80–5.10)
RDW: 15.3 % — ABNORMAL HIGH (ref 11.0–15.0)
Total Lymphocyte: 34.2 %
WBC: 3.4 10*3/uL — ABNORMAL LOW (ref 3.8–10.8)

## 2023-05-31 LAB — LIPID PANEL
Cholesterol: 168 mg/dL (ref <200)
HDL: 44 mg/dL — ABNORMAL LOW (ref >=50)
LDL Cholesterol (Calc): 108 mg/dL — ABNORMAL HIGH
Non-HDL Cholesterol (Calc): 124 mg/dL (ref <130)
Total CHOL/HDL Ratio: 3.8 (calc) (ref <5.0)
Triglycerides: 74 mg/dL (ref <150)

## 2023-05-31 LAB — MICROALBUMIN / CREATININE URINE RATIO
Creatinine, Urine: 185 mg/dL (ref 20–275)
Microalb Creat Ratio: 8 mg/g{creat} (ref <30)
Microalb, Ur: 1.5 mg/dL

## 2023-05-31 LAB — HEMOGLOBIN A1C
Hgb A1c MFr Bld: 7.3 %{Hb} — ABNORMAL HIGH (ref <5.7)
Mean Plasma Glucose: 163 mg/dL
eAG (mmol/L): 9 mmol/L

## 2023-05-31 LAB — TSH: TSH: 1.13 m[IU]/L (ref 0.40–4.50)

## 2023-05-31 LAB — VITAMIN B12: Vitamin B-12: 450 pg/mL (ref 200–1100)

## 2023-05-31 LAB — MAGNESIUM: Magnesium: 1.8 mg/dL (ref 1.5–2.5)

## 2023-05-31 LAB — IRON: Iron: 35 ug/dL — ABNORMAL LOW (ref 45–160)

## 2023-06-01 ENCOUNTER — Encounter: Payer: Self-pay | Admitting: Family Medicine

## 2023-06-01 NOTE — Assessment & Plan Note (Signed)
 A1c ordered   Metformin 1000 mg bid Glipizide xl 2.5 mg daily  Microalb ordered

## 2023-06-01 NOTE — Assessment & Plan Note (Signed)
 In 2019  No issues  Colonoscopy due 2029

## 2023-06-01 NOTE — Assessment & Plan Note (Signed)
 Reviewed health habits including diet and exercise and skin cancer prevention Reviewed appropriate screening tests for age  Also reviewed health mt list, fam hx and immunization status , as well as social and family history   See HPI Labs reviewed and ordered Health Maintenance  Topic Date Due   Pneumococcal Vaccination (2 of 2 - PCV) 12/21/2013   Complete foot exam   04/04/2023   Flu Shot  06/30/2023*   COVID-19 Vaccine (5 - 2024-25 season) 06/14/2024*   Eye exam for diabetics  08/21/2023   Mammogram  10/25/2023   Hemoglobin A1C  11/27/2023   Yearly kidney function blood test for diabetes  05/29/2024   Yearly kidney health urinalysis for diabetes  05/29/2024   Colon Cancer Screening  08/22/2027   DTaP/Tdap/Td vaccine (3 - Td or Tdap) 05/29/2033   Hepatitis C Screening  Completed   HIV Screening  Completed   Zoster (Shingles) Vaccine  Completed   HPV Vaccine  Aged Out  *Topic was postponed. The date shown is not the original due date.   Mammo ordered Td given  S/p hysterectomy  Normal dexa 04/2022  Discussed fall prevention, supplements and exercise for bone density  PHQ 3 - due to sleep issues

## 2023-06-01 NOTE — Assessment & Plan Note (Signed)
 Disc goals for lipids and reasons to control them Rev last labs with pt Rev low sat fat diet in detail Labs ordered  Not currently taking a statin

## 2023-06-01 NOTE — Assessment & Plan Note (Signed)
 Bmi 35.1 Discussed how this problem influences overall health and the risks it imposes  Reviewed plan for weight loss with lower calorie diet (via better food choices (lower glycemic and portion control) along with exercise building up to or more than 30 minutes 5 days per week including some aerobic activity and strength training

## 2023-06-01 NOTE — Assessment & Plan Note (Signed)
 Mammogram ordered Personal history of breast cancer

## 2023-06-01 NOTE — Assessment & Plan Note (Signed)
 Labs today Supplemented in the past

## 2023-06-01 NOTE — Assessment & Plan Note (Signed)
 Has tried magnesium -caused diarrhea (? If may be able to try with ca)  Has tried mustard  Encouraged to stretch/ pt does not have time

## 2023-06-01 NOTE — Assessment & Plan Note (Signed)
 Mammogram due for screening -ordered  Was treatment with lumpectomy /sentenel Ln in 2023  Declined arimidex due to side effects

## 2023-06-01 NOTE — Assessment & Plan Note (Signed)
 bp in fair control at this time  BP Readings from Last 1 Encounters:  05/30/23 130/79   No changes needed Most recent labs reviewed  Disc lifstyle change with low sodium diet and exercise  Taking amlodipine 5 mg daily

## 2023-06-01 NOTE — Assessment & Plan Note (Signed)
 B12 level today

## 2023-06-02 ENCOUNTER — Other Ambulatory Visit: Payer: Self-pay | Admitting: Family Medicine

## 2023-06-09 ENCOUNTER — Other Ambulatory Visit: Payer: Self-pay

## 2023-06-09 MED ORDER — POLYSACCHARIDE IRON COMPLEX 150 MG PO CAPS
150.0000 mg | ORAL_CAPSULE | Freq: Every day | ORAL | 1 refills | Status: AC
Start: 2023-06-09 — End: ?

## 2023-07-11 ENCOUNTER — Ambulatory Visit: Admitting: Family Medicine

## 2023-07-20 ENCOUNTER — Other Ambulatory Visit: Payer: Self-pay | Admitting: Family Medicine

## 2023-07-28 ENCOUNTER — Other Ambulatory Visit: Payer: Self-pay | Admitting: Family Medicine

## 2024-03-25 ENCOUNTER — Other Ambulatory Visit: Payer: Self-pay | Admitting: Family Medicine

## 2024-04-04 ENCOUNTER — Other Ambulatory Visit: Payer: Self-pay | Admitting: Family Medicine
